# Patient Record
Sex: Female | Born: 1955 | Race: White | Hispanic: No | Marital: Married | Smoking: Former smoker
Health system: Southern US, Community
[De-identification: ages and names within clinical notes are randomized; demographics above are authoritative.]

## PROBLEM LIST (undated history)

## (undated) DIAGNOSIS — G43109 Migraine with aura, not intractable, without status migrainosus: Secondary | ICD-10-CM

## (undated) DIAGNOSIS — K219 Gastro-esophageal reflux disease without esophagitis: Secondary | ICD-10-CM

## (undated) DIAGNOSIS — G4734 Idiopathic sleep related nonobstructive alveolar hypoventilation: Secondary | ICD-10-CM

## (undated) DIAGNOSIS — I10 Essential (primary) hypertension: Secondary | ICD-10-CM

## (undated) DIAGNOSIS — F419 Anxiety disorder, unspecified: Secondary | ICD-10-CM

## (undated) DIAGNOSIS — T8859XA Other complications of anesthesia, initial encounter: Secondary | ICD-10-CM

## (undated) DIAGNOSIS — R112 Nausea with vomiting, unspecified: Secondary | ICD-10-CM

## (undated) DIAGNOSIS — N189 Chronic kidney disease, unspecified: Secondary | ICD-10-CM

## (undated) DIAGNOSIS — M419 Scoliosis, unspecified: Secondary | ICD-10-CM

## (undated) DIAGNOSIS — T7840XA Allergy, unspecified, initial encounter: Secondary | ICD-10-CM

## (undated) DIAGNOSIS — F329 Major depressive disorder, single episode, unspecified: Secondary | ICD-10-CM

## (undated) DIAGNOSIS — I701 Atherosclerosis of renal artery: Secondary | ICD-10-CM

## (undated) DIAGNOSIS — G4730 Sleep apnea, unspecified: Secondary | ICD-10-CM

## (undated) DIAGNOSIS — M4800 Spinal stenosis, site unspecified: Secondary | ICD-10-CM

## (undated) DIAGNOSIS — M1990 Unspecified osteoarthritis, unspecified site: Secondary | ICD-10-CM

## (undated) DIAGNOSIS — E785 Hyperlipidemia, unspecified: Secondary | ICD-10-CM

## (undated) DIAGNOSIS — H269 Unspecified cataract: Secondary | ICD-10-CM

## (undated) DIAGNOSIS — Z9889 Other specified postprocedural states: Secondary | ICD-10-CM

## (undated) DIAGNOSIS — G709 Myoneural disorder, unspecified: Secondary | ICD-10-CM

## (undated) HISTORY — DX: Essential (primary) hypertension: I10

## (undated) HISTORY — DX: Major depressive disorder, single episode, unspecified: F32.9

## (undated) HISTORY — DX: Gastro-esophageal reflux disease without esophagitis: K21.9

## (undated) HISTORY — DX: Sleep apnea, unspecified: G47.30

## (undated) HISTORY — DX: Unspecified cataract: H26.9

## (undated) HISTORY — DX: Idiopathic sleep related nonobstructive alveolar hypoventilation: G47.34

## (undated) HISTORY — DX: Unspecified osteoarthritis, unspecified site: M19.90

## (undated) HISTORY — DX: Atherosclerosis of renal artery: I70.1

## (undated) HISTORY — DX: Spinal stenosis, site unspecified: M48.00

## (undated) HISTORY — DX: Anxiety disorder, unspecified: F41.9

## (undated) HISTORY — DX: Allergy, unspecified, initial encounter: T78.40XA

## (undated) HISTORY — PX: CHOLECYSTECTOMY: SHX55

## (undated) HISTORY — PX: CATARACT EXTRACTION: SUR2

## (undated) HISTORY — DX: Chronic kidney disease, unspecified: N18.9

## (undated) HISTORY — DX: Hyperlipidemia, unspecified: E78.5

## (undated) HISTORY — PX: BUNIONECTOMY: SHX129

## (undated) HISTORY — DX: Migraine with aura, not intractable, without status migrainosus: G43.109

## (undated) HISTORY — PX: EYE SURGERY: SHX253

## (undated) HISTORY — PX: TUBAL LIGATION: SHX77

## (undated) HISTORY — DX: Myoneural disorder, unspecified: G70.9

---

## 2019-10-13 ENCOUNTER — Encounter: Payer: Self-pay | Admitting: Internal Medicine

## 2019-11-27 NOTE — Progress Notes (Deleted)
Referring Provider: Marylynn Pearson, FNP Primary Care Physician:  Pearson Grippe, MD Primary Gastroenterologist:  Dr. Bonnetta Barry chief complaint on file.   HPI:   Erica Gallegos is a 64 y.o. female presenting today at the request of Marylynn Pearson, FNP for esophageal stricture. No past medical history on file.  *** The histories are not reviewed yet. Please review them in the "History" navigator section and refresh this SmartLink.  No current outpatient medications on file.   No current facility-administered medications for this visit.    Allergies as of 11/29/2019  . (Not on File)    No family history on file.  Social History   Socioeconomic History  . Marital status: Not on file    Spouse name: Not on file  . Number of children: Not on file  . Years of education: Not on file  . Highest education level: Not on file  Occupational History  . Not on file  Tobacco Use  . Smoking status: Not on file  Substance and Sexual Activity  . Alcohol use: Not on file  . Drug use: Not on file  . Sexual activity: Not on file  Other Topics Concern  . Not on file  Social History Narrative  . Not on file   Social Determinants of Health   Financial Resource Strain:   . Difficulty of Paying Living Expenses: Not on file  Food Insecurity:   . Worried About Programme researcher, broadcasting/film/video in the Last Year: Not on file  . Ran Out of Food in the Last Year: Not on file  Transportation Needs:   . Lack of Transportation (Medical): Not on file  . Lack of Transportation (Non-Medical): Not on file  Physical Activity:   . Days of Exercise per Week: Not on file  . Minutes of Exercise per Session: Not on file  Stress:   . Feeling of Stress : Not on file  Social Connections:   . Frequency of Communication with Friends and Family: Not on file  . Frequency of Social Gatherings with Friends and Family: Not on file  . Attends Religious Services: Not on file  . Active Member of Clubs or Organizations: Not  on file  . Attends Banker Meetings: Not on file  . Marital Status: Not on file  Intimate Partner Violence:   . Fear of Current or Ex-Partner: Not on file  . Emotionally Abused: Not on file  . Physically Abused: Not on file  . Sexually Abused: Not on file    Review of Systems: Gen: Denies any fever, chills, fatigue, weight loss, lack of appetite.  CV: Denies chest pain, heart palpitations, peripheral edema, syncope.  Resp: Denies shortness of breath at rest or with exertion. Denies wheezing or cough.  GI: Denies dysphagia or odynophagia. Denies jaundice, hematemesis, fecal incontinence. GU : Denies urinary burning, urinary frequency, urinary hesitancy MS: Denies joint pain, muscle weakness, cramps, or limitation of movement.  Derm: Denies rash, itching, dry skin Psych: Denies depression, anxiety, memory loss, and confusion Heme: Denies bruising, bleeding, and enlarged lymph nodes.  Physical Exam: There were no vitals taken for this visit. General:   Alert and oriented. Pleasant and cooperative. Well-nourished and well-developed.  Head:  Normocephalic and atraumatic. Eyes:  Without icterus, sclera clear and conjunctiva pink.  Ears:  Normal auditory acuity. Nose:  No deformity, discharge,  or lesions. Mouth:  No deformity or lesions, oral mucosa pink.  Neck:  Supple, without mass or thyromegaly. Lungs:  Clear  to auscultation bilaterally. No wheezes, rales, or rhonchi. No distress.  Heart:  S1, S2 present without murmurs appreciated.  Abdomen:  +BS, soft, non-tender and non-distended. No HSM noted. No guarding or rebound. No masses appreciated.  Rectal:  Deferred  Msk:  Symmetrical without gross deformities. Normal posture. Pulses:  Normal pulses noted. Extremities:  Without clubbing or edema. Neurologic:  Alert and  oriented x4;  grossly normal neurologically. Skin:  Intact without significant lesions or rashes. Cervical Nodes:  No significant cervical  adenopathy. Psych:  Alert and cooperative. Normal mood and affect.

## 2019-11-29 ENCOUNTER — Encounter: Payer: Self-pay | Admitting: Gastroenterology

## 2019-11-29 ENCOUNTER — Ambulatory Visit: Payer: Self-pay | Admitting: Gastroenterology

## 2020-01-19 DIAGNOSIS — F4310 Post-traumatic stress disorder, unspecified: Secondary | ICD-10-CM | POA: Diagnosis not present

## 2020-01-19 DIAGNOSIS — Z1159 Encounter for screening for other viral diseases: Secondary | ICD-10-CM | POA: Diagnosis not present

## 2020-01-19 DIAGNOSIS — F419 Anxiety disorder, unspecified: Secondary | ICD-10-CM | POA: Diagnosis not present

## 2020-01-19 DIAGNOSIS — Z1152 Encounter for screening for COVID-19: Secondary | ICD-10-CM | POA: Diagnosis not present

## 2020-01-21 ENCOUNTER — Ambulatory Visit: Payer: Self-pay | Admitting: Gastroenterology

## 2020-02-17 DIAGNOSIS — Z79899 Other long term (current) drug therapy: Secondary | ICD-10-CM | POA: Diagnosis not present

## 2020-02-24 DIAGNOSIS — Z79899 Other long term (current) drug therapy: Secondary | ICD-10-CM | POA: Diagnosis not present

## 2020-02-24 DIAGNOSIS — I10 Essential (primary) hypertension: Secondary | ICD-10-CM | POA: Diagnosis not present

## 2020-02-24 DIAGNOSIS — E559 Vitamin D deficiency, unspecified: Secondary | ICD-10-CM | POA: Diagnosis not present

## 2020-02-24 DIAGNOSIS — E7849 Other hyperlipidemia: Secondary | ICD-10-CM | POA: Diagnosis not present

## 2020-02-29 DIAGNOSIS — Z79899 Other long term (current) drug therapy: Secondary | ICD-10-CM | POA: Diagnosis not present

## 2020-02-29 DIAGNOSIS — F331 Major depressive disorder, recurrent, moderate: Secondary | ICD-10-CM | POA: Diagnosis not present

## 2020-02-29 DIAGNOSIS — I10 Essential (primary) hypertension: Secondary | ICD-10-CM | POA: Diagnosis not present

## 2020-02-29 DIAGNOSIS — F419 Anxiety disorder, unspecified: Secondary | ICD-10-CM | POA: Diagnosis not present

## 2020-03-01 NOTE — Progress Notes (Signed)
Referring Provider: Marylynn Pearson, FNP Primary Care Physician:  Pearson Grippe, MD Primary Gastroenterologist:  Dr. Jena Gauss  Chief Complaint  Patient presents with  . Dysphagia    Last EGD approx 10 years ago. Feels like throat is closing up-happening more often    HPI:   Erica Gallegos is a 65 y.o. female presenting today at the request of Marylynn Pearson, FNP for esophageal stricture.  Today:  Feels her throat is closing up. Trouble with swallowing x1 year, but worsening. When eating or drinking, sometimes, items will get stuck at the sternal notch. With time, items will pass. Usually takes about 30 seconds.  No regurgitation. Can occur a couple times a week. No GERD symptoms on Prevacid daily.   No abdominal pain. Little nausea daily x1 month. No identified trigger. Starts around lunch time. Last few a few minutes then it resolves on its own. May come back later in the day. Sometimes improves with eating. Has coffee for breakfast. Takes tylenol for migraines, but also takes either 2 aspirin or 2 ibuprofen a few times a week. Also takes BC powders. Hasn't established with anyone to manage migraines since moving from [place] recently.  She was seeing a neurologist.    EGD about 10 years ago in [place]. Can't remember if she had a dilation. No known history of H. Pylori or PUD. Had a colonoscopy that was normal around 2019. [employer], Dr. Cleotilde Neer.   Reports history of IBS with constipation. BMs every 4-5 days. Doesn't take anything. Not interested in medications. No blood in the stools or black stools. No unintentional weight loss.   BP is elevated. Has a BO cuff at home she unpacked. Hasn't taken medications this morning. Vision is a little blurry with floaters. This happens when she is getting a migraine. No HA at this time. No CP or SOB. Wants to take her medications and see if BP improves. Doesn't want to go to the hospital. Just had an increased in hydrochlorothiazide  yesterday. BP was 170/110 yesterday at PCPs office. Repeat BP 146/90 today.   Past Medical History:  Diagnosis Date  . Anxiety   . GERD (gastroesophageal reflux disease)   . HLD (hyperlipidemia)   . HTN (hypertension)   . Major depressive disorder   . Migraine with aura   . RAS (renal artery stenosis) (HCC)   . Sleep apnea   . Sleep related hypoxia   . Spinal stenosis     Past Surgical History:  Procedure Laterality Date  . CHOLECYSTECTOMY      Current Outpatient Medications  Medication Sig Dispense Refill  . atorvastatin (LIPITOR) 40 MG tablet Take 40 mg by mouth daily.    Marland Kitchen buPROPion (WELLBUTRIN SR) 200 MG 12 hr tablet Take 200 mg by mouth 2 (two) times daily.    . carvedilol (COREG) 25 MG tablet Take 25 mg by mouth 2 (two) times daily.    . clonazePAM (KLONOPIN) 0.5 MG tablet Take 0.5 mg by mouth daily as needed.    . cyclobenzaprine (FLEXERIL) 10 MG tablet Take 10 mg by mouth at bedtime as needed.    . DULoxetine (CYMBALTA) 30 MG capsule Take 30 mg by mouth daily. Takes 30mg  + 60mg  to equal 90mg     . DULoxetine (CYMBALTA) 60 MG capsule Take 60 mg by mouth daily. Takes 30mg  + 60mg  to equal 90mg     . gabapentin (NEURONTIN) 300 MG capsule Take 1-2 capsules by mouth 3 (three) times daily. 1 capsule in morning, 1 capsule  at noon, 2 capsules at night    . hydrochlorothiazide (HYDRODIURIL) 25 MG tablet Take 50 mg by mouth daily.    Marland Kitchen lamoTRIgine (LAMICTAL) 25 MG tablet Take 50 mg by mouth every morning.    . lansoprazole (PREVACID) 30 MG capsule Take 1 capsule (30 mg total) by mouth 2 (two) times daily before a meal. 60 capsule 3  . lisinopril (ZESTRIL) 40 MG tablet Take 40 mg by mouth daily.    . melatonin 5 MG TABS Take 5 mg by mouth at bedtime as needed.    . Omega-3 Fatty Acids (FISH OIL) 1000 MG CAPS Take 1 capsule by mouth daily.    . Vitamin D, Ergocalciferol, (DRISDOL) 1.25 MG (50000 UNIT) CAPS capsule Take 50,000 Units by mouth once a week.     No current  facility-administered medications for this visit.    Allergies as of 03/02/2020  . (No Known Allergies)    Family History  Problem Relation Age of Onset  . Colon cancer Neg Hx     Social History   Socioeconomic History  . Marital status: Married    Spouse name: Not on file  . Number of children: Not on file  . Years of education: Not on file  . Highest education level: Not on file  Occupational History  . Not on file  Tobacco Use  . Smoking status: Current Some Day Smoker  . Smokeless tobacco: Never Used  . Tobacco comment: vapes occ  Substance and Sexual Activity  . Alcohol use: Yes    Comment: seldom  . Drug use: Yes    Types: Marijuana    Comment: CBD cream; Marijuana a couple times a week.   Marland Kitchen Sexual activity: Not on file  Other Topics Concern  . Not on file  Social History Narrative  . Not on file   Social Determinants of Health   Financial Resource Strain: Not on file  Food Insecurity: Not on file  Transportation Needs: Not on file  Physical Activity: Not on file  Stress: Not on file  Social Connections: Not on file  Intimate Partner Violence: Not on file    Review of Systems: Gen: Denies any fever, chills, cold or flulike symptoms, lightheadedness, dizziness, presyncope, syncope. CV: See HPI Resp: See HPI GI: See HPI GU : Denies urinary burning, urinary frequency, urinary hesitancy MS: Denies joint pain Derm: Denies rash Psych: Admits to history of anxiety and depression, currently managed with medications. Heme: See HPI  Physical Exam: BP (!) 146/90 (BP Location: Right Arm)   Pulse 67   Temp (!) 96.8 F (36 C) (Temporal)   Ht 5\' 5"  (1.651 m)   Wt 179 lb (81.2 kg)   BMI 29.79 kg/m  General:   Alert and oriented. Pleasant and cooperative. Well-nourished and well-developed.  Head:  Normocephalic and atraumatic. Eyes:  Without icterus, sclera clear and conjunctiva pink.  Ears:  Normal auditory acuity. Lungs:  Clear to auscultation  bilaterally. No wheezes, rales, or rhonchi. No distress.  Heart:  S1, S2 present without murmurs appreciated.  Abdomen:  +BS, soft, non-tender and non-distended. No HSM noted. No guarding or rebound. No masses appreciated.  Rectal:  Deferred  Msk:  Symmetrical without gross deformities. Normal posture. Extremities:  With 1+ bilateral lower extremity pitting edema. Neurologic:  Alert and  oriented x4;  grossly normal neurologically. Skin:  Intact without significant lesions or rashes. Psych: Normal mood and affect.  Assessment: 65 year old female recently moved from 70 with history of HTN,  HLD, RAS, sleep apnea, anxiety/depression, constipation, GERD, and regular marijuana use presenting today with complaint of dysphagia and nausea without vomiting.  Also with significantly elevated blood pressure at 207/102 on presentation.  Dysphagia: 1 year history of intermittent food or liquid dysphagia that is becoming more frequent.  Feels items get stuck at the sternal notch, but will pass with time.  No regurgitation.  History of GERD that is well controlled on Prevacid daily.  She does consume several NSAID products throughout the week due to history of migraines including aspirin, ibuprofen, and BC powders.  Differentials include esophagitis secondary to NSAIDs, esophageal web, ring, or stricture less likely malignancy. She is going to need EGD, but would like to see that her blood pressure is under better control prior to scheduling.   Nausea without vomiting: Mild daily nausea without vomiting x1 month without identified trigger.  GERD well controlled on Prevacid daily.  She consumes several NSAID products throughout the week due to migraines.  Differentials include gastritis, duodenitis, PUD, H. Pylori, and atypical GERD..  HTN: Blood pressure significantly elevated at 207/102 on presentation.  Patient reports she has not taken her medications this morning and just saw her PCP yesterday to  adjust blood pressure medications as her blood pressure has been persistently high.  States it was 170/110 yesterday.  Her hydrochlorothiazide was doubled.  On recheck just before patient left the office, BP was 146/90.  That she keep a close check on her blood pressure at home and contact PCP if her blood pressure continues to be elevated.  We are looking at scheduling EGD in the near future.  We will have her come back in 2 weeks for blood pressure recheck to see if medication change has helped stabilize HTN.  Plan:  1.  Increase Prevacid to 30 mg twice daily. 2.  Avoid all NSAID products. 3.  Discuss management of migraines with primary care while she is trying to find a neurologist. 4.  Take blood pressure medications as prescribed. 5.  Check blood pressure at home daily and contact PCP if persistent elevation. 6.  Advised to return to our office in 2 weeks for blood pressure recheck.   7.  If blood pressure is under better control with recent medication adjustment, we will proceed with EGD with propofol with Dr. Jena Gauss. ASA II. 8.  Advised to eat slowly, take small bites, chew thoroughly, drink plenty of liquids throughout her meals. 9.  Discussed ED precautions regarding HTN and dysphagia.

## 2020-03-02 ENCOUNTER — Other Ambulatory Visit: Payer: Self-pay

## 2020-03-02 ENCOUNTER — Ambulatory Visit (INDEPENDENT_AMBULATORY_CARE_PROVIDER_SITE_OTHER): Payer: BC Managed Care – PPO | Admitting: Gastroenterology

## 2020-03-02 ENCOUNTER — Encounter: Payer: Self-pay | Admitting: Gastroenterology

## 2020-03-02 VITALS — BP 146/90 | HR 67 | Temp 96.8°F | Ht 65.0 in | Wt 179.0 lb

## 2020-03-02 DIAGNOSIS — R110 Nausea: Secondary | ICD-10-CM | POA: Diagnosis not present

## 2020-03-02 DIAGNOSIS — R1310 Dysphagia, unspecified: Secondary | ICD-10-CM | POA: Insufficient documentation

## 2020-03-02 DIAGNOSIS — I10 Essential (primary) hypertension: Secondary | ICD-10-CM | POA: Diagnosis not present

## 2020-03-02 DIAGNOSIS — K219 Gastro-esophageal reflux disease without esophagitis: Secondary | ICD-10-CM | POA: Insufficient documentation

## 2020-03-02 MED ORDER — LANSOPRAZOLE 30 MG PO CPDR: 30.0000 mg | DELAYED_RELEASE_CAPSULE | Freq: Two times a day (BID) | ORAL | 3 refills | Status: DC

## 2020-03-02 NOTE — Patient Instructions (Addendum)
Please check your blood pressure in 1 hour at home and if your blood pressure remains elevated over 180/100, please proceed to the emergency room.   If you develop any chest pain, shortness of breath, worsening blurry vision, change in mental status, or headache, you need to proceed to the emergency room.  Continue to follow closely with your primary care for elevated blood pressure.  Increase Prevacid to 30 mg twice daily 30 minutes before breakfast and dinner.  I have sent a prescription to your pharmacy.  Please avoid all NSAID products including ibuprofen, Aleve, Advil, Goody powders, BC powders, naproxen, and anything that says "NSAID" on the package.  Discussed management of migraines with primary care for now while you are trying to find a neurologist.  For trouble swallowing, eat slowly, take small bites, chew thoroughly, and drink plenty of liquids throughout your meals.  If anything gets stuck in your esophagus and will not come up or go down after a couple of hours, you should proceed to the emergency room.  We will hope to schedule you for an EGD in the near future.  Please come back to our office in 2 weeks for a nurse visit to recheck your blood pressure.  If blood pressure is improved, we will proceed with EGD.  It was nice to meet you today!  Ermalinda Memos, PA-C [facility]

## 2020-03-08 DIAGNOSIS — I10 Essential (primary) hypertension: Secondary | ICD-10-CM | POA: Diagnosis not present

## 2020-03-08 DIAGNOSIS — Z20822 Contact with and (suspected) exposure to covid-19: Secondary | ICD-10-CM | POA: Diagnosis not present

## 2020-03-21 DIAGNOSIS — F331 Major depressive disorder, recurrent, moderate: Secondary | ICD-10-CM | POA: Diagnosis not present

## 2020-03-21 DIAGNOSIS — F4310 Post-traumatic stress disorder, unspecified: Secondary | ICD-10-CM | POA: Diagnosis not present

## 2020-03-21 DIAGNOSIS — F419 Anxiety disorder, unspecified: Secondary | ICD-10-CM | POA: Diagnosis not present

## 2020-03-21 DIAGNOSIS — I10 Essential (primary) hypertension: Secondary | ICD-10-CM | POA: Diagnosis not present

## 2020-05-02 DIAGNOSIS — R0602 Shortness of breath: Secondary | ICD-10-CM | POA: Diagnosis not present

## 2020-05-02 DIAGNOSIS — F419 Anxiety disorder, unspecified: Secondary | ICD-10-CM | POA: Diagnosis not present

## 2020-05-02 DIAGNOSIS — I10 Essential (primary) hypertension: Secondary | ICD-10-CM | POA: Diagnosis not present

## 2020-05-02 DIAGNOSIS — R600 Localized edema: Secondary | ICD-10-CM | POA: Diagnosis not present

## 2020-05-09 DIAGNOSIS — F4310 Post-traumatic stress disorder, unspecified: Secondary | ICD-10-CM | POA: Diagnosis not present

## 2020-05-09 DIAGNOSIS — F422 Mixed obsessional thoughts and acts: Secondary | ICD-10-CM | POA: Diagnosis not present

## 2020-05-09 DIAGNOSIS — F4312 Post-traumatic stress disorder, chronic: Secondary | ICD-10-CM | POA: Diagnosis not present

## 2020-05-09 DIAGNOSIS — F329 Major depressive disorder, single episode, unspecified: Secondary | ICD-10-CM | POA: Diagnosis not present

## 2020-05-09 DIAGNOSIS — F319 Bipolar disorder, unspecified: Secondary | ICD-10-CM | POA: Diagnosis not present

## 2020-05-10 DIAGNOSIS — R600 Localized edema: Secondary | ICD-10-CM | POA: Diagnosis not present

## 2020-05-10 DIAGNOSIS — I10 Essential (primary) hypertension: Secondary | ICD-10-CM | POA: Diagnosis not present

## 2020-05-10 DIAGNOSIS — R0602 Shortness of breath: Secondary | ICD-10-CM | POA: Diagnosis not present

## 2020-05-10 DIAGNOSIS — F331 Major depressive disorder, recurrent, moderate: Secondary | ICD-10-CM | POA: Diagnosis not present

## 2020-05-23 DIAGNOSIS — I10 Essential (primary) hypertension: Secondary | ICD-10-CM | POA: Diagnosis not present

## 2020-05-23 DIAGNOSIS — R5383 Other fatigue: Secondary | ICD-10-CM | POA: Diagnosis not present

## 2020-05-23 DIAGNOSIS — Z79899 Other long term (current) drug therapy: Secondary | ICD-10-CM | POA: Diagnosis not present

## 2020-06-06 ENCOUNTER — Other Ambulatory Visit: Payer: Self-pay

## 2020-06-06 ENCOUNTER — Encounter (HOSPITAL_COMMUNITY): Payer: Self-pay | Admitting: *Deleted

## 2020-06-06 ENCOUNTER — Emergency Department (HOSPITAL_COMMUNITY)
Admission: EM | Admit: 2020-06-06 | Discharge: 2020-06-07 | Disposition: A | Discharge status: Home / Self Care | Payer: BC Managed Care – PPO | Attending: Emergency Medicine | Admitting: Emergency Medicine

## 2020-06-06 DIAGNOSIS — I10 Essential (primary) hypertension: Secondary | ICD-10-CM | POA: Insufficient documentation

## 2020-06-06 DIAGNOSIS — Z79899 Other long term (current) drug therapy: Secondary | ICD-10-CM | POA: Insufficient documentation

## 2020-06-06 DIAGNOSIS — F17200 Nicotine dependence, unspecified, uncomplicated: Secondary | ICD-10-CM | POA: Diagnosis not present

## 2020-06-06 DIAGNOSIS — H538 Other visual disturbances: Secondary | ICD-10-CM | POA: Diagnosis not present

## 2020-06-06 DIAGNOSIS — H539 Unspecified visual disturbance: Secondary | ICD-10-CM

## 2020-06-06 DIAGNOSIS — F4310 Post-traumatic stress disorder, unspecified: Secondary | ICD-10-CM | POA: Diagnosis not present

## 2020-06-06 DIAGNOSIS — R519 Headache, unspecified: Secondary | ICD-10-CM | POA: Diagnosis not present

## 2020-06-06 NOTE — ED Triage Notes (Signed)
Pt c/o headache that started x one hour ago; pt states the pain is at the back of her head; pt states she has been taking her BP meds as prescribed

## 2020-06-06 NOTE — ED Notes (Signed)
Pt tearful and crying, states "I thought I was done with all this mess".

## 2020-06-06 NOTE — ED Notes (Signed)
Attempted IV x 1, unsuccessful, pt requests "a break".

## 2020-06-07 NOTE — ED Provider Notes (Signed)
[facility] EMERGENCY DEPARTMENT Provider Note   CSN: [id number] Arrival date & time: 06/06/20  2218     History Chief Complaint  Patient presents with  . Headache    Back of head    Erica Gallegos is a 65 y.o. female.   Eye Problem Location:  Right eye Quality: feeling of a string in her vision, like an aura for an optical migraine but no h/o this type of string. . Severity:  Mild Duration:  2 hours Timing:  Constant Progression:  Resolved Chronicity:  New Context: not burn   Relieved by:  None tried Worsened by:  Nothing      Past Medical History:  Diagnosis Date  . Anxiety   . GERD (gastroesophageal reflux disease)   . HLD (hyperlipidemia)   . HTN (hypertension)   . Major depressive disorder   . Migraine with aura   . RAS (renal artery stenosis) (HCC)   . Sleep apnea   . Sleep related hypoxia   . Spinal stenosis     Patient Active Problem List   Diagnosis Date Noted  . Dysphagia 03/02/2020  . Hypertension 03/02/2020  . Nausea without vomiting 03/02/2020  . Gastroesophageal reflux disease 03/02/2020    Past Surgical History:  Procedure Laterality Date  . CHOLECYSTECTOMY       OB History   No obstetric history on file.     Family History  Problem Relation Age of Onset  . Colon cancer Neg Hx     Social History   Tobacco Use  . Smoking status: Current Some Day Smoker  . Smokeless tobacco: Never Used  . Tobacco comment: vapes occ  Substance Use Topics  . Alcohol use: Yes    Comment: seldom  . Drug use: Yes    Types: Marijuana    Comment: CBD cream; Marijuana a couple times a week.     Home Medications Prior to Admission medications   Medication Sig Start Date End Date Taking? Authorizing Provider  atorvastatin (LIPITOR) 40 MG tablet Take 40 mg by mouth daily.    [provider]  buPROPion (WELLBUTRIN SR) 200 MG 12 hr tablet Take 200 mg by mouth 2 (two) times daily. 12/24/19   [provider]  carvedilol  (COREG) 25 MG tablet Take 25 mg by mouth 2 (two) times daily. 01/31/20   [provider]  clonazePAM (KLONOPIN) 0.5 MG tablet Take 0.5 mg by mouth daily as needed. 02/29/20   [provider]  cyclobenzaprine (FLEXERIL) 10 MG tablet Take 10 mg by mouth at bedtime as needed. 02/08/20   [provider]  DULoxetine (CYMBALTA) 30 MG capsule Take 30 mg by mouth daily. Takes 30mg  + 60mg  to equal 90mg  02/14/20   [provider]  DULoxetine (CYMBALTA) 60 MG capsule Take 60 mg by mouth daily. Takes 30mg  + 60mg  to equal 90mg  02/14/20   [provider]  gabapentin (NEURONTIN) 300 MG capsule Take 1-2 capsules by mouth 3 (three) times daily. 1 capsule in morning, 1 capsule at noon, 2 capsules at night 02/23/20   [provider]  hydrochlorothiazide (HYDRODIURIL) 25 MG tablet Take 50 mg by mouth daily. 12/22/19   [provider]  lamoTRIgine (LAMICTAL) 25 MG tablet Take 50 mg by mouth every morning. 02/08/20   [provider]  lansoprazole (PREVACID) 30 MG capsule Take 1 capsule (30 mg total) by mouth 2 (two) times daily before a meal. 03/02/20   , PA-C  lisinopril (ZESTRIL) 40 MG  tablet Take 40 mg by mouth daily. 02/23/20   [provider]  melatonin 5 MG TABS Take 5 mg by mouth at bedtime as needed.    [provider]  Omega-3 Fatty Acids (FISH OIL) 1000 MG CAPS Take 1 capsule by mouth daily. 11/24/19   [provider]  Vitamin D, Ergocalciferol, (DRISDOL) 1.25 MG (50000 UNIT) CAPS capsule Take 50,000 Units by mouth once a week. 02/10/20   [provider]    Allergies    Patient has no known allergies.  Review of Systems   Review of Systems  All other systems reviewed and are negative.   Physical Exam Updated Vital Signs BP (!) 163/101   Pulse (!) 55   Temp 98 F (36.7 C) (Oral)   Resp 19   Ht 5\' 6"  (1.676 m)   Wt 83.9 kg   SpO2 97%   BMI 29.86 kg/m   Physical Exam Vitals and nursing  note reviewed.  Constitutional:      Appearance: She is well-developed.  HENT:     Head: Normocephalic and atraumatic.  Eyes:     General: No visual field deficit. Cardiovascular:     Rate and Rhythm: Normal rate and regular rhythm.  Pulmonary:     Effort: No respiratory distress.     Breath sounds: No stridor.  Abdominal:     General: There is no distension.  Musculoskeletal:     Cervical back: Normal range of motion.  Neurological:     Mental Status: She is alert.     GCS: GCS eye subscore is 4. GCS verbal subscore is 5. GCS motor subscore is 6.     Cranial Nerves: No cranial nerve deficit, dysarthria or facial asymmetry.     Sensory: No sensory deficit.     Motor: No weakness.     Coordination: Romberg sign negative. Coordination normal.     Gait: Gait normal.     ED Results / Procedures / Treatments   Labs (all labs ordered are listed, but only abnormal results are displayed) Labs Reviewed - No data to display  EKG None  Radiology No results found.  Procedures Procedures   Medications Ordered in ED Medications - No data to display  ED Course  I have reviewed the triage vital signs and the nursing notes.  Pertinent labs & imaging results that were available during my care of the patient were reviewed by me and considered in my medical decision making (see chart for details).    MDM Rules/Calculators/A&P                          History of optic migraines however this is little bit different.  This was also associated with high blood pressure is not used to.  She was worried for stroke scheduled for evaluation.  No numbness tingling or weakness anywhere.  She states that her symptoms have since pretty much resolved.  I discussed the option of doing a CT however I do think was high-yield with her short duration of symptoms and the fact that they have already resolved.  Also offered headache cocktail however she stated that this point that her symptoms are so  improved and in fact she did not want to wait for that. Overall symptoms seem c/w migraine type and not retinal detachment, glaucoma, emporal arteritis, stroke, intracranial lesion or other emergent causes so will dc to follow up with outpatient providers.   Final Clinical Impression(s) /  ED Diagnoses Final diagnoses:  Vision changes  Hypertension, unspecified type    Rx / DC Orders ED Discharge Orders    None       Aemon Koeller, Barbara Cower, MD 06/07/20 [phone]

## 2020-06-13 DIAGNOSIS — H35363 Drusen (degenerative) of macula, bilateral: Secondary | ICD-10-CM | POA: Diagnosis not present

## 2020-06-13 DIAGNOSIS — I10 Essential (primary) hypertension: Secondary | ICD-10-CM | POA: Diagnosis not present

## 2020-06-27 DIAGNOSIS — H26492 Other secondary cataract, left eye: Secondary | ICD-10-CM | POA: Diagnosis not present

## 2020-06-27 DIAGNOSIS — H538 Other visual disturbances: Secondary | ICD-10-CM | POA: Diagnosis not present

## 2020-07-04 DIAGNOSIS — F4310 Post-traumatic stress disorder, unspecified: Secondary | ICD-10-CM | POA: Diagnosis not present

## 2020-07-13 DIAGNOSIS — I10 Essential (primary) hypertension: Secondary | ICD-10-CM | POA: Diagnosis not present

## 2020-07-13 DIAGNOSIS — R5383 Other fatigue: Secondary | ICD-10-CM | POA: Diagnosis not present

## 2020-07-18 DIAGNOSIS — Z79899 Other long term (current) drug therapy: Secondary | ICD-10-CM | POA: Diagnosis not present

## 2020-07-18 DIAGNOSIS — Z1231 Encounter for screening mammogram for malignant neoplasm of breast: Secondary | ICD-10-CM | POA: Diagnosis not present

## 2020-07-18 DIAGNOSIS — I10 Essential (primary) hypertension: Secondary | ICD-10-CM | POA: Diagnosis not present

## 2020-07-18 DIAGNOSIS — R600 Localized edema: Secondary | ICD-10-CM | POA: Diagnosis not present

## 2020-07-26 DIAGNOSIS — H538 Other visual disturbances: Secondary | ICD-10-CM | POA: Diagnosis not present

## 2020-08-07 ENCOUNTER — Other Ambulatory Visit (HOSPITAL_COMMUNITY): Payer: Self-pay | Admitting: Family Medicine

## 2020-08-07 DIAGNOSIS — Z1231 Encounter for screening mammogram for malignant neoplasm of breast: Secondary | ICD-10-CM

## 2020-08-07 DIAGNOSIS — F4310 Post-traumatic stress disorder, unspecified: Secondary | ICD-10-CM | POA: Diagnosis not present

## 2020-08-14 DIAGNOSIS — F319 Bipolar disorder, unspecified: Secondary | ICD-10-CM | POA: Diagnosis not present

## 2020-08-15 DIAGNOSIS — D519 Vitamin B12 deficiency anemia, unspecified: Secondary | ICD-10-CM | POA: Diagnosis not present

## 2020-08-18 ENCOUNTER — Encounter: Payer: Self-pay | Admitting: Cardiology

## 2020-08-18 ENCOUNTER — Ambulatory Visit (INDEPENDENT_AMBULATORY_CARE_PROVIDER_SITE_OTHER): Payer: BC Managed Care – PPO | Admitting: Cardiology

## 2020-08-18 ENCOUNTER — Other Ambulatory Visit: Payer: Self-pay

## 2020-08-18 VITALS — BP 150/68 | HR 52 | Ht 66.0 in | Wt 178.0 lb

## 2020-08-18 DIAGNOSIS — I10 Essential (primary) hypertension: Secondary | ICD-10-CM

## 2020-08-18 DIAGNOSIS — G4730 Sleep apnea, unspecified: Secondary | ICD-10-CM | POA: Diagnosis not present

## 2020-08-18 MED ORDER — CHLORTHALIDONE 25 MG PO TABS: 25.0000 mg | ORAL_TABLET | Freq: Every day | ORAL | 6 refills | Status: DC

## 2020-08-18 NOTE — Progress Notes (Signed)
Clinical Summary Erica Gallegos is a 65 y.o.female seen as new consult, referred by Dr Selena Batten for the following medical problems   Resistant HTN - recent issues with high bp's, SBPs up to 200s at times - compliant with meds - avoiding NSAIDs - some fatigue with clonidine since starting.     -pcp notes list renal artery stenosis in PMH, detaisl are unclear - she recalls was told mild some years ago.     2. OSA - does wear cpap, has not been evaluated in quite some time.  Past Medical History:  Diagnosis Date   Anxiety    GERD (gastroesophageal reflux disease)    HLD (hyperlipidemia)    HTN (hypertension)    Major depressive disorder    Migraine with aura    RAS (renal artery stenosis) (HCC)    Sleep apnea    Sleep related hypoxia    Spinal stenosis      No Known Allergies   Current Outpatient Medications  Medication Sig Dispense Refill   atorvastatin (LIPITOR) 40 MG tablet Take 40 mg by mouth daily.     buPROPion (WELLBUTRIN SR) 200 MG 12 hr tablet Take 200 mg by mouth 2 (two) times daily.     carvedilol (COREG) 25 MG tablet Take 25 mg by mouth 2 (two) times daily.     clonazePAM (KLONOPIN) 0.5 MG tablet Take 0.5 mg by mouth daily as needed.     cyclobenzaprine (FLEXERIL) 10 MG tablet Take 10 mg by mouth at bedtime as needed.     DULoxetine (CYMBALTA) 30 MG capsule Take 30 mg by mouth daily. Takes 30mg  + 60mg  to equal 90mg      DULoxetine (CYMBALTA) 60 MG capsule Take 60 mg by mouth daily. Takes 30mg  + 60mg  to equal 90mg      gabapentin (NEURONTIN) 300 MG capsule Take 1-2 capsules by mouth 3 (three) times daily. 1 capsule in morning, 1 capsule at noon, 2 capsules at night     hydrochlorothiazide (HYDRODIURIL) 25 MG tablet Take 50 mg by mouth daily.     lamoTRIgine (LAMICTAL) 25 MG tablet Take 50 mg by mouth every morning.     lansoprazole (PREVACID) 30 MG capsule Take 1 capsule (30 mg total) by mouth 2 (two) times daily before a meal. 60 capsule 3   lisinopril  (ZESTRIL) 40 MG tablet Take 40 mg by mouth daily.     melatonin 5 MG TABS Take 5 mg by mouth at bedtime as needed.     Omega-3 Fatty Acids (FISH OIL) 1000 MG CAPS Take 1 capsule by mouth daily.     Vitamin D, Ergocalciferol, (DRISDOL) 1.25 MG (50000 UNIT) CAPS capsule Take 50,000 Units by mouth once a week.     No current facility-administered medications for this visit.     Past Surgical History:  Procedure Laterality Date   CHOLECYSTECTOMY       No Known Allergies    Family History  Problem Relation Age of Onset   Colon cancer Neg Hx      Social History Ms. Pfister reports that she has been smoking. She has never used smokeless tobacco. Ms. Esteve reports current alcohol use.   Review of Systems CONSTITUTIONAL: No weight loss, fever, chills, weakness or fatigue.  HEENT: Eyes: No visual loss, blurred vision, double vision or yellow sclerae.No hearing loss, sneezing, congestion, runny nose or sore throat.  SKIN: No rash or itching.  CARDIOVASCULAR: per hpi RESPIRATORY: No shortness of breath, cough or sputum.  GASTROINTESTINAL: No anorexia, nausea, vomiting or diarrhea. No abdominal pain or blood.  GENITOURINARY: No burning on urination, no polyuria NEUROLOGICAL: No headache, dizziness, syncope, paralysis, ataxia, numbness or tingling in the extremities. No change in bowel or bladder control.  MUSCULOSKELETAL: No muscle, back pain, joint pain or stiffness.  LYMPHATICS: No enlarged nodes. No history of splenectomy.  PSYCHIATRIC: No history of depression or anxiety.  ENDOCRINOLOGIC: No reports of sweating, cold or heat intolerance. No polyuria or polydipsia.  Marland Kitchen   Physical Examination Today's Vitals   08/18/20 1038  BP: (!) 150/68  Pulse: (!) 52  SpO2: 98%  Weight: 178 lb (80.7 kg)  Height: 5\' 6"  (1.676 m)   Body mass index is 28.73 kg/m.  Gen: resting comfortably, no acute distress HEENT: no scleral icterus, pupils equal round and reactive, no palptable  cervical adenopathy,  CV: RRR, no m/r/g, no jvd Resp: Clear to auscultation bilaterally GI: abdomen is soft, non-tender, non-distended, normal bowel sounds, no hepatosplenomegaly MSK: extremities are warm, no edema.  Skin: warm, no rash Neuro:  no focal deficits Psych: appropriate affect     Assessment and Plan  Resistant HTN - ongoing issues with HTN,on multiple meds - fatigue with clonidine, also HRs to day in the 50s. Will wean clonidine 0.1mg  bid x 3 days, then 0.1mg  daily x 3 days then stop - change HCTZ to chlorthalidone 25mg  daily for more potent bp effect - in setting of resistant HTN check renin/aldo levels. Some reported history of renal artery stenosis, unclear details, will repeat renal artery . Have her establish with pulmonary for reevaluaton of her sleep apnea - check bmet/mg 2 weeks with diuretic change  - as next med would start aldactone 25mg  daily pending labs - other options over time would be to change coreg to labetalol. Possibly higher dose of chlorthalidone.    F/u 6 weeks  , M.D.

## 2020-08-18 NOTE — Patient Instructions (Addendum)
Medication Instructions:  Decrease Clonidine to 0.1mg  twice a day x 3 days, then 0.1mg  daily x 3 days, then stop.   Stop Hydrochlorothiazide (HCTZ).  Begin Chlorthalidone 25mg  daily.  Continue all other medications.    Labwork: Renin/Aldosterone ratio, TSH, BMET, Mg - orders given today.  Please do in 2 weeks (around 09/01/2020).    Testing/Procedures: Your physician has requested that you have a renal artery duplex. During this test, an ultrasound is used to evaluate blood flow to the kidneys. Allow one hour for this exam. Do not eat after midnight the day before and avoid carbonated beverages. Take your medications as you usually do.   Follow-Up: 6 weeks   Any Other Special Instructions Will Be Listed Below (If Applicable). You have been referred to:  Pulmonology  Call office in 2 weeks with BP readings.   If you need a refill on your cardiac medications before your next appointment, please call your pharmacy.

## 2020-08-22 ENCOUNTER — Other Ambulatory Visit: Payer: Self-pay | Admitting: *Deleted

## 2020-08-22 ENCOUNTER — Telehealth: Payer: Self-pay | Admitting: Cardiology

## 2020-08-22 DIAGNOSIS — I10 Essential (primary) hypertension: Secondary | ICD-10-CM

## 2020-08-22 NOTE — Telephone Encounter (Signed)
Pre-cert Verification for the following procedure     DATE:   08/29/2020  LOCATION: [place]  US RENAL

## 2020-08-25 DIAGNOSIS — F4310 Post-traumatic stress disorder, unspecified: Secondary | ICD-10-CM | POA: Diagnosis not present

## 2020-08-28 ENCOUNTER — Ambulatory Visit: Payer: BC Managed Care – PPO | Admitting: Adult Health

## 2020-08-28 ENCOUNTER — Encounter: Payer: Self-pay | Admitting: Adult Health

## 2020-08-28 ENCOUNTER — Telehealth: Payer: Self-pay

## 2020-08-28 ENCOUNTER — Other Ambulatory Visit: Payer: Self-pay

## 2020-08-28 ENCOUNTER — Other Ambulatory Visit (HOSPITAL_COMMUNITY)
Admission: RE | Admit: 2020-08-28 | Discharge: 2020-08-28 | Disposition: A | Discharge status: Home / Self Care | Payer: BC Managed Care – PPO | Source: Ambulatory Visit | Attending: Adult Health | Admitting: Adult Health

## 2020-08-28 VITALS — BP 183/95 | HR 55 | Ht 66.0 in | Wt 173.6 lb

## 2020-08-28 DIAGNOSIS — Z01419 Encounter for gynecological examination (general) (routine) without abnormal findings: Secondary | ICD-10-CM

## 2020-08-28 DIAGNOSIS — Z1211 Encounter for screening for malignant neoplasm of colon: Secondary | ICD-10-CM | POA: Diagnosis not present

## 2020-08-28 DIAGNOSIS — I10 Essential (primary) hypertension: Secondary | ICD-10-CM | POA: Diagnosis not present

## 2020-08-28 DIAGNOSIS — E785 Hyperlipidemia, unspecified: Secondary | ICD-10-CM | POA: Diagnosis not present

## 2020-08-28 LAB — BASIC METABOLIC PANEL
BUN/Creatinine Ratio: 16 (calc) (ref 6–22)
BUN: 18 mg/dL (ref 7–25)
CO2: 31 mmol/L (ref 20–32)
Calcium: 10.3 mg/dL (ref 8.6–10.4)
Chloride: 103 mmol/L (ref 98–110)
Creat: 1.12 mg/dL — ABNORMAL HIGH (ref 0.50–1.05)
Glucose, Bld: 90 mg/dL (ref 65–139)
Potassium: 4.2 mmol/L (ref 3.5–5.3)
Sodium: 141 mmol/L (ref 135–146)

## 2020-08-28 LAB — HEMOCCULT GUIAC POC 1CARD (OFFICE): Fecal Occult Blood, POC: NEGATIVE

## 2020-08-28 LAB — MAGNESIUM: Magnesium: 2 mg/dL (ref 1.5–2.5)

## 2020-08-28 LAB — TSH: TSH: 1.93 mIU/L (ref 0.40–4.50)

## 2020-08-28 NOTE — Addendum Note (Signed)
Addended by: Kerney Elbe on: 08/28/2020 12:08 PM   Modules accepted: Orders

## 2020-08-28 NOTE — Telephone Encounter (Signed)
-  ABI order

## 2020-08-28 NOTE — Progress Notes (Signed)
Patient ID: Erica Gallegos, female   DOB: [birth year], 65 y.o.   MRN: [id number] History of Present Illness: Erica Gallegos is a 65 year old white female, married, PM in for pelvic and pap. PCP is [facility]   Current Medications, Allergies, Past Medical History, Past Surgical History, Family History and Social History were reviewed in Erica Gallegos record.     Review of Systems:  Patient denies any daily headaches, hearing loss, fatigue, blurred vision, shortness of breath, chest pain, abdominal pain, problems with urination, or intercourse. No mood swings. +joint pain at times, and constipation at times.   Physical Exam:BP (!) 183/95 (BP Location: Left Arm, Patient Position: Sitting, Cuff Size: Normal)   Pulse (!) 55   Ht 5\' 6"  (1.676 m)   Wt 173 lb 9.6 oz (78.7 kg)   BMI 28.02 kg/m   General:  Well developed, well nourished, no acute distress Skin:  Warm and dry Lungs; Clear to auscultation bilaterally Cardiovascular: Regular rate and rhythm Pelvic:  External genitalia is normal in appearance, no lesions.  The vagina is pale with loss of moisture and rugae. Urethra has no lesions or masses. The cervix is smooth, pap with HR HPV genotyping performed.Uterus is felt to be normal size, shape, and contour.  No adnexal masses or tenderness noted.Bladder is non tender, no masses felt. Rectal: Good sphincter tone, no polyps, or hemorrhoids felt.  Hemoccult negative. Extremities/musculoskeletal:  No swelling or varicosities noted, no clubbing or cyanosis Psych:  No mood changes, alert and cooperative,seems happy She refused AA,PHQ 9 and GAD 7 Fall risk is low  Upstream - 08/28/20 1116       Pregnancy Intention Screening   Does the patient want to become pregnant in the next year? No    Does the patient's partner want to become pregnant in the next year? No    Would the patient like to discuss contraceptive options today? No      Contraception Wrap Up   Current Method  --   PM   End Method --   PM   Contraception Counseling Provided No            Examination chaperoned by 08/30/20 LPN  Impression and Plan: 1. Encounter for gynecological examination with Papanicolaou smear of cervix Pap sent Pap in 3 years if normal Physical with PCP Labs with PCP  Colonoscopy per GI Get mammogram, order in from PCP  2. Encounter for screening fecal occult blood testing

## 2020-08-29 ENCOUNTER — Ambulatory Visit (HOSPITAL_COMMUNITY): Payer: BC Managed Care – PPO

## 2020-08-29 ENCOUNTER — Ambulatory Visit (HOSPITAL_COMMUNITY): Admission: RE | Admit: 2020-08-29 | Payer: BC Managed Care – PPO | Source: Ambulatory Visit

## 2020-08-31 ENCOUNTER — Ambulatory Visit (HOSPITAL_BASED_OUTPATIENT_CLINIC_OR_DEPARTMENT_OTHER)
Admission: RE | Admit: 2020-08-31 | Discharge: 2020-08-31 | Disposition: A | Discharge status: Home / Self Care | Payer: BC Managed Care – PPO | Source: Ambulatory Visit | Attending: Cardiology | Admitting: Cardiology

## 2020-08-31 ENCOUNTER — Other Ambulatory Visit: Payer: Self-pay

## 2020-08-31 ENCOUNTER — Encounter (HOSPITAL_COMMUNITY): Payer: Self-pay

## 2020-08-31 ENCOUNTER — Ambulatory Visit (HOSPITAL_COMMUNITY)
Admission: RE | Admit: 2020-08-31 | Discharge: 2020-08-31 | Disposition: A | Discharge status: Home / Self Care | Payer: BC Managed Care – PPO | Source: Ambulatory Visit | Attending: Cardiology | Admitting: Cardiology

## 2020-08-31 DIAGNOSIS — I10 Essential (primary) hypertension: Secondary | ICD-10-CM

## 2020-08-31 LAB — CYTOLOGY - PAP
Comment: NEGATIVE
Diagnosis: NEGATIVE
High risk HPV: NEGATIVE

## 2020-09-01 DIAGNOSIS — F4310 Post-traumatic stress disorder, unspecified: Secondary | ICD-10-CM | POA: Diagnosis not present

## 2020-09-07 ENCOUNTER — Ambulatory Visit (HOSPITAL_COMMUNITY)
Admission: RE | Admit: 2020-09-07 | Discharge: 2020-09-07 | Disposition: A | Discharge status: Home / Self Care | Payer: BC Managed Care – PPO | Source: Ambulatory Visit | Attending: Family Medicine | Admitting: Family Medicine

## 2020-09-07 ENCOUNTER — Other Ambulatory Visit: Payer: Self-pay

## 2020-09-07 DIAGNOSIS — Z1231 Encounter for screening mammogram for malignant neoplasm of breast: Secondary | ICD-10-CM | POA: Insufficient documentation

## 2020-09-08 ENCOUNTER — Other Ambulatory Visit: Payer: Self-pay | Admitting: Gastroenterology

## 2020-09-08 DIAGNOSIS — K219 Gastro-esophageal reflux disease without esophagitis: Secondary | ICD-10-CM

## 2020-09-08 DIAGNOSIS — R1310 Dysphagia, unspecified: Secondary | ICD-10-CM

## 2020-09-08 DIAGNOSIS — R110 Nausea: Secondary | ICD-10-CM

## 2020-09-12 DIAGNOSIS — D519 Vitamin B12 deficiency anemia, unspecified: Secondary | ICD-10-CM | POA: Diagnosis not present

## 2020-09-13 ENCOUNTER — Telehealth: Payer: Self-pay | Admitting: Cardiology

## 2020-09-13 ENCOUNTER — Other Ambulatory Visit (HOSPITAL_COMMUNITY): Payer: Self-pay | Admitting: Family Medicine

## 2020-09-13 ENCOUNTER — Inpatient Hospital Stay
Admission: RE | Admit: 2020-09-13 | Discharge: 2020-09-13 | Disposition: A | Discharge status: Home / Self Care | Payer: Self-pay | Source: Ambulatory Visit | Attending: Family Medicine | Admitting: Family Medicine

## 2020-09-13 DIAGNOSIS — Z1231 Encounter for screening mammogram for malignant neoplasm of breast: Secondary | ICD-10-CM

## 2020-09-13 NOTE — Telephone Encounter (Signed)
BP Reading  8/15 5p 183/106  hr 51 8/16 174/99 hr 62 8/17 183/103 hr 53 8/18/ 181/99  8/19 172/97 hr 59 8/21 167/95 hr 63 8/23 185/104 hr62 8/25 169/94 hr 8/27 157/91 hr 63  8/28 167/96 hr 63  8/29 153/86 hr 67 8/29 169/92 hr 65 8/30 170/95 hr 57 9/1 175/94 hr 71  9/2 175/99 hr 64 9/3 178/100 hr 72 9/4 168/100 hr 83 9/5 175 /99 hr 72 9/6 173/99 hr 63

## 2020-09-14 NOTE — Telephone Encounter (Signed)
BP's too high. Can we find out what happened to the renin/aldo labs she was to have. I need those back before starting an additional medication for her blood pressure called spironolactone   Dominga Ferry MD

## 2020-09-15 ENCOUNTER — Telehealth: Payer: Self-pay | Admitting: Cardiology

## 2020-09-15 NOTE — Telephone Encounter (Signed)
Pt verbalized that she will get lab work completed asap.

## 2020-09-15 NOTE — Telephone Encounter (Signed)
New message    Patient returning call to [facility] from yesterday

## 2020-09-15 NOTE — Telephone Encounter (Signed)
Pt states that she spoke with Morrie Sheldon and agrees with plan of care to have test done.

## 2020-09-20 ENCOUNTER — Encounter: Payer: Self-pay | Admitting: *Deleted

## 2020-09-21 ENCOUNTER — Telehealth: Payer: Self-pay

## 2020-09-21 DIAGNOSIS — E785 Hyperlipidemia, unspecified: Secondary | ICD-10-CM | POA: Diagnosis not present

## 2020-09-21 DIAGNOSIS — I10 Essential (primary) hypertension: Secondary | ICD-10-CM | POA: Diagnosis not present

## 2020-09-21 DIAGNOSIS — G4730 Sleep apnea, unspecified: Secondary | ICD-10-CM | POA: Diagnosis not present

## 2020-09-21 DIAGNOSIS — I701 Atherosclerosis of renal artery: Secondary | ICD-10-CM | POA: Diagnosis not present

## 2020-09-21 LAB — TSH: TSH: 1.45 mIU/L (ref 0.40–4.50)

## 2020-09-21 NOTE — Telephone Encounter (Signed)
Please call [facility] on Monday and give them an ICD code for TSH orders.

## 2020-09-28 NOTE — Telephone Encounter (Signed)
Call placed to [facility] - code give for other fatigue - R53.83.

## 2020-09-29 DIAGNOSIS — F4310 Post-traumatic stress disorder, unspecified: Secondary | ICD-10-CM | POA: Diagnosis not present

## 2020-09-30 LAB — BASIC METABOLIC PANEL
BUN: 13 mg/dL (ref 7–25)
CO2: 31 mmol/L (ref 20–32)
Calcium: 10.6 mg/dL — ABNORMAL HIGH (ref 8.6–10.4)
Chloride: 102 mmol/L (ref 98–110)
Creat: 0.98 mg/dL (ref 0.50–1.05)
Glucose, Bld: 94 mg/dL (ref 65–99)
Potassium: 4.6 mmol/L (ref 3.5–5.3)
Sodium: 140 mmol/L (ref 135–146)

## 2020-09-30 LAB — ALDOSTERONE + RENIN ACTIVITY W/ RATIO
ALDO / PRA Ratio: 3 Ratio (ref 0.9–28.9)
Aldosterone: 2 ng/dL
Renin Activity: 0.67 ng/mL/h (ref 0.25–5.82)

## 2020-09-30 LAB — MAGNESIUM: Magnesium: 1.9 mg/dL (ref 1.5–2.5)

## 2020-10-02 DIAGNOSIS — F4310 Post-traumatic stress disorder, unspecified: Secondary | ICD-10-CM | POA: Diagnosis not present

## 2020-10-11 ENCOUNTER — Encounter: Payer: Self-pay | Admitting: Cardiology

## 2020-10-11 ENCOUNTER — Ambulatory Visit: Payer: BC Managed Care – PPO | Admitting: Cardiology

## 2020-10-11 VITALS — BP 180/84 | HR 44 | Ht 66.0 in | Wt 176.8 lb

## 2020-10-11 DIAGNOSIS — I10 Essential (primary) hypertension: Secondary | ICD-10-CM

## 2020-10-11 DIAGNOSIS — Z79899 Other long term (current) drug therapy: Secondary | ICD-10-CM | POA: Diagnosis not present

## 2020-10-11 MED ORDER — CARVEDILOL 12.5 MG PO TABS: 12.5000 mg | ORAL_TABLET | Freq: Two times a day (BID) | ORAL | 2 refills | Status: DC

## 2020-10-11 MED ORDER — SPIRONOLACTONE 25 MG PO TABS: 25.0000 mg | ORAL_TABLET | Freq: Every day | ORAL | 1 refills | Status: DC

## 2020-10-11 NOTE — Progress Notes (Signed)
Clinical Summary Ms. Kuwahara is a 65 y.o.female seen today for follow up of the following medical problems.   Resistant HTN - recent issues with high bp's, SBPs up to 200s at times - 09/2020 normal renin/aldo levels - 08/2020 renal artery [place] no significant stenosis, only mild left sided disease - has known sleep apnea, has appt with pulm later this week      - last visit we weaned clonidine and then stopped, was having fatigue and bradycardia - we changed HCTZ to chlorthalidone  - no NSAIDs   2. OSA - does wear cpap, has not been evaluated in quite some time.     SH: grandaughter 68 months old lives in [place].     Past Medical History:  Diagnosis Date   Anxiety    GERD (gastroesophageal reflux disease)    HLD (hyperlipidemia)    HTN (hypertension)    Major depressive disorder    Migraine with aura    RAS (renal artery stenosis) (HCC)    Sleep apnea    Sleep related hypoxia    Spinal stenosis      Allergies  Allergen Reactions   Amlodipine Swelling     Current Outpatient Medications  Medication Sig Dispense Refill   buPROPion (WELLBUTRIN SR) 200 MG 12 hr tablet Take 200 mg by mouth 2 (two) times daily.     carvedilol (COREG) 25 MG tablet Take 25 mg by mouth 2 (two) times daily.     chlorthalidone (HYGROTON) 25 MG tablet Take 1 tablet (25 mg total) by mouth daily. 30 tablet 6   clonazePAM (KLONOPIN) 0.5 MG tablet Take 0.5 mg by mouth daily as needed.     Cyanocobalamin (VITAMIN B-12 IJ) Inject 50 Units as directed once a week.     cyclobenzaprine (FLEXERIL) 10 MG tablet Take 10 mg by mouth at bedtime as needed.     DULoxetine (CYMBALTA) 60 MG capsule Take 60 mg by mouth 2 (two) times daily.     gabapentin (NEURONTIN) 300 MG capsule Take 1-2 capsules by mouth 3 (three) times daily. 1 capsule in morning, 1 capsule at noon, 2 capsules at night     hydrALAZINE (APRESOLINE) 100 MG tablet Take 100 mg by mouth 2 (two) times daily.     lamoTRIgine (LAMICTAL) 25  MG tablet Take 50 mg by mouth every morning.     lansoprazole (PREVACID) 30 MG capsule TAKE 1 CAPSULE(30 MG) BY MOUTH TWICE DAILY BEFORE A MEAL 60 capsule 3   lisinopril (ZESTRIL) 40 MG tablet Take 40 mg by mouth daily.     melatonin 5 MG TABS Take 5 mg by mouth at bedtime as needed.     Omega-3 Fatty Acids (FISH OIL) 1000 MG CAPS Take 1 capsule by mouth daily.     No current facility-administered medications for this visit.     Past Surgical History:  Procedure Laterality Date   BUNIONECTOMY     CATARACT EXTRACTION     CHOLECYSTECTOMY       Allergies  Allergen Reactions   Amlodipine Swelling      Family History  Problem Relation Age of Onset   Stroke Father    Hypertension Father    Heart disease Father    Hypertension Mother    Diabetes Mother    Heart disease Mother    Cancer Brother    Non-Hodgkin's lymphoma Brother    Hypertension Sister    Hypertension Sister    Colon cancer Neg Hx  Social History Ms. Brinley reports that she has been smoking. She has never used smokeless tobacco. Ms. Leedy reports current alcohol use.   Review of Systems CONSTITUTIONAL: No weight loss, fever, chills, weakness or fatigue.  HEENT: Eyes: No visual loss, blurred vision, double vision or yellow sclerae.No hearing loss, sneezing, congestion, runny nose or sore throat.  SKIN: No rash or itching.  CARDIOVASCULAR: per hpi RESPIRATORY: No shortness of breath, cough or sputum.  GASTROINTESTINAL: No anorexia, nausea, vomiting or diarrhea. No abdominal pain or blood.  GENITOURINARY: No burning on urination, no polyuria NEUROLOGICAL: No headache, dizziness, syncope, paralysis, ataxia, numbness or tingling in the extremities. No change in bowel or bladder control.  MUSCULOSKELETAL: No muscle, back pain, joint pain or stiffness.  LYMPHATICS: No enlarged nodes. No history of splenectomy.  PSYCHIATRIC: No history of depression or anxiety.  ENDOCRINOLOGIC: No reports of sweating,  cold or heat intolerance. No polyuria or polydipsia.  Marland Kitchen   Physical Examination Today's Vitals   10/11/20 1020  BP: (!) 180/84  Pulse: (!) 44  SpO2: 98%  Weight: 176 lb 12.8 oz (80.2 kg)  Height: 5\' 6"  (1.676 m)   Body mass index is 28.54 kg/m.  Gen: resting comfortably, no acute distress HEENT: no scleral icterus, pupils equal round and reactive, no palptable cervical adenopathy,  CV: RRR, no m/r/g, no jvd Resp: Clear to auscultation bilaterally GI: abdomen is soft, non-tender, non-distended, normal bowel sounds, no hepatosplenomegaly MSK: extremities are warm, no edema.  Skin: warm, no rash Neuro:  no focal deficits Psych: appropriate affect   Diagnostic Studies     Assessment and Plan   Resistant HTN - ongoing issues with HTN,on multiple meds - weaned off clonidine due to fatigue and bradycardia  Ongoing bradycardia, lower coreg to 12.5mg  bid - start aldactone 25mg  daily, check bmet 2 weeks.  - negative workup for hyperaldo, renal artery stenosis. Known OSA with repeat eval later this week.  - she will update on home bp's in 2 weeks.  - could try higher doses of chlorthalidone pending labs. She has norvasc allergy.    2. Bradycardia - EKG today sinus brady 46. Already weaned and stopped clonidine, will lower coreg to 12.5mg  bid     , M.D.

## 2020-10-11 NOTE — Patient Instructions (Addendum)
Medication Instructions:  Your physician has recommended you make the following change in your medication:  Decrease carvedilol to 12.5 mg twice daily Start spironolactone 25 mg daily Continue other medications the same  Labwork: BMET in 2 weeks ([facility])  Testing/Procedures: none  Follow-Up: Your physician recommends that you schedule a follow-up appointment in: 3 months  Any Other Special Instructions Will Be Listed Below (If Applicable). Call in 2 weeks with your home blood pressure readings  If you need a refill on your cardiac medications before your next appointment, please call your pharmacy.

## 2020-10-13 ENCOUNTER — Encounter: Payer: Self-pay | Admitting: Pulmonary Disease

## 2020-10-13 ENCOUNTER — Ambulatory Visit: Payer: BC Managed Care – PPO | Admitting: Pulmonary Disease

## 2020-10-13 ENCOUNTER — Other Ambulatory Visit: Payer: Self-pay

## 2020-10-13 VITALS — BP 134/78 | HR 74 | Temp 98.2°F | Ht 66.0 in | Wt 176.0 lb

## 2020-10-13 DIAGNOSIS — I10 Essential (primary) hypertension: Secondary | ICD-10-CM | POA: Diagnosis not present

## 2020-10-13 DIAGNOSIS — G4700 Insomnia, unspecified: Secondary | ICD-10-CM

## 2020-10-13 DIAGNOSIS — G4733 Obstructive sleep apnea (adult) (pediatric): Secondary | ICD-10-CM

## 2020-10-13 DIAGNOSIS — Z23 Encounter for immunization: Secondary | ICD-10-CM | POA: Diagnosis not present

## 2020-10-13 NOTE — Patient Instructions (Signed)
Will have you sign a release form to get copy of your sleep studies done in [place] and doctor visit notes before each sleep study was done  Once we receive your medical records we can then get you set up with a medical supply company  Follow up in 4 months

## 2020-10-13 NOTE — Progress Notes (Signed)
[facility]  Chief Complaint  Patient presents with   Consult    Sleep consult. Sleep apnea has cpap machine. Machine has not been updated/serviced in a few years. Sleep study done in [place] (pt thinks it was 2015).     Past Surgical History:  She  has a past surgical history that includes Cholecystectomy; Bunionectomy; and Cataract extraction.  Past Medical History:  HTN, GERD, HLD, Anxiety, Depression, Migraine headaches, Renal artery stenosis, Spinal stenosis  Constitutional:  BP 134/78 (BP Location: Left Arm, Patient Position: Sitting)   Pulse 74   Temp 98.2 F (36.8 C) (Temporal)   Ht 5\' 6"  (1.676 m)   Wt 176 lb 0.6 oz (79.9 kg)   SpO2 98%   BMI 28.41 kg/m   Brief Summary:  Erica Gallegos is a 65 y.o. female smoker with sleep apnea.      Subjective:   She moved from [place] about a year ago.  She had a sleep study around 2015.  She was told she could choose to use CPAP, but this wasn't emphasized so she opted against this.  She had another sleep study in 2018, and was then started on CPAP.  She would breath through her mouth, so ended up getting a full face mask.  Never offered a chin strap.    She goes to sleep between 930 and 1030 pm.  She falls asleep after about 10 minutes.  She wakes up soem times to use the bathroom.she is a restless sleeper.  She dreams more when her sleep is good.  She gets out of bed at 8 am.  She feels tired in the morning, and this persists throughout the day.  She has trouble staying awake while watching TV.  She still feels tired if she naps..  She denies morning headache.  She 10 mg melatonin at night, and sometimes uses an OTC sleep aide.  She drinks coffee in the morning. She denies sleep walking, sleep talking, bruxism, or nightmares.  There is no history of restless legs.  She denies sleep hallucinations, sleep paralysis, or cataplexy.  The Epworth score is 18 out of 24.   Physical  Exam:   Appearance - well kempt   ENMT - no sinus tenderness, no oral exudate, no LAN, Mallampati 3 airway, no stridor, wears dentures  Respiratory - equal breath sounds bilaterally, no wheezing or rales  CV - s1s2 regular rate and rhythm, no murmurs  Ext - no clubbing, no edema  Skin - no rashes  Psych - normal mood and affect   Sleep Tests:    Social History:  She  reports that she has been smoking. She has never used smokeless tobacco. She reports current alcohol use. She reports current drug use. Drug: Marijuana.  Family History:  Her family history includes Cancer in her brother; Diabetes in her mother; Heart disease in her father and mother; Hypertension in her father, mother, sister, and sister; Non-Hodgkin's lymphoma in her brother; Stroke in her father.    Discussion:  She has persistent sleep disruption and daytime sleepiness.  She has history of obstructive sleep apnea and reports compliance with CPAP therapy.  She is followed by cardiology for hypertension.  Assessment/Plan:   Snoring with excessive daytime sleepiness with history of obstructive sleep apnea. - will get copy of her doctor visits and sleep studies from 2019 - will then arrange for her to get set up with a DME in [place], get a  copy of her CPAP download, and arrange for CPAP mask refitting - if we can't get records from [place], then she will need to repeat home sleep study here - if she still has trouble with CPAP set up and daytime sleepiness, then she will need a CPAP titration study  Insomnia. - she has been using melatonin at night  Resistant hypertension. - followed by Dr. Dina Rich with [facility]  Obesity. - discussed how weight can impact sleep and risk for sleep disordered breathing - discussed options to assist with weight loss: combination of diet modification, cardiovascular and strength training exercises  Cardiovascular risk. - had an extensive  discussion regarding the adverse health consequences related to untreated sleep disordered breathing - specifically discussed the risks for hypertension, coronary artery disease, cardiac dysrhythmias, cerebrovascular disease, and diabetes - lifestyle modification discussed  Safe driving practices. - discussed how sleep disruption can increase risk of accidents, particularly when driving - safe driving practices were discussed  Therapies for obstructive sleep apnea. - if the sleep study shows significant sleep apnea, then various therapies for treatment were reviewed: CPAP, oral appliance, and surgical interventions   Time Spent Involved in Patient Care on Day of Examination:  46 minutes  Follow up:   Patient Instructions  Will have you sign a release form to get copy of your sleep studies done in [place] and doctor visit notes before each sleep study was done  Once we receive your medical records we can then get you set up with a medical supply company  Follow up in 4 months  Medication List:   Allergies as of 10/13/2020       Reactions   Amlodipine Swelling        Medication List        Accurate as of October 13, 2020 10:00 AM. If you have any questions, ask your nurse or doctor.          STOP taking these medications    VITAMIN B-12 IJ Stopped by: Coralyn Helling, MD       TAKE these medications    buPROPion 200 MG 12 hr tablet Commonly known as: WELLBUTRIN SR Take 200 mg by mouth 2 (two) times daily.   carvedilol 12.5 MG tablet Commonly known as: COREG Take 1 tablet (12.5 mg total) by mouth 2 (two) times daily.   chlorthalidone 25 MG tablet Commonly known as: HYGROTON Take 1 tablet (25 mg total) by mouth daily.   clonazePAM 0.5 MG tablet Commonly known as: KLONOPIN Take 0.5 mg by mouth daily as needed.   cyclobenzaprine 10 MG tablet Commonly known as: FLEXERIL Take 10 mg by mouth at bedtime as needed.   DULoxetine 60 MG capsule Commonly  known as: CYMBALTA Take 60 mg by mouth 2 (two) times daily.   Fish Oil 1000 MG Caps Take 1 capsule by mouth daily.   gabapentin 300 MG capsule Commonly known as: NEURONTIN Take 1-2 capsules by mouth 3 (three) times daily. 1 capsule in morning, 1 capsule at noon, 2 capsules at night   hydrALAZINE 100 MG tablet Commonly known as: APRESOLINE Take 100 mg by mouth 2 (two) times daily.   lamoTRIgine 25 MG tablet Commonly known as: LAMICTAL Take 50 mg by mouth every morning.   lansoprazole 30 MG capsule Commonly known as: PREVACID TAKE 1 CAPSULE(30 MG) BY MOUTH TWICE DAILY BEFORE A MEAL   lisinopril 40 MG tablet Commonly known as: ZESTRIL Take 40 mg by mouth daily.   melatonin 5  MG Tabs Take 5 mg by mouth at bedtime as needed.   spironolactone 25 MG tablet Commonly known as: ALDACTONE Take 1 tablet (25 mg total) by mouth daily.        Signature:  Coralyn Helling, MD [facility] Pager - [phone] 10/13/2020, 10:00 AM

## 2020-10-18 ENCOUNTER — Encounter: Payer: Self-pay | Admitting: Pulmonary Disease

## 2020-10-18 DIAGNOSIS — Z139 Encounter for screening, unspecified: Secondary | ICD-10-CM | POA: Diagnosis not present

## 2020-10-18 DIAGNOSIS — Z131 Encounter for screening for diabetes mellitus: Secondary | ICD-10-CM | POA: Diagnosis not present

## 2020-10-18 DIAGNOSIS — E559 Vitamin D deficiency, unspecified: Secondary | ICD-10-CM | POA: Diagnosis not present

## 2020-10-18 DIAGNOSIS — Z0001 Encounter for general adult medical examination with abnormal findings: Secondary | ICD-10-CM | POA: Diagnosis not present

## 2020-10-27 DIAGNOSIS — I10 Essential (primary) hypertension: Secondary | ICD-10-CM | POA: Diagnosis not present

## 2020-10-27 DIAGNOSIS — Z79899 Other long term (current) drug therapy: Secondary | ICD-10-CM | POA: Diagnosis not present

## 2020-10-28 LAB — BASIC METABOLIC PANEL
BUN: 16 mg/dL (ref 7–25)
CO2: 28 mmol/L (ref 20–32)
Calcium: 9.8 mg/dL (ref 8.6–10.4)
Chloride: 106 mmol/L (ref 98–110)
Creat: 0.96 mg/dL (ref 0.50–1.05)
Glucose, Bld: 94 mg/dL (ref 65–99)
Potassium: 4.8 mmol/L (ref 3.5–5.3)
Sodium: 140 mmol/L (ref 135–146)

## 2020-10-30 DIAGNOSIS — F331 Major depressive disorder, recurrent, moderate: Secondary | ICD-10-CM | POA: Diagnosis not present

## 2020-11-06 ENCOUNTER — Telehealth: Payer: Self-pay | Admitting: Cardiology

## 2020-11-06 DIAGNOSIS — Z79899 Other long term (current) drug therapy: Secondary | ICD-10-CM

## 2020-11-06 DIAGNOSIS — I10 Essential (primary) hypertension: Secondary | ICD-10-CM

## 2020-11-06 NOTE — Telephone Encounter (Signed)
Pt c/o BP issue: STAT if pt c/o blurred vision, one-sided weakness or slurred speech  1. What are your last 5 BP readings?   10/17/20: 184/102 HR 60 10/18/20: 164/96   HR 71 10/21/20: 180/103 HR 67 10/22/20: 156/94   HR 69 10/23/20: 171/105 HR 67 10/24/20: 150/89   HR 62 10/25/20: 166/100 HR 63 10/26/20: 161/99   HR 63  10/27/20: 160/89   HR 63  10/28/20: 193/94   HR 63 11/02/20: 173/96   HR 65   2. Are you having any other symptoms (ex. Dizziness, headache, blurred vision, passed out)? Occasional dizziness. Not predictable.   3. What is your BP issue? Patient was told to take her BP and HR and send it back to Dr. Wyline Mood

## 2020-11-07 ENCOUNTER — Other Ambulatory Visit: Payer: Self-pay | Admitting: *Deleted

## 2020-11-07 DIAGNOSIS — I10 Essential (primary) hypertension: Secondary | ICD-10-CM

## 2020-11-07 DIAGNOSIS — Z79899 Other long term (current) drug therapy: Secondary | ICD-10-CM

## 2020-11-07 MED ORDER — CHLORTHALIDONE 25 MG PO TABS: 37.5000 mg | ORAL_TABLET | Freq: Every day | ORAL | 6 refills | Status: DC

## 2020-11-07 NOTE — Telephone Encounter (Signed)
Patient informed and verbalized understanding of plan. Lab order faxed to [facility]. 

## 2020-11-07 NOTE — Telephone Encounter (Signed)
BP's running too high, can she increase chlorthalidone ot 37.5mg  daily. Needs a bmet 2 weeks   Dominga Ferry MD

## 2020-11-13 ENCOUNTER — Telehealth: Payer: Self-pay | Admitting: Pulmonary Disease

## 2020-11-13 NOTE — Telephone Encounter (Signed)
Called and spoke to patient. HST results were received by [place]. Let her know I will fax over the HST results to the [place] office for Dr. Craige Cotta to review since patient is waiting for him to review so we can place an order for new cpap equipment.

## 2020-11-13 NOTE — Telephone Encounter (Signed)
HST faxed over to [facility] office for Dr. Craige Cotta to look through and notified Tenna Child of forms being faxed over.

## 2020-11-14 ENCOUNTER — Other Ambulatory Visit: Payer: Self-pay

## 2020-11-14 ENCOUNTER — Telehealth: Payer: Self-pay | Admitting: Pulmonary Disease

## 2020-11-14 DIAGNOSIS — F4310 Post-traumatic stress disorder, unspecified: Secondary | ICD-10-CM | POA: Diagnosis not present

## 2020-11-14 DIAGNOSIS — H524 Presbyopia: Secondary | ICD-10-CM | POA: Diagnosis not present

## 2020-11-14 DIAGNOSIS — G4733 Obstructive sleep apnea (adult) (pediatric): Secondary | ICD-10-CM

## 2020-11-14 NOTE — Telephone Encounter (Signed)
HST 05/29/17 >> AHI 22, SpO2 low 78%   Please let her know that her sleep study was reviewed and shows moderate obstructive sleep apnea.  Please send order to arrange for DME in [place] for new CPAP supplies and refitting of her CPAP mask.

## 2020-11-14 NOTE — Telephone Encounter (Signed)
Spoke to patient. She voiced understanding. Order has been place.

## 2020-11-21 DIAGNOSIS — F4310 Post-traumatic stress disorder, unspecified: Secondary | ICD-10-CM | POA: Diagnosis not present

## 2020-11-23 DIAGNOSIS — G4733 Obstructive sleep apnea (adult) (pediatric): Secondary | ICD-10-CM | POA: Diagnosis not present

## 2020-12-14 ENCOUNTER — Other Ambulatory Visit: Payer: Self-pay

## 2020-12-15 DIAGNOSIS — I10 Essential (primary) hypertension: Secondary | ICD-10-CM | POA: Diagnosis not present

## 2020-12-15 DIAGNOSIS — Z79899 Other long term (current) drug therapy: Secondary | ICD-10-CM | POA: Diagnosis not present

## 2020-12-16 LAB — BASIC METABOLIC PANEL
BUN/Creatinine Ratio: 22 (calc) (ref 6–22)
BUN: 25 mg/dL (ref 7–25)
CO2: 28 mmol/L (ref 20–32)
Calcium: 10.3 mg/dL (ref 8.6–10.4)
Chloride: 105 mmol/L (ref 98–110)
Creat: 1.12 mg/dL — ABNORMAL HIGH (ref 0.50–1.05)
Glucose, Bld: 88 mg/dL (ref 65–99)
Potassium: 4.9 mmol/L (ref 3.5–5.3)
Sodium: 138 mmol/L (ref 135–146)

## 2020-12-19 DIAGNOSIS — F4310 Post-traumatic stress disorder, unspecified: Secondary | ICD-10-CM | POA: Diagnosis not present

## 2020-12-20 ENCOUNTER — Encounter: Payer: Self-pay | Admitting: *Deleted

## 2020-12-20 ENCOUNTER — Telehealth: Payer: Self-pay | Admitting: *Deleted

## 2020-12-20 NOTE — Telephone Encounter (Signed)
-----   Message from Antoine Poche, MD sent at 12/19/2020  3:24 PM EST ----- Labs look fine  Dominga Ferry MD

## 2020-12-20 NOTE — Telephone Encounter (Signed)
Lesle Chris, LPN  20/80/2233  5:27 PM EST Back to Top    Notified via my chart.  Copy to pcp.

## 2021-01-03 DIAGNOSIS — F4310 Post-traumatic stress disorder, unspecified: Secondary | ICD-10-CM | POA: Diagnosis not present

## 2021-01-11 ENCOUNTER — Encounter: Payer: Self-pay | Admitting: Cardiology

## 2021-01-11 DIAGNOSIS — I10 Essential (primary) hypertension: Secondary | ICD-10-CM | POA: Diagnosis not present

## 2021-01-11 DIAGNOSIS — Z131 Encounter for screening for diabetes mellitus: Secondary | ICD-10-CM | POA: Diagnosis not present

## 2021-01-11 DIAGNOSIS — E559 Vitamin D deficiency, unspecified: Secondary | ICD-10-CM | POA: Diagnosis not present

## 2021-01-11 DIAGNOSIS — E7849 Other hyperlipidemia: Secondary | ICD-10-CM | POA: Diagnosis not present

## 2021-01-15 ENCOUNTER — Encounter: Payer: Self-pay | Admitting: *Deleted

## 2021-01-15 ENCOUNTER — Ambulatory Visit: Payer: BC Managed Care – PPO | Admitting: Cardiology

## 2021-01-15 ENCOUNTER — Encounter: Payer: Self-pay | Admitting: Cardiology

## 2021-01-15 VITALS — BP 130/70 | HR 58 | Ht 66.0 in | Wt 176.8 lb

## 2021-01-15 DIAGNOSIS — I10 Essential (primary) hypertension: Secondary | ICD-10-CM

## 2021-01-15 NOTE — Patient Instructions (Addendum)
Medication Instructions:  Continue all current medications.   Labwork: none  Testing/Procedures: none  Follow-Up: 6 months   Any Other Special Instructions Will Be Listed Below (If Applicable).   If you need a refill on your cardiac medications before your next appointment, please call your pharmacy.  

## 2021-01-15 NOTE — Progress Notes (Signed)
Clinical Summary Erica Gallegos is a 66 y.o.female seen today for follow up of the following medical problems.    Resistant HTN - recent issues with high bp's, SBPs up to 200s at times - 09/2020 normal renin/aldo levels - 08/2020 renal artery [place] no significant stenosis, only mild left sided disease - has known sleep apnea, has appt with pulm later this week       - we weaned clonidine and then stopped, was having fatigue and bradycardia - we changed HCTZ to chlorthalidone  - home bp's 140s-160/80s     2. OSA - does wear cpap, has not been evaluated in quite some time.   - followed by Erica Gallegos     SH: grandaughter 61 months old lives in [place].      Past Medical History:  Diagnosis Date   Anxiety    GERD (gastroesophageal reflux disease)    HLD (hyperlipidemia)    HTN (hypertension)    Major depressive disorder    Migraine with aura    RAS (renal artery stenosis) (HCC)    Sleep apnea    Sleep related hypoxia    Spinal stenosis      Allergies  Allergen Reactions   Amlodipine Swelling     Current Outpatient Medications  Medication Sig Dispense Refill   buPROPion (WELLBUTRIN SR) 200 MG 12 hr tablet Take 200 mg by mouth 2 (two) times daily.     carvedilol (COREG) 12.5 MG tablet Take 1 tablet (12.5 mg total) by mouth 2 (two) times daily. 180 tablet 2   chlorthalidone (HYGROTON) 25 MG tablet Take 1.5 tablets (37.5 mg total) by mouth daily. 45 tablet 6   clonazePAM (KLONOPIN) 0.5 MG tablet Take 0.5 mg by mouth daily as needed.     cyclobenzaprine (FLEXERIL) 10 MG tablet Take 10 mg by mouth at bedtime as needed.     DULoxetine (CYMBALTA) 60 MG capsule Take 60 mg by mouth 2 (two) times daily.     gabapentin (NEURONTIN) 300 MG capsule Take 1-2 capsules by mouth 3 (three) times daily. 1 capsule in morning, 1 capsule at noon, 2 capsules at night     hydrALAZINE (APRESOLINE) 100 MG tablet Take 100 mg by mouth 2 (two) times daily.     lamoTRIgine (LAMICTAL) 25 MG  tablet Take 50 mg by mouth every morning.     lansoprazole (PREVACID) 30 MG capsule TAKE 1 CAPSULE(30 MG) BY MOUTH TWICE DAILY BEFORE A MEAL 60 capsule 3   lisinopril (ZESTRIL) 40 MG tablet Take 40 mg by mouth daily.     melatonin 5 MG TABS Take 5 mg by mouth at bedtime as needed.     Omega-3 Fatty Acids (FISH OIL) 1000 MG CAPS Take 1 capsule by mouth daily.     spironolactone (ALDACTONE) 25 MG tablet Take 1 tablet (25 mg total) by mouth daily. 90 tablet 1   No current facility-administered medications for this visit.     Past Surgical History:  Procedure Laterality Date   BUNIONECTOMY     CATARACT EXTRACTION     CHOLECYSTECTOMY       Allergies  Allergen Reactions   Amlodipine Swelling      Family History  Problem Relation Age of Onset   Stroke Father    Hypertension Father    Heart disease Father    Hypertension Mother    Diabetes Mother    Heart disease Mother    Cancer Brother    Non-Hodgkin's lymphoma Brother  Hypertension Sister    Hypertension Sister    Colon cancer Neg Hx      Social History Erica Gallegos reports that she has been smoking. She has never used smokeless tobacco. Erica Gallegos reports current alcohol use.   Review of Systems CONSTITUTIONAL: No weight loss, fever, chills, weakness or fatigue.  HEENT: Eyes: No visual loss, blurred vision, double vision or yellow sclerae.No hearing loss, sneezing, congestion, runny nose or sore throat.  SKIN: No rash or itching.  CARDIOVASCULAR: per hpi RESPIRATORY: No shortness of breath, cough or sputum.  GASTROINTESTINAL: No anorexia, nausea, vomiting or diarrhea. No abdominal pain or blood.  GENITOURINARY: No burning on urination, no polyuria NEUROLOGICAL: No headache, dizziness, syncope, paralysis, ataxia, numbness or tingling in the extremities. No change in bowel or bladder control.  MUSCULOSKELETAL: No muscle, back pain, joint pain or stiffness.  LYMPHATICS: No enlarged nodes. No history of splenectomy.   PSYCHIATRIC: No history of depression or anxiety.  ENDOCRINOLOGIC: No reports of sweating, cold or heat intolerance. No polyuria or polydipsia.  Marland Kitchen   Physical Examination Today's Vitals   01/15/21 1112  BP: 140/78  Pulse: (!) 58  SpO2: 97%  Weight: 176 lb 12.8 oz (80.2 kg)  Height: 5\' 6"  (1.676 m)   Body mass index is 28.54 kg/m.  Body mass index is 28.54 kg/m.  Gen: resting comfortably, no acute distress HEENT: no scleral icterus, pupils equal round and reactive, no palptable cervical adenopathy,  CV: RRR, no m/r/g no jvd Resp: Clear to auscultation bilaterally GI: abdomen is soft, non-tender, non-distended, normal bowel sounds, no hepatosplenomegaly MSK: extremities are warm, no edema.  Skin: warm, no rash Neuro:  no focal deficits Psych: appropriate affect    Assessment and Plan   Resistant HTN - ongoing issues with HTN,on multiple meds - weaned off clonidine due to fatigue and bradycardia - negative workup for hyperaldo, renal artery stenosis. Known OSA -  She has norvasc allergy.   - continue current meds        , M.D.

## 2021-01-19 DIAGNOSIS — F4310 Post-traumatic stress disorder, unspecified: Secondary | ICD-10-CM | POA: Diagnosis not present

## 2021-01-29 DIAGNOSIS — D229 Melanocytic nevi, unspecified: Secondary | ICD-10-CM | POA: Diagnosis not present

## 2021-01-29 DIAGNOSIS — E785 Hyperlipidemia, unspecified: Secondary | ICD-10-CM | POA: Diagnosis not present

## 2021-01-29 DIAGNOSIS — E559 Vitamin D deficiency, unspecified: Secondary | ICD-10-CM | POA: Diagnosis not present

## 2021-01-29 DIAGNOSIS — R945 Abnormal results of liver function studies: Secondary | ICD-10-CM | POA: Diagnosis not present

## 2021-01-31 DIAGNOSIS — F4310 Post-traumatic stress disorder, unspecified: Secondary | ICD-10-CM | POA: Diagnosis not present

## 2021-02-05 ENCOUNTER — Other Ambulatory Visit (HOSPITAL_COMMUNITY): Payer: Self-pay | Admitting: Nurse Practitioner

## 2021-02-05 ENCOUNTER — Other Ambulatory Visit: Payer: Self-pay | Admitting: Nurse Practitioner

## 2021-02-05 DIAGNOSIS — R945 Abnormal results of liver function studies: Secondary | ICD-10-CM

## 2021-02-09 ENCOUNTER — Telehealth: Payer: Self-pay | Admitting: Cardiology

## 2021-02-09 ENCOUNTER — Other Ambulatory Visit: Payer: Self-pay | Admitting: Cardiology

## 2021-02-09 NOTE — Telephone Encounter (Signed)
Pt notified that medication refill was sent in this afternoon to pharmacy. Pt will contact pharmacy.

## 2021-02-09 NOTE — Telephone Encounter (Signed)
° °  Pt c/o medication issue:  1. Name of Medication: chlorthalidone (HYGROTON) 25 MG tablet  2. How are you currently taking this medication (dosage and times per day)? TAKE 1 AND 1/2 TABLETS(37.5 MG) BY MOUTH DAILY  3. Are you having a reaction (difficulty breathing--STAT)?   4. What is your medication issue? Pt called, she said, her pharmacy told her this meds was discontinued and they think it was her pcp discontinued this meds. She is requesting if Dr. Wyline Mood send a new prescription do pharmacy can fill it

## 2021-02-14 ENCOUNTER — Ambulatory Visit (HOSPITAL_COMMUNITY)
Admission: RE | Admit: 2021-02-14 | Discharge: 2021-02-14 | Disposition: A | Discharge status: Home / Self Care | Payer: BC Managed Care – PPO | Source: Ambulatory Visit | Attending: Nurse Practitioner | Admitting: Nurse Practitioner

## 2021-02-14 ENCOUNTER — Other Ambulatory Visit: Payer: Self-pay

## 2021-02-14 DIAGNOSIS — M7130 Other bursal cyst, unspecified site: Secondary | ICD-10-CM | POA: Diagnosis not present

## 2021-02-14 DIAGNOSIS — R945 Abnormal results of liver function studies: Secondary | ICD-10-CM | POA: Diagnosis not present

## 2021-02-14 DIAGNOSIS — L820 Inflamed seborrheic keratosis: Secondary | ICD-10-CM | POA: Diagnosis not present

## 2021-02-19 DIAGNOSIS — F4310 Post-traumatic stress disorder, unspecified: Secondary | ICD-10-CM | POA: Diagnosis not present

## 2021-03-06 DIAGNOSIS — R748 Abnormal levels of other serum enzymes: Secondary | ICD-10-CM | POA: Diagnosis not present

## 2021-03-12 DIAGNOSIS — I10 Essential (primary) hypertension: Secondary | ICD-10-CM | POA: Diagnosis not present

## 2021-03-12 DIAGNOSIS — K760 Fatty (change of) liver, not elsewhere classified: Secondary | ICD-10-CM | POA: Diagnosis not present

## 2021-03-12 DIAGNOSIS — E785 Hyperlipidemia, unspecified: Secondary | ICD-10-CM | POA: Diagnosis not present

## 2021-03-12 DIAGNOSIS — R945 Abnormal results of liver function studies: Secondary | ICD-10-CM | POA: Diagnosis not present

## 2021-03-16 ENCOUNTER — Encounter: Payer: Self-pay | Admitting: *Deleted

## 2021-03-16 ENCOUNTER — Other Ambulatory Visit: Payer: Self-pay

## 2021-03-16 ENCOUNTER — Ambulatory Visit: Payer: BC Managed Care – PPO | Admitting: Internal Medicine

## 2021-03-16 ENCOUNTER — Encounter: Payer: Self-pay | Admitting: Internal Medicine

## 2021-03-16 VITALS — BP 126/72 | HR 72 | Temp 98.4°F | Ht 66.0 in | Wt 175.4 lb

## 2021-03-16 DIAGNOSIS — Z1159 Encounter for screening for other viral diseases: Secondary | ICD-10-CM | POA: Diagnosis not present

## 2021-03-16 NOTE — Patient Instructions (Signed)
It was nice to meet you today! ? ?As discussed, we will schedule an EGD (upper endoscopy) with possible esophageal dilation in the near future.  ASA 3/propofol ? ?No change in medication regimen at this time. ? ?Strive for regular aerobic exercise 3 times weekly. ? ?Weight loss goal of 5% over the next 12 months ? ?Regular coffee use-1 to 2 cups daily is helpful to your liver ? ?Hepatic function profile in 6 months ? ?Hepatitis C antibody screen now ? ?Repeat colonoscopy in 5 years for screening purposes ? ?Further recommendations to follow. ?

## 2021-03-16 NOTE — H&P (View-Only) (Signed)
? ? ?Primary Care Physician:  Ponciano Ort, [facility] ?Primary Gastroenterologist:  Dr.  Marland Kitchen ?Pre-Procedure History & Physical: ?HPI:  Erica Gallegos is a 66 y.o. female here for further evaluation of esophageal dysphagia.  Seen previously in our office for dysphagia.  Plans for EGD interrupted by significantly elevated blood pressure.  Blood pressure has since come under good control.  Several year history of intermittent esophageal dysphagia per patient report.  She describes transient food impactions.  Denies any GERD symptoms ever.  Currently on lansoprazole 30 mg twice daily empirically. ?Chronically constipated.  No melena or blood per rectum.  Negative colonoscopy in [place] 5 years ago.  No family history of GI malignancy. ?Fatty liver on recent ultrasound.  Gallbladder out.  No focal lesions.  No biliary dilation.  No sonographic evidence of chronic liver disease.  Had a mild bump in transaminases recently reported (I do not have abnormal labs review);  ?However, LFTs well within normal limits on 03/06/2021 (ALT 11, AST 12, ALP 87, total bilirubin 0.5). ? ?Patient has never been told she had hepatitis previously.  Denies yellow jaundice. ? ?For dysphagia in the past, she tells me she has undergone EGD in [place] previously.  Denies ever having an esophageal dilation.  Was told that her "esophageal flap" would not open". ? ? ? ?Past Medical History:  ?Diagnosis Date  ? Anxiety   ? GERD (gastroesophageal reflux disease)   ? HLD (hyperlipidemia)   ? HTN (hypertension)   ? Major depressive disorder   ? Migraine with aura   ? RAS (renal artery stenosis) (HCC)   ? Sleep apnea   ? Sleep related hypoxia   ? Spinal stenosis   ? ? ?Past Surgical History:  ?Procedure Laterality Date  ? BUNIONECTOMY    ? CATARACT EXTRACTION    ? CHOLECYSTECTOMY    ? ? ?Prior to Admission medications   ?Medication Sig Start Date End Date Taking? Authorizing Provider  ?buPROPion (WELLBUTRIN SR) 200 MG 12 hr tablet Take 200  mg by mouth 2 (two) times daily. 12/24/19  Yes [provider]  ?carvedilol (COREG) 12.5 MG tablet Take 1 tablet (12.5 mg total) by mouth 2 (two) times daily. 10/11/20  Yes BranchDorothe Pea, MD  ?chlorthalidone (HYGROTON) 25 MG tablet TAKE 1 AND 1/2 TABLETS(37.5 MG) BY MOUTH DAILY 02/09/21  Yes Branch, Dorothe Pea, MD  ?clonazePAM (KLONOPIN) 0.5 MG tablet Take 0.5 mg by mouth daily as needed. 02/29/20  Yes [provider]  ?cyclobenzaprine (FLEXERIL) 10 MG tablet Take 10 mg by mouth at bedtime as needed. 02/08/20  Yes [provider]  ?DULoxetine (CYMBALTA) 60 MG capsule Take 60 mg by mouth 2 (two) times daily.   Yes [provider]  ?gabapentin (NEURONTIN) 300 MG capsule Take 1-2 capsules by mouth 3 (three) times daily. 1 capsule in morning, 1 capsule at noon, 2 capsules at night 02/23/20  Yes [provider]  ?hydrALAZINE (APRESOLINE) 100 MG tablet Take 100 mg by mouth 2 (two) times daily. 05/23/20  Yes [provider]  ?lamoTRIgine (LAMICTAL) 25 MG tablet Take 50 mg by mouth every morning. 02/08/20  Yes [provider]  ?lansoprazole (PREVACID) 30 MG capsule TAKE 1 CAPSULE(30 MG) BY MOUTH TWICE DAILY BEFORE A MEAL 09/12/20  Yes Gelene Mink, NP  ?lisinopril (ZESTRIL) 40 MG tablet Take 40 mg by mouth daily. 02/23/20  Yes [provider]  ?melatonin 5 MG TABS Take 5 mg by mouth at bedtime as needed.  Yes [provider]  ?Omega-3 Fatty Acids (FISH OIL) 1000 MG CAPS Take 1 capsule by mouth daily. 11/24/19  Yes [provider]  ?spironolactone (ALDACTONE) 25 MG tablet Take 1 tablet (25 mg total) by mouth daily. 10/11/20  Yes BranchDorothe Pea, MD  ? ? ?Allergies as of 03/16/2021 - Review Complete 03/16/2021  ?Allergen Reaction Noted  ? Amlodipine Swelling 08/28/2020  ? ? ?Family History  ?Problem Relation Age of Onset  ? Stroke Father   ? Hypertension Father   ? Heart disease Father   ? Hypertension Mother   ? Diabetes Mother   ? Heart  disease Mother   ? Cancer Brother   ? Non-Hodgkin's lymphoma Brother   ? Hypertension Sister   ? Hypertension Sister   ? Colon cancer Neg Hx   ? ? ?Social History  ? ?Socioeconomic History  ? Marital status: Married  ?  Spouse name: Not on file  ? Number of children: Not on file  ? Years of education: Not on file  ? Highest education level: Not on file  ?Occupational History  ? Not on file  ?Tobacco Use  ? Smoking status: Former  ?  Types: Cigarettes  ? Smokeless tobacco: Never  ? Tobacco comments:  ?  vapes occ  ?Vaping Use  ? Vaping Use: Some days  ?Substance and Sexual Activity  ? Alcohol use: Yes  ?  Comment: seldom  ? Drug use: Yes  ?  Types: Marijuana  ?  Comment: CBD cream; Marijuana a couple times a week.   ? Sexual activity: Yes  ?  Birth control/protection: None, Post-menopausal  ?Other Topics Concern  ? Not on file  ?Social History Narrative  ? Not on file  ? ?Social Determinants of Health  ? ?Financial Resource Strain: Unknown  ? Difficulty of Paying Living Expenses: Patient refused  ?Food Insecurity: Unknown  ? Worried About Programme researcher, broadcasting/film/video in the Last Year: Patient refused  ? Ran Out of Food in the Last Year: Patient refused  ?Transportation Needs: Unknown  ? Lack of Transportation (Medical): Patient refused  ? Lack of Transportation (Non-Medical): Patient refused  ?Physical Activity: Unknown  ? Days of Exercise per Week: Patient refused  ? Minutes of Exercise per Session: Patient refused  ?Stress: Unknown  ? Feeling of Stress : Patient refused  ?Social Connections: Unknown  ? Frequency of Communication with Friends and Family: Patient refused  ? Frequency of Social Gatherings with Friends and Family: Patient refused  ? Attends Religious Services: Patient refused  ? Active Member of Clubs or Organizations: Patient refused  ? Attends Banker Meetings: Patient refused  ? Marital Status: Patient refused  ?Intimate Partner Violence: Unknown  ? Fear of Current or Ex-Partner: Patient refused   ? Emotionally Abused: Patient refused  ? Physically Abused: Patient refused  ? Sexually Abused: Patient refused  ? ? ?Review of Systems: ?See HPI, otherwise negative ROS ? ?Physical Exam: ?BP 126/72   Pulse 72   Temp 98.4 ?F (36.9 ?C) (Temporal)   Ht 5\' 6"  (1.676 m)   Wt 175 lb 6.4 oz (79.6 kg)   BMI 28.31 kg/m?  ?General:   Alert,  Well-developed, well-nourished, pleasant and cooperative in NAD ?Eyes:  Sclera clear, no icterus.   Conjunctiva pink. ?Neck:  Supple; no masses or thyromegaly. No significant cervical adenopathy. ?Lungs:  Clear throughout to auscultation.   No wheezes, crackles, or rhonchi. No acute distress. ?Heart:  Regular rate and rhythm; no murmurs,  clicks, rubs,  or gallops. ?Abdomen: Non-distended, normal bowel sounds.  Soft and nontender without appreciable mass or hepatosplenomegaly.  ?Pulses:  Normal pulses noted. ?Extremities:  Without clubbing or edema. ? ?Impression/Plan: Pleasant 66 year old lady with longstanding esophageal dysphagia.  Denies typical GERD.  Has been on high-dose acid suppression therapy empirically. ? ?Reports prior GI evaluation with findings as outlined above.  Denies esophageal dilation.  Ongoing symptoms are bothersome.  Previously recommended EGD for further evaluation.  Blood pressure is now well controlled.  Recommendations continue to be for an EGD with potential intervention as feasible/appropriate and lieu of barium study (patient not interested in). ? ?Also, mild bump in LFTs reported recently with normal labs last month.  Fatty liver on ultrasound without other concerning stigmata. ?Discussed fatty liver with patient at length.  Good to see her LFTs well within normal limits on last assay. ?Really no risk factors for viral hepatitis.  May well just have benign fatty liver disease (steatosis).  She needs a one-time screen for hepatitis C. ? ? ?Recommendations: ? ?As discussed, we will schedule an EGD (upper endoscopy) with possible esophageal dilation in  the near future.  ASA 3/propofol.  The risks, benefits, limitations, alternatives and imponderables have been reviewed with the patient. Potential for esophageal dilation, biopsy, etc. have also been review

## 2021-03-16 NOTE — Progress Notes (Signed)
Primary Care Physician:  Alanson Puls, [facility] Primary Gastroenterologist:  Dr.   Pre-Procedure History & Physical: HPI:  Erica Gallegos is a 66 y.o. female here for further evaluation of esophageal dysphagia.  Seen previously in our office for dysphagia.  Plans for EGD interrupted by significantly elevated blood pressure.  Blood pressure has since come under good control.  Several year history of intermittent esophageal dysphagia per patient report.  She describes transient food impactions.  Denies any GERD symptoms ever.  Currently on lansoprazole 30 mg twice daily empirically. Chronically constipated.  No melena or blood per rectum.  Negative colonoscopy in [place] 5 years ago.  No family history of GI malignancy. Fatty liver on recent ultrasound.  Gallbladder out.  No focal lesions.  No biliary dilation.  No sonographic evidence of chronic liver disease.  Had a mild bump in transaminases recently reported (I do not have abnormal labs review);  However, LFTs well within normal limits on 03/06/2021 (ALT 11, AST 12, ALP 87, total bilirubin 0.5).  Patient has never been told she had hepatitis previously.  Denies yellow jaundice.  For dysphagia in the past, she tells me she has undergone EGD in [place] previously.  Denies ever having an esophageal dilation.  Was told that her "esophageal flap" would not open".    Past Medical History:  Diagnosis Date   Anxiety    GERD (gastroesophageal reflux disease)    HLD (hyperlipidemia)    HTN (hypertension)    Major depressive disorder    Migraine with aura    RAS (renal artery stenosis) (HCC)    Sleep apnea    Sleep related hypoxia    Spinal stenosis     Past Surgical History:  Procedure Laterality Date   BUNIONECTOMY     CATARACT EXTRACTION     CHOLECYSTECTOMY      Prior to Admission medications   Medication Sig Start Date End Date Taking? Authorizing Provider  buPROPion (WELLBUTRIN SR) 200 MG 12 hr tablet Take 200  mg by mouth 2 (two) times daily. 12/24/19  Yes [provider]  carvedilol (COREG) 12.5 MG tablet Take 1 tablet (12.5 mg total) by mouth 2 (two) times daily. 10/11/20  Yes BranchAlphonse Guild, MD  chlorthalidone (HYGROTON) 25 MG tablet TAKE 1 AND 1/2 TABLETS(37.5 MG) BY MOUTH DAILY 02/09/21  Yes Branch, Alphonse Guild, MD  clonazePAM (KLONOPIN) 0.5 MG tablet Take 0.5 mg by mouth daily as needed. 02/29/20  Yes [provider]  cyclobenzaprine (FLEXERIL) 10 MG tablet Take 10 mg by mouth at bedtime as needed. 02/08/20  Yes [provider]  DULoxetine (CYMBALTA) 60 MG capsule Take 60 mg by mouth 2 (two) times daily.   Yes [provider]  gabapentin (NEURONTIN) 300 MG capsule Take 1-2 capsules by mouth 3 (three) times daily. 1 capsule in morning, 1 capsule at noon, 2 capsules at night 02/23/20  Yes [provider]  hydrALAZINE (APRESOLINE) 100 MG tablet Take 100 mg by mouth 2 (two) times daily. 05/23/20  Yes [provider]  lamoTRIgine (LAMICTAL) 25 MG tablet Take 50 mg by mouth every morning. 02/08/20  Yes [provider]  lansoprazole (PREVACID) 30 MG capsule TAKE 1 CAPSULE(30 MG) BY MOUTH TWICE DAILY BEFORE A MEAL 09/12/20  Yes Annitta Needs, NP  lisinopril (ZESTRIL) 40 MG tablet Take 40 mg by mouth daily. 02/23/20  Yes [provider]  melatonin 5 MG TABS Take 5 mg by mouth at bedtime as needed.  Yes [provider]  Omega-3 Fatty Acids (FISH OIL) 1000 MG CAPS Take 1 capsule by mouth daily. 11/24/19  Yes [provider]  spironolactone (ALDACTONE) 25 MG tablet Take 1 tablet (25 mg total) by mouth daily. 10/11/20  Yes Arnoldo Lenis, MD    Allergies as of 03/16/2021 - Review Complete 03/16/2021  Allergen Reaction Noted   Amlodipine Swelling 08/28/2020    Family History  Problem Relation Age of Onset   Stroke Father    Hypertension Father    Heart disease Father    Hypertension Mother    Diabetes Mother    Heart  disease Mother    Cancer Brother    Non-Hodgkin's lymphoma Brother    Hypertension Sister    Hypertension Sister    Colon cancer Neg Hx     Social History   Socioeconomic History   Marital status: Married    Spouse name: Not on file   Number of children: Not on file   Years of education: Not on file   Highest education level: Not on file  Occupational History   Not on file  Tobacco Use   Smoking status: Former    Types: Cigarettes   Smokeless tobacco: Never   Tobacco comments:    vapes occ  Vaping Use   Vaping Use: Some days  Substance and Sexual Activity   Alcohol use: Yes    Comment: seldom   Drug use: Yes    Types: Marijuana    Comment: CBD cream; Marijuana a couple times a week.    Sexual activity: Yes    Birth control/protection: None, Post-menopausal  Other Topics Concern   Not on file  Social History Narrative   Not on file   Social Determinants of Health   Financial Resource Strain: Unknown   Difficulty of Paying Living Expenses: Patient refused  Food Insecurity: Unknown   Worried About Charity fundraiser in the Last Year: Patient refused   Arboriculturist in the Last Year: Patient refused  Transportation Needs: Unknown   Film/video editor (Medical): Patient refused   Lack of Transportation (Non-Medical): Patient refused  Physical Activity: Unknown   Days of Exercise per Week: Patient refused   Minutes of Exercise per Session: Patient refused  Stress: Unknown   Feeling of Stress : Patient refused  Social Connections: Unknown   Frequency of Communication with Friends and Family: Patient refused   Frequency of Social Gatherings with Friends and Family: Patient refused   Attends Religious Services: Patient refused   Marine scientist or Organizations: Patient refused   Attends Music therapist: Patient refused   Marital Status: Patient refused  Intimate Production manager Violence: Unknown   Fear of Current or Ex-Partner: Patient refused    Emotionally Abused: Patient refused   Physically Abused: Patient refused   Sexually Abused: Patient refused    Review of Systems: See HPI, otherwise negative ROS  Physical Exam: BP 126/72    Pulse 72    Temp 98.4 F (36.9 C) (Temporal)    Ht 5\' 6"  (1.676 m)    Wt 175 lb 6.4 oz (79.6 kg)    BMI 28.31 kg/m  General:   Alert,  Well-developed, well-nourished, pleasant and cooperative in NAD Eyes:  Sclera clear, no icterus.   Conjunctiva pink. Neck:  Supple; no masses or thyromegaly. No significant cervical adenopathy. Lungs:  Clear throughout to auscultation.   No wheezes, crackles, or rhonchi. No acute distress. Heart:  Regular  rate and rhythm; no murmurs, clicks, rubs,  or gallops. Abdomen: Non-distended, normal bowel sounds.  Soft and nontender without appreciable mass or hepatosplenomegaly.  Pulses:  Normal pulses noted. Extremities:  Without clubbing or edema.  Impression/Plan: Pleasant 66 year old lady with longstanding esophageal dysphagia.  Denies typical GERD.  Has been on high-dose acid suppression therapy empirically.  Reports prior GI evaluation with findings as outlined above.  Denies esophageal dilation.  Ongoing symptoms are bothersome.  Previously recommended EGD for further evaluation.  Blood pressure is now well controlled.  Recommendations continue to be for an EGD with potential intervention as feasible/appropriate and lieu of barium study (patient not interested in).  Also, mild bump in LFTs reported recently with normal labs last month.  Fatty liver on ultrasound without other concerning stigmata. Discussed fatty liver with patient at length.  Good to see her LFTs well within normal limits on last assay. Really no risk factors for viral hepatitis.  May well just have benign fatty liver disease (steatosis).  She needs a one-time screen for hepatitis C.   Recommendations:  As discussed, we will schedule an EGD (upper endoscopy) with possible esophageal dilation in  the near future.  ASA 3/propofol.  The risks, benefits, limitations, alternatives and imponderables have been reviewed with the patient. Potential for esophageal dilation, biopsy, etc. have also been reviewed.  Questions have been answered. She is agreeable.   No change in medication regimen at this time.  Strive for regular aerobic exercise 3 times weekly.  Weight loss  -  goal of 5% over the next 12 months recommendations:  Regular coffee use-1 to 2 cups daily as you are already doing is good for your liver  Hepatic function profile in 6 months  Hepatitis C antibody screen now  Repeat colonoscopy in 5 years for screening purposes  Further recommendations to follow.   Notice: This dictation was prepared with Dragon dictation along with smaller phrase technology. Any transcriptional errors that result from this process are unintentional and may not be corrected upon review.

## 2021-03-19 ENCOUNTER — Encounter: Payer: Self-pay | Admitting: *Deleted

## 2021-03-19 DIAGNOSIS — Z1159 Encounter for screening for other viral diseases: Secondary | ICD-10-CM | POA: Diagnosis not present

## 2021-03-20 LAB — HEPATIC FUNCTION PANEL
AG Ratio: 1.7 (calc) (ref 1.0–2.5)
ALT: 10 U/L (ref 6–29)
AST: 13 U/L (ref 10–35)
Albumin: 4.5 g/dL (ref 3.6–5.1)
Alkaline phosphatase (APISO): 81 U/L (ref 37–153)
Bilirubin, Direct: 0.1 mg/dL (ref 0.0–0.2)
Globulin: 2.6 g/dL (calc) (ref 1.9–3.7)
Indirect Bilirubin: 0.4 mg/dL (calc) (ref 0.2–1.2)
Total Bilirubin: 0.5 mg/dL (ref 0.2–1.2)
Total Protein: 7.1 g/dL (ref 6.1–8.1)

## 2021-03-20 LAB — HEPATITIS C ANTIBODY
Hepatitis C Ab: NONREACTIVE
SIGNAL TO CUT-OFF: <0.02 (ref <1.00)

## 2021-03-22 ENCOUNTER — Encounter: Payer: Self-pay | Admitting: Nurse Practitioner

## 2021-03-22 ENCOUNTER — Other Ambulatory Visit: Payer: Self-pay

## 2021-03-22 ENCOUNTER — Ambulatory Visit (INDEPENDENT_AMBULATORY_CARE_PROVIDER_SITE_OTHER): Payer: BC Managed Care – PPO | Admitting: Nurse Practitioner

## 2021-03-22 VITALS — BP 142/82 | HR 60 | Wt 173.0 lb

## 2021-03-22 DIAGNOSIS — E785 Hyperlipidemia, unspecified: Secondary | ICD-10-CM | POA: Diagnosis not present

## 2021-03-22 DIAGNOSIS — W19XXXA Unspecified fall, initial encounter: Secondary | ICD-10-CM

## 2021-03-22 DIAGNOSIS — M25562 Pain in left knee: Secondary | ICD-10-CM

## 2021-03-22 DIAGNOSIS — I159 Secondary hypertension, unspecified: Secondary | ICD-10-CM | POA: Diagnosis not present

## 2021-03-22 DIAGNOSIS — G629 Polyneuropathy, unspecified: Secondary | ICD-10-CM | POA: Diagnosis not present

## 2021-03-22 DIAGNOSIS — M4800 Spinal stenosis, site unspecified: Secondary | ICD-10-CM | POA: Diagnosis not present

## 2021-03-22 DIAGNOSIS — M25511 Pain in right shoulder: Secondary | ICD-10-CM

## 2021-03-22 NOTE — Patient Instructions (Signed)

## 2021-03-22 NOTE — Assessment & Plan Note (Addendum)
Patient reports falling twice recently.  States that she has spinal stenosis ?Reports right shoulder pain and left knee pain due to fall. ?Able to do range of motion of right shoulder and left knee ?Refused x-ray today   On Flexeril 10 mg daily as needed. ?Need to prevent falls discussed with patient she verbalized understanding.  ?

## 2021-03-22 NOTE — Assessment & Plan Note (Signed)
Chronic condition, states that she was being followed by neurosurgery while in [place] takes Flexeril 10 mg daily as needed. ?Patient reports multiple falls recently ?Need to prevent falls discussed with patient she verbalized understanding. ?Patient referred to neurosurgery. ?

## 2021-03-22 NOTE — Assessment & Plan Note (Signed)
BP Readings from Last 3 Encounters:  ?03/22/21 (!) 142/82  ?03/16/21 126/72  ?01/15/21 130/70  ?Takes carvedilol 12.5 mg twice daily, clonidine 0.1 mg 3 times daily, hydralazine 100 mg twice daily, spironolactone 25 mg daily.  Followed by cardiology. ?DASH diet advised engage in regular exercise as tolerated. ?Check CMP plus EGFR at next visit. ?

## 2021-03-22 NOTE — Assessment & Plan Note (Addendum)
Recently started on Zetia 10 mg daily.  Omega-3 fatty acid 1000 mg daily ?We will check lipid panel at her next visit. ?

## 2021-03-22 NOTE — Progress Notes (Signed)
? ?New Patient Office Visit ? ?Subjective:  ?Patient ID: Erica Gallegos, female    DOB: 30-Aug-1955  Age: 66 y.o. MRN: [id number] ? ?CC:  ?Chief Complaint  ?Patient presents with  ? New Patient (Initial Visit)  ? Shoulder Pain  ?  Right shoulder pain from fall 3/14  ? ? ?HPI ?Erica Gallegos with medical history of hypertension, GERD, dysphagia presents to establish care. Previous PCP was [facility], last visit to them was last week. She was recently started on cholesterol medications. Last wellness exam was a month ago. ? ? ?Spinal stenosis. She fell twice 2 days ago while walking across some rafters , fell on her left knee, she can not remember if she fell on her her head. Pt states that both her legs do feel woobly after walking for a while. Has intermittent numbness and tingling in both legs. States that she was seeing a Publishing rights manager while she was in [place]. Has aching pain 3/10, pain worse with walking . She also fell on her right shoulder , has aching pain 2/10.  Takes Flexeril 10 mg daily as needed ? ?Hypertension.  Takes carvedilol 12.5 mg twice daily [place] 25 mg daily, hydralazine 100 mg twice daily lisinopril 40 mg daily Aldactone 25 mg daily clonidine 0.1 mg 3 times daily.  Followed by cardiology patient denies chest pain dizziness edema.  ? ?[facility] health issues.  ? ?HLD.  Patient states that she was recently started on Zetia$RemoveBeforeD'10mg'SWCmboSuInyQRi$  daily, omega-3 fatty acids 1000 mg daily, denies adverse reaction to medication. ? ?She is seeing GI for elevated liver enzymes, has an upcoming appointment.  Patient denies abdominal pain nausea vomiting diarrhea. Patient states that she had normal colonoscopy in the past 5 years. ? ? ? ?Past Medical History:  ?Diagnosis Date  ? Anxiety   ? Arthritis   ? GERD (gastroesophageal reflux disease)   ? HLD (hyperlipidemia)   ? HTN (hypertension)   ? Major depressive disorder   ? Migraine with aura    ? RAS (renal artery stenosis) ([place])   ? Sleep apnea   ? Sleep related hypoxia   ? Spinal stenosis   ? ? ?Past Surgical History:  ?Procedure Laterality Date  ? BUNIONECTOMY    ? CATARACT EXTRACTION    ? CHOLECYSTECTOMY    ? ? ?Family History  ?Problem Relation Age of Onset  ? Hypertension Mother   ? Diabetes Mother   ? Heart disease Mother   ? Stroke Father   ? Hypertension Father   ? Heart disease Father   ? Hypertension Sister   ? Lupus Sister   ? Hypertension Sister   ? Cancer Brother   ? Brain cancer Brother   ? Non-Hodgkin's lymphoma Brother   ? Colon cancer Neg Hx   ? ? ?Social History  ? ?Socioeconomic History  ? Marital status: Married  ?  Spouse name: Not on file  ? Number of children: 3  ? Years of education: Not on file  ? Highest education level: Not on file  ?Occupational History  ? Not on file  ?Tobacco Use  ? Smoking status: Former  ?  Types: Cigarettes  ? Smokeless tobacco: Never  ? Tobacco comments:  ?  She quit smoking in the early 90's  ?Substance and Sexual Activity  ? Alcohol use: Yes  ?  Comment: seldom  ? Drug use: Yes  ?  Types: Marijuana  ?  Comment:  CBD cream; Marijuana a couple times a week. helps with her anxiety and migraines.  ? Sexual activity: Yes  ?  Birth control/protection: None, Post-menopausal  ?Other Topics Concern  ? Not on file  ?Social History Narrative  ? Lives with her husband, retired.   ? ?Social Determinants of Health  ? ?Financial Resource Strain: Unknown  ? Difficulty of Paying Living Expenses: Patient refused  ?Food Insecurity: Unknown  ? Worried About Charity fundraiser in the Last Year: Patient refused  ? Ran Out of Food in the Last Year: Patient refused  ?Transportation Needs: Unknown  ? Lack of Transportation (Medical): Patient refused  ? Lack of Transportation (Non-Medical): Patient refused  ?Physical Activity: Unknown  ? Days of Exercise per Week: Patient refused  ? Minutes of Exercise per Session: Patient refused  ?Stress: Unknown  ? Feeling of Stress :  Patient refused  ?Social Connections: Unknown  ? Frequency of Communication with Friends and Family: Patient refused  ? Frequency of Social Gatherings with Friends and Family: Patient refused  ? Attends Religious Services: Patient refused  ? Active Member of Clubs or Organizations: Patient refused  ? Attends Archivist Meetings: Patient refused  ? Marital Status: Patient refused  ?Intimate Partner Violence: Unknown  ? Fear of Current or Ex-Partner: Patient refused  ? Emotionally Abused: Patient refused  ? Physically Abused: Patient refused  ? Sexually Abused: Patient refused  ? ? ?ROS ?Review of Systems  ?Constitutional: Negative.   ?Respiratory: Negative.    ?Cardiovascular: Negative.   ?Gastrointestinal: Negative.   ?Musculoskeletal:  Positive for arthralgias and gait problem. Negative for joint swelling, myalgias, neck pain and neck stiffness.  ?Psychiatric/Behavioral: Negative.    ? ?Objective:  ? ?Today's Vitals: BP (!) 142/82 (BP Location: Right Arm, Cuff Size: Normal)   Pulse 60   Wt 173 lb (78.5 kg)   SpO2 99%   BMI 27.92 kg/m?  ? ?Physical Exam ?Constitutional:   ?   General: She is not in acute distress. ?   Appearance: Normal appearance. She is not ill-appearing, toxic-appearing or diaphoretic.  ?Cardiovascular:  ?   Rate and Rhythm: Normal rate and regular rhythm.  ?   Pulses: Normal pulses.  ?   Heart sounds: Normal heart sounds. No murmur heard. ?  No friction rub. No gallop.  ?Pulmonary:  ?   Effort: Pulmonary effort is normal. No respiratory distress.  ?   Breath sounds: Normal breath sounds. No stridor. No wheezing, rhonchi or rales.  ?Chest:  ?   Chest wall: No tenderness.  ?Abdominal:  ?   Palpations: Abdomen is soft.  ?Musculoskeletal:  ?   Comments: Tenderness on range of motion of right shoulder left knee.  ?Neurological:  ?   Mental Status: She is alert and oriented to person, place, and time.  ?   Cranial Nerves: No cranial nerve deficit.  ?   Sensory: No sensory deficit.  ?    Motor: No weakness.  ?   Coordination: Coordination normal.  ?   Gait: Gait normal.  ?   Deep Tendon Reflexes: Reflexes normal.  ?Psychiatric:     ?   Mood and Affect: Mood normal.     ?   Behavior: Behavior normal.     ?   Thought Content: Thought content normal.     ?   Judgment: Judgment normal.  ? ? ?Assessment & Plan:  ? ?Problem List Items Addressed This Visit   ? ?  ? Cardiovascular and  Mediastinum  ? Hypertension  ?  BP Readings from Last 3 Encounters:  ?03/22/21 (!) 142/82  ?03/16/21 126/72  ?01/15/21 130/70  ?Takes carvedilol 12.5 mg twice daily, clonidine 0.1 mg 3 times daily, hydralazine 100 mg twice daily, spironolactone 25 mg daily.  Followed by cardiology. ?DASH diet advised engage in regular exercise as tolerated. ?Check CMP plus EGFR at next visit. ?  ?  ? Relevant Medications  ? ezetimibe (ZETIA) 10 MG tablet  ? cloNIDine (CATAPRES) 0.1 MG tablet  ?  ? Nervous and Auditory  ? Neuropathy  ?  Bilateral hands. ?Takes Neurontin 1 to 2 capsule 3 times daily. ?Continue current medication ?  ?  ?  ? Other  ? Hyperlipidemia  ?  Recently started on Zetia 10 mg daily.  Omega-3 fatty acid 1000 mg daily ?We will check lipid panel at her next visit. ?  ?  ? Relevant Medications  ? ezetimibe (ZETIA) 10 MG tablet  ? cloNIDine (CATAPRES) 0.1 MG tablet  ? Spinal stenosis - Primary  ?  Chronic condition, states that she was being followed by neurosurgery while in [place] takes Flexeril 10 mg daily as needed. ?Patient reports multiple falls recently ?Need to prevent falls discussed with patient she verbalized understanding. ?Patient referred to neurosurgery. ?  ?  ? Relevant Orders  ? Ambulatory referral to Neurosurgery  ? Fall  ?  Patient reports falling twice recently.  States that she has spinal stenosis ?Reports right shoulder pain and left knee pain due to fall. ?Able to do range of motion of right shoulder and left knee ?Refused x-ray today   On Flexeril 10 mg daily as needed. ?Need to prevent falls  discussed with patient she verbalized understanding.  ?  ?  ? Right shoulder pain  ?  On Flexeril 10 mg daily as needed.  Need to avoid falls discussed with patient. ?  ?  ? Left knee pain  ?  Due to recent fall, on F

## 2021-03-22 NOTE — Assessment & Plan Note (Addendum)
On Flexeril 10 mg daily as needed.  Need to avoid falls discussed with patient. ?

## 2021-03-22 NOTE — Assessment & Plan Note (Signed)
Due to recent fall, on Flexeril 10 mg as needed, refused x-ray need to avoid falls discussed with patient she verbalized understanding.  ?

## 2021-03-22 NOTE — Assessment & Plan Note (Addendum)
Bilateral hands. ?Takes Neurontin 1 to 2 capsule 3 times daily. ?Continue current medication ?

## 2021-03-28 DIAGNOSIS — F4310 Post-traumatic stress disorder, unspecified: Secondary | ICD-10-CM | POA: Diagnosis not present

## 2021-04-02 ENCOUNTER — Ambulatory Visit: Payer: BC Managed Care – PPO | Admitting: Pulmonary Disease

## 2021-04-02 DIAGNOSIS — R202 Paresthesia of skin: Secondary | ICD-10-CM | POA: Diagnosis not present

## 2021-04-02 DIAGNOSIS — R200 Anesthesia of skin: Secondary | ICD-10-CM | POA: Diagnosis not present

## 2021-04-02 DIAGNOSIS — G8929 Other chronic pain: Secondary | ICD-10-CM | POA: Diagnosis not present

## 2021-04-02 DIAGNOSIS — M545 Low back pain, unspecified: Secondary | ICD-10-CM | POA: Diagnosis not present

## 2021-04-02 NOTE — Patient Instructions (Signed)
? ? ? ? ? ? ? ? Erica Gallegos ? 04/02/2021  ?  ? @PREFPERIOPPHARMACY @ ? ? Your procedure is scheduled on  04/05/2021. ? ? Report to [facility] at  0900 A.M. ? ? Call this number if you have problems the morning of surgery: ? [phone] ? ? Remember: ? Follow the diet instructions given to you by the office. ? ?  ? Take these medicines the morning of surgery with A SIP OF WATER  ? ?wellbutrin, carvedilol, clonazepam, cymbalta, gabapentin, prevacid. ? ?  ? Do not wear jewelry, make-up or nail polish. ? Do not wear lotions, powders, or perfumes, or deodorant. ? Do not shave 48 hours prior to surgery.  Men may shave face and neck. ? Do not bring valuables to the hospital. ? [name] is not responsible for any belongings or valuables. ? ?Contacts, dentures or bridgework may not be worn into surgery.  Leave your suitcase in the car.  After surgery it may be brought to your room. ? ?For patients admitted to the hospital, discharge time will be determined by your treatment team. ? ?Patients discharged the day of surgery will not be allowed to drive home and must have someone with them for 24 hours.  ? ? ?Special instructions:   DO NOT smoke tobacco or vape for 4 hours before your procedure. ? ?Please read over the following fact sheets that you were given. ?Anesthesia Post-op Instructions and Care and Recovery After Surgery ?  ? ? ? Upper Endoscopy, Adult, Care After ?This sheet gives you information about how to care for yourself after your procedure. Your health care provider may also give you more specific instructions. If you have problems or questions, contact your health care provider. ?What can I expect after the procedure? ?After the procedure, it is common to have: ?A sore throat. ?Mild stomach pain or discomfort. ?Bloating. ?Nausea. ?Follow these instructions at home: ? ?Follow instructions from your health care provider about what to eat or drink after your procedure. ?Return to your normal activities as  told by your health care provider. Ask your health care provider what activities are safe for you. ?Take over-the-counter and prescription medicines only as told by your health care provider. ?If you were given a sedative during the procedure, it can affect you for several hours. Do not drive or operate machinery until your health care provider says that it is safe. ?Keep all follow-up visits as told by your health care provider. This is important. ?Contact a health care provider if you have: ?A sore throat that lasts longer than one day. ?Trouble swallowing. ?Get help right away if: ?You vomit blood or your vomit looks like coffee grounds. ?You have: ?A fever. ?Bloody, black, or tarry stools. ?A severe sore throat or you cannot swallow. ?Difficulty breathing. ?Severe pain in your chest or abdomen. ?Summary ?After the procedure, it is common to have a sore throat, mild stomach discomfort, bloating, and nausea. ?If you were given a sedative during the procedure, it can affect you for several hours. Do not drive or operate machinery until your health care provider says that it is safe. ?Follow instructions from your health care provider about what to eat or drink after your procedure. ?Return to your normal activities as told by your health care provider. ?This information is not intended to replace advice given to you by your health care provider. Make sure you discuss any questions you have with your health care provider. ?Document Revised: 10/30/2018  Document Reviewed: 05/26/2017 ?Elsevier Patient Education ? 2022 [employer] ?Esophageal Dilatation ?Esophageal dilatation, also called esophageal dilation, is a procedure to widen or open a blocked or narrowed part of the esophagus. The esophagus is the part of the body that moves food and liquid from the mouth to the stomach. You may need this procedure if: ?You have a buildup of scar tissue in your esophagus that makes it difficult, painful, or impossible to  swallow. This can be caused by gastroesophageal reflux disease (GERD). ?You have cancer of the esophagus. ?There is a problem with how food moves through your esophagus. ?In some cases, you may need this procedure repeated at a later time to dilate the esophagus gradually. ?Tell a health care provider about: ?Any allergies you have. ?All medicines you are taking, including vitamins, herbs, eye drops, creams, and over-the-counter medicines. ?Any problems you or family members have had with anesthetic medicines. ?Any blood disorders you have. ?Any surgeries you have had. ?Any medical conditions you have. ?Any antibiotic medicines you are required to take before dental procedures. ?Whether you are pregnant or may be pregnant. ?What are the risks? ?Generally, this is a safe procedure. However, problems may occur, including: ?Bleeding due to a tear in the lining of the esophagus. ?A hole, or perforation, in the esophagus. ?What happens before the procedure? ?Ask your health care provider about: ?Changing or stopping your regular medicines. This is especially important if you are taking diabetes medicines or blood thinners. ?Taking medicines such as aspirin and ibuprofen. These medicines can thin your blood. Do not take these medicines unless your health care provider tells you to take them. ?Taking over-the-counter medicines, vitamins, herbs, and supplements. ?Follow instructions from your health care provider about eating or drinking restrictions. ?Plan to have a responsible adult take you home from the hospital or clinic. ?Plan to have a responsible adult care for you for the time you are told after you leave the hospital or clinic. This is important. ?What happens during the procedure? ?You may be given a medicine to help you relax (sedative). ?A numbing medicine may be sprayed into the back of your throat, or you may gargle the medicine. ?Your health care provider may perform the dilatation using various surgical  instruments, such as: ?Simple dilators. This instrument is carefully placed in the esophagus to stretch it. ?Guided wire bougies. This involves using an endoscope to insert a wire into the esophagus. A dilator is passed over this wire to enlarge the esophagus. Then the wire is removed. ?Balloon dilators. An endoscope with a small balloon is inserted into the esophagus. The balloon is inflated to stretch the esophagus and open it up. ?The procedure may vary among health care providers and hospitals. ?What can I expect after the procedure? ?Your blood pressure, heart rate, breathing rate, and blood oxygen level will be monitored until you leave the hospital or clinic. ?Your throat may feel slightly sore and numb. This will get better over time. ?You will not be allowed to eat or drink until your throat is no longer numb. ?When you are able to drink, urinate, and sit on the edge of the bed without nausea or dizziness, you may be able to return home. ?Follow these instructions at home: ?Take over-the-counter and prescription medicines only as told by your health care provider. ?If you were given a sedative during the procedure, it can affect you for several hours. Do not drive or operate machinery until your health care provider says that it  is safe. ?Plan to have a responsible adult care for you for the time you are told. This is important. ?Follow instructions from your health care provider about any eating or drinking restrictions. ?Do not use any products that contain nicotine or tobacco, such as cigarettes, e-cigarettes, and chewing tobacco. If you need help quitting, ask your health care provider. ?Keep all follow-up visits. This is important. ?Contact a health care provider if: ?You have a fever. ?You have pain that is not relieved by medicine. ?Get help right away if: ?You have chest pain. ?You have trouble breathing. ?You have trouble swallowing. ?You vomit blood. ?You have black, tarry, or bloody  stools. ?These symptoms may represent a serious problem that is an emergency. Do not wait to see if the symptoms will go away. Get medical help right away. Call your local emergency services (911 in the [place] Do not dri

## 2021-04-03 ENCOUNTER — Encounter (HOSPITAL_COMMUNITY): Payer: Self-pay

## 2021-04-03 ENCOUNTER — Encounter (HOSPITAL_COMMUNITY)
Admission: RE | Admit: 2021-04-03 | Discharge: 2021-04-03 | Disposition: A | Discharge status: Home / Self Care | Payer: BC Managed Care – PPO | Source: Ambulatory Visit | Attending: Internal Medicine | Admitting: Internal Medicine

## 2021-04-03 VITALS — BP 144/79 | HR 57 | Temp 98.4°F | Resp 18 | Ht 66.0 in | Wt 173.0 lb

## 2021-04-03 DIAGNOSIS — Z79899 Other long term (current) drug therapy: Secondary | ICD-10-CM | POA: Insufficient documentation

## 2021-04-03 DIAGNOSIS — I739 Peripheral vascular disease, unspecified: Secondary | ICD-10-CM | POA: Diagnosis not present

## 2021-04-03 DIAGNOSIS — G4730 Sleep apnea, unspecified: Secondary | ICD-10-CM | POA: Diagnosis not present

## 2021-04-03 DIAGNOSIS — F419 Anxiety disorder, unspecified: Secondary | ICD-10-CM | POA: Diagnosis not present

## 2021-04-03 DIAGNOSIS — K222 Esophageal obstruction: Secondary | ICD-10-CM | POA: Diagnosis not present

## 2021-04-03 DIAGNOSIS — K449 Diaphragmatic hernia without obstruction or gangrene: Secondary | ICD-10-CM | POA: Diagnosis not present

## 2021-04-03 DIAGNOSIS — F32A Depression, unspecified: Secondary | ICD-10-CM | POA: Diagnosis not present

## 2021-04-03 DIAGNOSIS — K5900 Constipation, unspecified: Secondary | ICD-10-CM | POA: Diagnosis not present

## 2021-04-03 DIAGNOSIS — K760 Fatty (change of) liver, not elsewhere classified: Secondary | ICD-10-CM | POA: Diagnosis not present

## 2021-04-03 DIAGNOSIS — Z01812 Encounter for preprocedural laboratory examination: Secondary | ICD-10-CM | POA: Insufficient documentation

## 2021-04-03 DIAGNOSIS — R1310 Dysphagia, unspecified: Secondary | ICD-10-CM | POA: Diagnosis not present

## 2021-04-03 DIAGNOSIS — K219 Gastro-esophageal reflux disease without esophagitis: Secondary | ICD-10-CM | POA: Diagnosis not present

## 2021-04-03 DIAGNOSIS — I10 Essential (primary) hypertension: Secondary | ICD-10-CM | POA: Diagnosis not present

## 2021-04-03 DIAGNOSIS — Z87891 Personal history of nicotine dependence: Secondary | ICD-10-CM | POA: Diagnosis not present

## 2021-04-03 HISTORY — DX: Other specified postprocedural states: Z98.890

## 2021-04-03 HISTORY — DX: Nausea with vomiting, unspecified: R11.2

## 2021-04-03 HISTORY — DX: Other complications of anesthesia, initial encounter: T88.59XA

## 2021-04-03 LAB — BASIC METABOLIC PANEL
Anion gap: 5 (ref 5–15)
BUN: 28 mg/dL — ABNORMAL HIGH (ref 8–23)
CO2: 25 mmol/L (ref 22–32)
Calcium: 9.6 mg/dL (ref 8.9–10.3)
Chloride: 106 mmol/L (ref 98–111)
Creatinine, Ser: 1.22 mg/dL — ABNORMAL HIGH (ref 0.44–1.00)
GFR, Estimated: 50 mL/min — ABNORMAL LOW (ref >60)
Glucose, Bld: 64 mg/dL — ABNORMAL LOW (ref 70–99)
Potassium: 4.6 mmol/L (ref 3.5–5.1)
Sodium: 136 mmol/L (ref 135–145)

## 2021-04-04 ENCOUNTER — Encounter: Payer: Self-pay | Admitting: Pulmonary Disease

## 2021-04-04 ENCOUNTER — Other Ambulatory Visit (HOSPITAL_COMMUNITY): Payer: Self-pay | Admitting: Neurological Surgery

## 2021-04-04 ENCOUNTER — Ambulatory Visit: Payer: BC Managed Care – PPO | Admitting: Pulmonary Disease

## 2021-04-04 ENCOUNTER — Telehealth: Payer: Self-pay | Admitting: Nurse Practitioner

## 2021-04-04 ENCOUNTER — Other Ambulatory Visit: Payer: Self-pay

## 2021-04-04 VITALS — BP 136/82 | HR 56 | Temp 98.4°F | Ht 66.0 in | Wt 174.8 lb

## 2021-04-04 DIAGNOSIS — G4733 Obstructive sleep apnea (adult) (pediatric): Secondary | ICD-10-CM

## 2021-04-04 DIAGNOSIS — G4700 Insomnia, unspecified: Secondary | ICD-10-CM

## 2021-04-04 DIAGNOSIS — G8929 Other chronic pain: Secondary | ICD-10-CM

## 2021-04-04 DIAGNOSIS — G4730 Sleep apnea, unspecified: Secondary | ICD-10-CM | POA: Diagnosis not present

## 2021-04-04 DIAGNOSIS — R200 Anesthesia of skin: Secondary | ICD-10-CM

## 2021-04-04 MED ORDER — CYCLOBENZAPRINE HCL 10 MG PO TABS: 10.0000 mg | ORAL_TABLET | Freq: Every evening | ORAL | 0 refills | Status: DC | PRN

## 2021-04-04 NOTE — Patient Instructions (Signed)
Will call with results of CPAP download ? ?You can download the [facility] APP and see if you can connect your CPAP machine to your phone ? ?Follow up in 1 year ?

## 2021-04-04 NOTE — Telephone Encounter (Signed)
Patient needs refill on  ? ?cyclobenzaprine (FLEXERIL) 10 MG tablet  ?

## 2021-04-04 NOTE — Progress Notes (Signed)
? ?[facility] ? ?Chief Complaint  ?Patient presents with  ? Follow-up  ?  CPAP working well but needs supplies.  ?CPAP machine does not have an SD card for a download.   ? ? ?Past Surgical History:  ?She  has a past surgical history that includes Cholecystectomy; Bunionectomy; and Cataract extraction. ? ?Past Medical History:  ?HTN, GERD, HLD, Anxiety, Depression, Migraine headaches, Renal artery stenosis, Spinal stenosis ? ?Constitutional:  ?BP 136/82 (BP Location: Left Arm, Patient Position: Sitting)   Pulse (!) 56   Temp 98.4 ?F (36.9 ?C) (Temporal)   Ht 5\' 6"  (1.676 m)   Wt 174 lb 12.8 oz (79.3 kg)   SpO2 99% Comment: rs  BMI 28.21 kg/m?  ? ?Brief Summary:  ?Erica Gallegos is a 66 y.o. female smoker with sleep apnea. ?  ? ? ? ?Subjective:  ? ?She uses CPAP nightly.  Pressure sometimes feels a little low.  Has full face mask and uses chin strap.  Her husband says she snores sometimes still if she doesn't use the chin strap.  Mask is comfortable otherwise.  She feels rested. ? ?Physical Exam:  ? ?Appearance - well kempt  ? ?ENMT - no sinus tenderness, no oral exudate, no LAN, Mallampati 3 airway, no stridor, wears dentures ? ?Respiratory - equal breath sounds bilaterally, no wheezing or rales ? ?CV - s1s2 regular rate and rhythm, no murmurs ? ?Ext - no clubbing, no edema ? ?Skin - no rashes ? ?Psych - normal mood and affect ?  ?Sleep Tests:  ?HST 05/29/17 >> AHI 22, SpO2 low 78% ? ?Social History:  ?She  reports that she has quit smoking. Her smoking use included cigarettes. She has never used smokeless tobacco. She reports current alcohol use. She reports current drug use. Drug: Marijuana. ? ?Family History:  ?Her family history includes Brain cancer in her brother; Cancer in her brother; Diabetes in her mother; Heart disease in her father and mother; Hypertension in her father, mother, sister, and sister; Lupus in her sister; Non-Hodgkin's lymphoma in her  brother; Stroke in her father. ?  ? ? ?Assessment/Plan:  ? ?Snoring with excessive daytime sleepiness with history of obstructive sleep apnea. ?- she is compliant with CPAP and reports benefit from therapy ?- she uses [facility] for her DME ?- will get a copy of her download and call her with results ?- she should be eligible for a new machine in 2024 ? ?Insomnia. ?- she uses melatonin at night ? ?Resistant hypertension. ?- followed by Dr. Carlyle Dolly with [facility] ? ?Time Spent Involved in Patient Care on Day of Examination:  ?25 minutes ? ?Follow up:  ? ?Patient Instructions  ?Will call with results of CPAP download ? ?You can download the [facility]med APP and see if you can connect your CPAP machine to your phone ? ?Follow up in 1 year ? ?Medication List:  ? ?Allergies as of 04/04/2021   ? ?   Reactions  ? Amlodipine Swelling  ? ?  ? ?  ?Medication List  ?  ? ?  ? Accurate as of April 04, 2021 11:06 AM. If you have any questions, ask your nurse or doctor.  ?  ?  ? ?  ? ?STOP taking these medications   ? ?DRY EYE RELIEF OP ?Stopped by: Chesley Mires, MD ?  ? ?  ? ?TAKE these medications   ? ?buPROPion 200 MG 12 hr tablet ?Commonly known as:  WELLBUTRIN SR ?Take 200 mg by mouth 2 (two) times daily. ?  ?carvedilol 12.5 MG tablet ?Commonly known as: COREG ?Take 1 tablet (12.5 mg total) by mouth 2 (two) times daily. ?  ?chlorthalidone 25 MG tablet ?Commonly known as: HYGROTON ?TAKE 1 AND 1/2 TABLETS(37.5 MG) BY MOUTH DAILY ?  ?clonazePAM 0.5 MG tablet ?Commonly known as: KLONOPIN ?Take 0.5 mg by mouth daily as needed for anxiety. ?  ?cyclobenzaprine 10 MG tablet ?Commonly known as: FLEXERIL ?Take 10 mg by mouth at bedtime as needed (back pain/nerves). ?  ?DULoxetine 60 MG capsule ?Commonly known as: CYMBALTA ?Take 60 mg by mouth 2 (two) times daily. ?  ?ezetimibe 10 MG tablet ?Commonly known as: ZETIA ?Take 10 mg by mouth daily. ?  ?Fish Oil 1000 MG Caps ?Take 4,000 mg by mouth daily. ?  ?gabapentin 300 MG  capsule ?Commonly known as: NEURONTIN ?Take 300-600 capsules by mouth See admin instructions. 300 mg in the morning and 600 mg at bedtime additional 300 mg if needed during the day ?  ?hydrALAZINE 100 MG tablet ?Commonly known as: APRESOLINE ?Take 100 mg by mouth 2 (two) times daily. ?  ?lamoTRIgine 25 MG tablet ?Commonly known as: LAMICTAL ?Take 50 mg by mouth every morning. ?  ?lansoprazole 30 MG capsule ?Commonly known as: PREVACID ?TAKE 1 CAPSULE(30 MG) BY MOUTH TWICE DAILY BEFORE A MEAL ?  ?lisinopril 40 MG tablet ?Commonly known as: ZESTRIL ?Take 40 mg by mouth daily. ?  ?melatonin 5 MG Tabs ?Take 5 mg by mouth at bedtime as needed (Sleep). ?  ?spironolactone 25 MG tablet ?Commonly known as: ALDACTONE ?Take 1 tablet (25 mg total) by mouth daily. ?  ?vitamin B-12 500 MCG tablet ?Commonly known as: CYANOCOBALAMIN ?Take 500 mcg by mouth daily. ?  ?vitamin C 500 MG tablet ?Commonly known as: ASCORBIC ACID ?Take 500 mg by mouth daily. ?  ?Vitamin D3 50 MCG (2000 UT) Tabs ?Take 2,000 Units by mouth daily. ?  ? ?  ? ? ?Signature:  ?Chesley Mires, MD ?[facility] ?Pager - [phone] - [phone] ?04/04/2021, 11:06 AM ?  ? ? ? ? ? ? ? ? ?

## 2021-04-04 NOTE — Telephone Encounter (Signed)
Refilled sent to pharmacy. Pt has been informed.  ?

## 2021-04-04 NOTE — Telephone Encounter (Signed)
Pcp out of the office, Please advice if okay to fill?

## 2021-04-05 ENCOUNTER — Ambulatory Visit (HOSPITAL_COMMUNITY): Payer: BC Managed Care – PPO | Admitting: Anesthesiology

## 2021-04-05 ENCOUNTER — Ambulatory Visit (HOSPITAL_COMMUNITY)
Admission: RE | Admit: 2021-04-05 | Discharge: 2021-04-05 | Disposition: A | Discharge status: Home / Self Care | Payer: BC Managed Care – PPO | Attending: Internal Medicine | Admitting: Internal Medicine

## 2021-04-05 ENCOUNTER — Encounter (HOSPITAL_COMMUNITY)
Admission: RE | Disposition: A | Discharge status: Home / Self Care | Payer: Self-pay | Source: Home / Self Care | Attending: Internal Medicine

## 2021-04-05 ENCOUNTER — Other Ambulatory Visit: Payer: Self-pay | Admitting: Cardiology

## 2021-04-05 DIAGNOSIS — G4730 Sleep apnea, unspecified: Secondary | ICD-10-CM | POA: Diagnosis not present

## 2021-04-05 DIAGNOSIS — Z87891 Personal history of nicotine dependence: Secondary | ICD-10-CM | POA: Diagnosis not present

## 2021-04-05 DIAGNOSIS — K449 Diaphragmatic hernia without obstruction or gangrene: Secondary | ICD-10-CM | POA: Diagnosis not present

## 2021-04-05 DIAGNOSIS — F419 Anxiety disorder, unspecified: Secondary | ICD-10-CM | POA: Diagnosis not present

## 2021-04-05 DIAGNOSIS — I10 Essential (primary) hypertension: Secondary | ICD-10-CM | POA: Diagnosis not present

## 2021-04-05 DIAGNOSIS — I739 Peripheral vascular disease, unspecified: Secondary | ICD-10-CM | POA: Diagnosis not present

## 2021-04-05 DIAGNOSIS — K760 Fatty (change of) liver, not elsewhere classified: Secondary | ICD-10-CM | POA: Diagnosis not present

## 2021-04-05 DIAGNOSIS — K219 Gastro-esophageal reflux disease without esophagitis: Secondary | ICD-10-CM | POA: Diagnosis not present

## 2021-04-05 DIAGNOSIS — E785 Hyperlipidemia, unspecified: Secondary | ICD-10-CM | POA: Diagnosis not present

## 2021-04-05 DIAGNOSIS — K5900 Constipation, unspecified: Secondary | ICD-10-CM | POA: Diagnosis not present

## 2021-04-05 DIAGNOSIS — R1310 Dysphagia, unspecified: Secondary | ICD-10-CM | POA: Diagnosis not present

## 2021-04-05 DIAGNOSIS — F32A Depression, unspecified: Secondary | ICD-10-CM | POA: Insufficient documentation

## 2021-04-05 DIAGNOSIS — K222 Esophageal obstruction: Secondary | ICD-10-CM | POA: Diagnosis not present

## 2021-04-05 HISTORY — PX: ESOPHAGOGASTRODUODENOSCOPY (EGD) WITH PROPOFOL: SHX5813

## 2021-04-05 HISTORY — PX: MALONEY DILATION: SHX5535

## 2021-04-05 SURGERY — ESOPHAGOGASTRODUODENOSCOPY (EGD) WITH PROPOFOL
Anesthesia: General

## 2021-04-05 MED ORDER — LACTATED RINGERS IV SOLN: INTRAVENOUS | Status: DC

## 2021-04-05 MED ORDER — PROPOFOL 10 MG/ML IV BOLUS
INTRAVENOUS | Status: DC | PRN
  Administered 2021-04-05 (×2): 30 mg via INTRAVENOUS
  Administered 2021-04-05: 150 mg via INTRAVENOUS

## 2021-04-05 MED ORDER — LIDOCAINE HCL (CARDIAC) PF 50 MG/5ML IV SOSY
PREFILLED_SYRINGE | INTRAVENOUS | Status: DC | PRN
  Administered 2021-04-05: 100 mg via INTRAVENOUS

## 2021-04-05 NOTE — Anesthesia Procedure Notes (Signed)
Date/Time: 04/05/2021 10:34 AM ?Performed by: Franco Nones, CRNA ?Pre-anesthesia Checklist: Patient identified, Emergency Drugs available, Suction available, Timeout performed and Patient being monitored ?Patient Re-evaluated:Patient Re-evaluated prior to induction ?Oxygen Delivery Method: Nasal Cannula ? ? ? ? ?

## 2021-04-05 NOTE — Discharge Instructions (Addendum)
EGD ?Discharge instructions ?Please read the instructions outlined below and refer to this sheet in the next few weeks. These discharge instructions provide you with general information on caring for yourself after you leave the hospital. Your doctor may also give you specific instructions. While your treatment has been planned according to the most current medical practices available, unavoidable complications occasionally occur. If you have any problems or questions after discharge, please call your doctor. ?ACTIVITY ?You may resume your regular activity but move at a slower pace for the next 24 hours.  ?Take frequent rest periods for the next 24 hours.  ?Walking will help expel (get rid of) the air and reduce the bloated feeling in your abdomen.  ?No driving for 24 hours (because of the anesthesia (medicine) used during the test).  ?You may shower.  ?Do not sign any important legal documents or operate any machinery for 24 hours (because of the anesthesia used during the test).  ?NUTRITION ?Drink plenty of fluids.  ?You may resume your normal diet.  ?Begin with a light meal and progress to your normal diet.  ?Avoid alcoholic beverages for 24 hours or as instructed by your caregiver.  ?MEDICATIONS ?You may resume your normal medications unless your caregiver tells you otherwise.  ?WHAT YOU CAN EXPECT TODAY ?You may experience abdominal discomfort such as a feeling of fullness or ?gas? pains.  ?FOLLOW-UP ?Your doctor will discuss the results of your test with you.  ?SEEK IMMEDIATE MEDICAL ATTENTION IF ANY OF THE FOLLOWING OCCUR: ?Excessive nausea (feeling sick to your stomach) and/or vomiting.  ?Severe abdominal pain and distention (swelling).  ?Trouble swallowing.  ?Temperature over 101? F (37.8? C).  ?Rectal bleeding or vomiting of blood.   ? ? ?Your esophagus was stretched today.  Hopefully, you will be able to swallow better. ? ?Continue lansoprazole 30 mg daily-best to be taken 30 minutes before meal ? ?Dose  of gabapentin listed may not be accurate; check with your prescribing physician regarding proper dosing before resuming. ? ?Office visit with [place] in 3 months ? ?At patient request, I called Deniece Portela and [phone] -reviewed findings and recommendations ?

## 2021-04-05 NOTE — Interval H&P Note (Signed)
History and Physical Interval Note: ? ?04/05/2021 ?10:32 AM ? ?Erica Gallegos  has presented today for surgery, with the diagnosis of dysphagia.  The various methods of treatment have been discussed with the patient and family. After consideration of risks, benefits and other options for treatment, the patient has consented to  Procedure(s) with comments: ?ESOPHAGOGASTRODUODENOSCOPY (EGD) WITH PROPOFOL (N/A) - 10:15am ?MALONEY DILATION (N/A) as a surgical intervention.  The patient's history has been reviewed, patient examined, no change in status, stable for surgery.  I have reviewed the patient's chart and labs.  Questions were answered to the patient's satisfaction.   ? ? ?Herbie Baltimore Atwood Adcock ? ?No change.  EGD with esophageal dilation as feasible/appropriate per plan. ?EGD ?Discharge instructions ?Please read the instructions outlined below and refer to this sheet in the next few weeks. These discharge instructions provide you with general information on caring for yourself after you leave the hospital. Your doctor may also give you specific instructions. While your treatment has been planned according to the most current medical practices available, unavoidable complications occasionally occur. If you have any problems or questions after discharge, please call your doctor. ?ACTIVITY ?You may resume your regular activity but move at a slower pace for the next 24 hours.  ?Take frequent rest periods for the next 24 hours.  ?Walking will help expel (get rid of) the air and reduce the bloated feeling in your abdomen.  ?No driving for 24 hours (because of the anesthesia (medicine) used during the test).  ?You may shower.  ?Do not sign any important legal documents or operate any machinery for 24 hours (because of the anesthesia used during the test).  ?NUTRITION ?Drink plenty of fluids.  ?You may resume your normal diet.  ?Begin with a light meal and progress to your normal diet.  ?Avoid alcoholic beverages for 24 hours  or as instructed by your caregiver.  ?MEDICATIONS ?You may resume your normal medications unless your caregiver tells you otherwise.  ?WHAT YOU CAN EXPECT TODAY ?You may experience abdominal discomfort such as a feeling of fullness or ?gas? pains.  ?FOLLOW-UP ?Your doctor will discuss the results of your test with you.  ?SEEK IMMEDIATE MEDICAL ATTENTION IF ANY OF THE FOLLOWING OCCUR: ?Excessive nausea (feeling sick to your stomach) and/or vomiting.  ?Severe abdominal pain and distention (swelling).  ?Trouble swallowing.  ?Temperature over 101? F (37.8? C).  ?Rectal bleeding or vomiting of blood.   ?

## 2021-04-05 NOTE — Anesthesia Postprocedure Evaluation (Signed)
Anesthesia Post Note ? ?Patient: Erica Gallegos ? ?Procedure(s) Performed: ESOPHAGOGASTRODUODENOSCOPY (EGD) WITH PROPOFOL ?MALONEY DILATION ? ?Patient location during evaluation: Phase II ?Anesthesia Type: General ?Level of consciousness: awake ?Pain management: pain level controlled ?Vital Signs Assessment: post-procedure vital signs reviewed and stable ?Respiratory status: spontaneous breathing and respiratory function stable ?Cardiovascular status: blood pressure returned to baseline and stable ?Postop Assessment: no headache and no apparent nausea or vomiting ?Anesthetic complications: no ?Comments: Late entry ? ? ?No notable events documented. ? ? ?Last Vitals:  ?Vitals:  ? 04/05/21 0920 04/05/21 1055  ?BP: 116/74 (!) 102/58  ?Pulse: (!) 51 (!) 53  ?Resp: 16 15  ?Temp: 36.7 ?C (!) 36.4 ?C  ?SpO2: 96% 96%  ?  ?Last Pain:  ?Vitals:  ? 04/05/21 1055  ?TempSrc: Axillary  ?PainSc: 0-No pain  ? ? ?  ?  ?  ?  ?  ?  ? ?Windell Norfolk ? ? ? ? ?

## 2021-04-05 NOTE — Op Note (Signed)
[facility] ?Patient Name: Erica Gallegos ?Procedure Date: 04/05/2021 10:19 AM ?MRN: [id number] ?Date of Birth: Oct 01, 1955 ?Attending MD: Gennette Pac , MD ?CSN: [id number] ?Age: 66 ?Admit Type: Outpatient ?Procedure:                Upper GI endoscopy ?Indications:              Dysphagia ?Providers:                Gennette Pac, MD, Jannett Celestine, RN, Elson Areas  ?                          Onalee Hua, Technician ?Referring MD:              ?Medicines:                Propofol per Anesthesia ?Complications:            No immediate complications. ?Estimated Blood Loss:     Estimated blood loss was minimal. ?Procedure:                Pre-Anesthesia Assessment: ?                          - Prior to the procedure, a History and Physical  ?                          was performed, and patient medications and  ?                          allergies were reviewed. The patient's tolerance of  ?                          previous anesthesia was also reviewed. The risks  ?                          and benefits of the procedure and the sedation  ?                          options and risks were discussed with the patient.  ?                          All questions were answered, and informed consent  ?                          was obtained. Prior Anticoagulants: The patient has  ?                          taken no previous anticoagulant or antiplatelet  ?                          agents. ASA Grade Assessment: III - A patient with  ?                          severe systemic disease. After reviewing the risks  ?  and benefits, the patient was deemed in  ?                          satisfactory condition to undergo the procedure. ?                          After obtaining informed consent, the endoscope was  ?                          passed under direct vision. Throughout the  ?                          procedure, the patient's blood pressure, pulse, and  ?                          oxygen saturations  were monitored continuously. The  ?                          GIF-H190 ([id number]) scope was introduced through the  ?                          mouth, and advanced to the second part of duodenum.  ?                          The upper GI endoscopy was accomplished without  ?                          difficulty. The patient tolerated the procedure  ?                          well. ?Scope In: 10:40:13 AM ?Scope Out: 10:48:36 AM ?Total Procedure Duration: 0 hours 8 minutes 23 seconds  ?Findings: ?     A small hiatal hernia was present. no mass. No Barrett's epithelium.  ?     Esophagitis. ?     A mild Schatzki ring was found at the gastroesophageal junction. Gastric  ?     cavity empty. Gastric mucosa appeared normal. Patent pylorus. The scope  ?     was withdrawn. Dilation was performed with a Maloney dilator with no  ?     resistance at 54 Fr. The scope was withdrawn. Dilation was performed  ?     with a Maloney dilator with moderate resistance at 56 Fr. The dilation  ?     site was examined following endoscope reinsertion and showed no change.  ?     Estimated blood loss: none. ?     The duodenal bulb and second portion of the duodenum were normal. ?Impression:               - Small hiatal hernia. ?                          - Mild Schatzki ring. Dilated. ?                          - Normal duodenal bulb and second portion of the  ?  duodenum. ?                          - No specimens collected. ?Moderate Sedation: ?     Moderate (conscious) sedation was personally administered by an  ?     anesthesia professional. The following parameters were monitored: oxygen  ?     saturation, heart rate, blood pressure, respiratory rate, EKG, adequacy  ?     of pulmonary ventilation, and response to care. ?Recommendation:           - Patient has a contact number available for  ?                          emergencies. The signs and symptoms of potential  ?                          delayed complications were  discussed with the  ?                          patient. Return to normal activities tomorrow.  ?                          Written discharge instructions were provided to the  ?                          patient. ?                          - Resume previous diet. ?                          - Continue present medications. ?                          - Return to my office in 3 months. ?Procedure Code(s):        --- Professional --- ?                          [phone], Esophagogastroduodenoscopy, flexible,  ?                          transoral; diagnostic, including collection of  ?                          specimen(s) by brushing or washing, when performed  ?                          (separate procedure) ?                          43450, Dilation of esophagus, by unguided sound or  ?                          bougie, single or multiple passes ?Diagnosis Code(s):        --- Professional --- ?  K44.9, Diaphragmatic hernia without obstruction or  ?                          gangrene ?                          K22.2, Esophageal obstruction ?                          R13.10, Dysphagia, unspecified ?CPT copyright 2019 American Medical Association. All rights reserved. ?The codes documented in this report are preliminary and upon coder review may  ?be revised to meet current compliance requirements. ?[name]. Stephanieann Popescu, MD ?Gennette Pac, MD ?04/05/2021 11:42:56 AM ?This report has been signed electronically. ?Number of Addenda: 0 ?

## 2021-04-05 NOTE — Transfer of Care (Signed)
Immediate Anesthesia Transfer of Care Note ? ?Patient: Erica Gallegos ? ?Procedure(s) Performed: ESOPHAGOGASTRODUODENOSCOPY (EGD) WITH PROPOFOL ?MALONEY DILATION ? ?Patient Location: Short Stay ? ?Anesthesia Type:General ? ?Level of Consciousness: awake and patient cooperative ? ?Airway & Oxygen Therapy: Patient Spontanous Breathing ? ?Post-op Assessment: Report given to RN and Post -op Vital signs reviewed and stable ? ?Post vital signs: Reviewed and stable ? ?Last Vitals:  ?Vitals Value Taken Time  ?BP 102/58 04/05/21 1055  ?Temp 36.4 ?C 04/05/21 1055  ?Pulse 53 04/05/21 1055  ?Resp 15 04/05/21 1055  ?SpO2 96 % 04/05/21 1055  ? ? ?Last Pain:  ?Vitals:  ? 04/05/21 1055  ?TempSrc: Axillary  ?PainSc: 0-No pain  ?   ? ?Patients Stated Pain Goal: 7 (04/05/21 0920) ? ?Complications: No notable events documented. ?

## 2021-04-05 NOTE — Anesthesia Preprocedure Evaluation (Signed)
Anesthesia Evaluation  ?Patient identified by MRN, date of birth, ID band ?Patient awake ? ? ? ?Reviewed: ?Allergy & Precautions, H&P , NPO status , Patient's Chart, lab work & pertinent test results, reviewed documented beta blocker date and time  ? ?History of Anesthesia Complications ?(+) PONV and history of anesthetic complications ? ?Airway ?Mallampati: II ? ?TM Distance: >3 FB ?Neck ROM: full ? ? ? Dental ?no notable dental hx. ? ?  ?Pulmonary ?sleep apnea , former smoker,  ?  ?Pulmonary exam normal ?breath sounds clear to auscultation ? ? ? ? ? ? Cardiovascular ?Exercise Tolerance: Good ?hypertension, + Peripheral Vascular Disease  ? ?Rhythm:regular Rate:Normal ? ? ?  ?Neuro/Psych ? Headaches, PSYCHIATRIC DISORDERS Anxiety Depression   ? GI/Hepatic ?Neg liver ROS, GERD  Medicated,  ?Endo/Other  ?negative endocrine ROS ? Renal/GU ?negative Renal ROS  ?negative genitourinary ?  ?Musculoskeletal ? ? Abdominal ?  ?Peds ? Hematology ?negative hematology ROS ?(+)   ?Anesthesia Other Findings ? ? Reproductive/Obstetrics ?negative OB ROS ? ?  ? ? ? ? ? ? ? ? ? ? ? ? ? ?  ?  ? ? ? ? ? ? ? ? ?Anesthesia Physical ?Anesthesia Plan ? ?ASA: 2 ? ?Anesthesia Plan: General  ? ?Post-op Pain Management:   ? ?Induction:  ? ?PONV Risk Score and Plan: Propofol infusion ? ?Airway Management Planned:  ? ?Additional Equipment:  ? ?Intra-op Plan:  ? ?Post-operative Plan:  ? ?Informed Consent: I have reviewed the patients History and Physical, chart, labs and discussed the procedure including the risks, benefits and alternatives for the proposed anesthesia with the patient or authorized representative who has indicated his/her understanding and acceptance.  ? ? ? ?Dental Advisory Given ? ?Plan Discussed with: CRNA ? ?Anesthesia Plan Comments:   ? ? ? ? ? ? ?Anesthesia Quick Evaluation ? ?

## 2021-04-06 DIAGNOSIS — F4310 Post-traumatic stress disorder, unspecified: Secondary | ICD-10-CM | POA: Diagnosis not present

## 2021-04-10 ENCOUNTER — Encounter (HOSPITAL_COMMUNITY): Payer: Self-pay | Admitting: Internal Medicine

## 2021-04-25 ENCOUNTER — Ambulatory Visit (HOSPITAL_COMMUNITY)
Admission: RE | Admit: 2021-04-25 | Discharge: 2021-04-25 | Disposition: A | Discharge status: Home / Self Care | Payer: BC Managed Care – PPO | Source: Ambulatory Visit | Attending: Neurological Surgery | Admitting: Neurological Surgery

## 2021-04-25 DIAGNOSIS — R200 Anesthesia of skin: Secondary | ICD-10-CM | POA: Diagnosis not present

## 2021-04-25 DIAGNOSIS — G8929 Other chronic pain: Secondary | ICD-10-CM | POA: Insufficient documentation

## 2021-04-25 DIAGNOSIS — M545 Low back pain, unspecified: Secondary | ICD-10-CM | POA: Insufficient documentation

## 2021-04-25 DIAGNOSIS — R202 Paresthesia of skin: Secondary | ICD-10-CM | POA: Diagnosis not present

## 2021-04-25 DIAGNOSIS — M4802 Spinal stenosis, cervical region: Secondary | ICD-10-CM | POA: Diagnosis not present

## 2021-04-25 DIAGNOSIS — M50322 Other cervical disc degeneration at C5-C6 level: Secondary | ICD-10-CM | POA: Diagnosis not present

## 2021-05-02 DIAGNOSIS — Z6828 Body mass index (BMI) 28.0-28.9, adult: Secondary | ICD-10-CM | POA: Diagnosis not present

## 2021-05-02 DIAGNOSIS — M4127 Other idiopathic scoliosis, lumbosacral region: Secondary | ICD-10-CM | POA: Diagnosis not present

## 2021-05-02 DIAGNOSIS — M48062 Spinal stenosis, lumbar region with neurogenic claudication: Secondary | ICD-10-CM | POA: Diagnosis not present

## 2021-05-09 ENCOUNTER — Other Ambulatory Visit: Payer: Self-pay | Admitting: Internal Medicine

## 2021-05-21 DIAGNOSIS — F4310 Post-traumatic stress disorder, unspecified: Secondary | ICD-10-CM | POA: Diagnosis not present

## 2021-05-22 DIAGNOSIS — M48062 Spinal stenosis, lumbar region with neurogenic claudication: Secondary | ICD-10-CM | POA: Diagnosis not present

## 2021-05-29 ENCOUNTER — Encounter: Payer: Self-pay | Admitting: Internal Medicine

## 2021-05-30 DIAGNOSIS — F319 Bipolar disorder, unspecified: Secondary | ICD-10-CM | POA: Diagnosis not present

## 2021-06-27 ENCOUNTER — Other Ambulatory Visit (HOSPITAL_COMMUNITY): Payer: Self-pay | Admitting: Adult Health Nurse Practitioner

## 2021-06-27 ENCOUNTER — Other Ambulatory Visit (HOSPITAL_COMMUNITY): Payer: Self-pay | Admitting: Neurological Surgery

## 2021-06-27 ENCOUNTER — Other Ambulatory Visit (HOSPITAL_COMMUNITY): Payer: Self-pay | Admitting: Family Medicine

## 2021-06-27 ENCOUNTER — Other Ambulatory Visit (HOSPITAL_COMMUNITY): Payer: Self-pay | Admitting: Nurse Practitioner

## 2021-06-27 DIAGNOSIS — Z6827 Body mass index (BMI) 27.0-27.9, adult: Secondary | ICD-10-CM | POA: Diagnosis not present

## 2021-06-27 DIAGNOSIS — M48062 Spinal stenosis, lumbar region with neurogenic claudication: Secondary | ICD-10-CM | POA: Diagnosis not present

## 2021-06-27 DIAGNOSIS — Z1231 Encounter for screening mammogram for malignant neoplasm of breast: Secondary | ICD-10-CM

## 2021-06-27 DIAGNOSIS — M8580 Other specified disorders of bone density and structure, unspecified site: Secondary | ICD-10-CM

## 2021-06-27 DIAGNOSIS — M4150 Other secondary scoliosis, site unspecified: Secondary | ICD-10-CM | POA: Diagnosis not present

## 2021-06-29 ENCOUNTER — Other Ambulatory Visit: Payer: Self-pay | Admitting: Nurse Practitioner

## 2021-07-02 ENCOUNTER — Ambulatory Visit (HOSPITAL_COMMUNITY)
Admission: RE | Admit: 2021-07-02 | Discharge: 2021-07-02 | Disposition: A | Discharge status: Home / Self Care | Payer: BC Managed Care – PPO | Source: Ambulatory Visit | Attending: Neurological Surgery | Admitting: Neurological Surgery

## 2021-07-02 DIAGNOSIS — M8580 Other specified disorders of bone density and structure, unspecified site: Secondary | ICD-10-CM | POA: Diagnosis not present

## 2021-07-02 DIAGNOSIS — M8589 Other specified disorders of bone density and structure, multiple sites: Secondary | ICD-10-CM | POA: Diagnosis not present

## 2021-07-02 DIAGNOSIS — Z780 Asymptomatic menopausal state: Secondary | ICD-10-CM | POA: Diagnosis not present

## 2021-07-03 ENCOUNTER — Other Ambulatory Visit: Payer: Self-pay | Admitting: Nurse Practitioner

## 2021-07-03 DIAGNOSIS — M48062 Spinal stenosis, lumbar region with neurogenic claudication: Secondary | ICD-10-CM | POA: Diagnosis not present

## 2021-07-03 MED ORDER — EZETIMIBE 10 MG PO TABS
10.0000 mg | ORAL_TABLET | Freq: Every day | ORAL | 1 refills | Status: DC
Start: 2021-07-03 — End: 2021-10-22

## 2021-07-03 MED ORDER — LISINOPRIL 40 MG PO TABS
40.0000 mg | ORAL_TABLET | Freq: Every day | ORAL | 1 refills | Status: DC
Start: 2021-07-03 — End: 2021-10-22

## 2021-07-03 MED ORDER — LAMOTRIGINE 25 MG PO TABS
50.0000 mg | ORAL_TABLET | Freq: Every day | ORAL | 3 refills | Status: DC
Start: 2021-07-03 — End: 2022-12-26

## 2021-07-03 NOTE — Telephone Encounter (Signed)
Please advise 

## 2021-07-04 NOTE — Telephone Encounter (Signed)
Called pt no answer left vm 

## 2021-07-05 ENCOUNTER — Telehealth: Payer: Self-pay | Admitting: Nurse Practitioner

## 2021-07-05 ENCOUNTER — Other Ambulatory Visit: Payer: Self-pay | Admitting: Nurse Practitioner

## 2021-07-05 DIAGNOSIS — F4310 Post-traumatic stress disorder, unspecified: Secondary | ICD-10-CM | POA: Diagnosis not present

## 2021-07-05 MED ORDER — HYDRALAZINE HCL 100 MG PO TABS: 100.0000 mg | ORAL_TABLET | Freq: Two times a day (BID) | ORAL | 1 refills | Status: DC

## 2021-07-05 MED ORDER — CHLORTHALIDONE 25 MG PO TABS
ORAL_TABLET | ORAL | 3 refills | Status: DC
Start: 2021-07-05 — End: 2021-10-22

## 2021-07-05 MED ORDER — CYCLOBENZAPRINE HCL 10 MG PO TABS: ORAL_TABLET | ORAL | 0 refills | Status: DC

## 2021-07-05 NOTE — Telephone Encounter (Signed)
Pt aware.

## 2021-07-05 NOTE — Telephone Encounter (Signed)
Please advise on flexeril and hydralazine refills

## 2021-07-05 NOTE — Telephone Encounter (Signed)
Pt called wanting refills on 4 medications. Wants to know if you can please refill?    chlorthalidone (HYGROTON) 25 MG tablet   cyclobenzaprine (FLEXERIL) 10 MG tabletcyclobenzaprine (FLEXERIL) 10 MG tablet  ezetimibe (ZETIA) 10 MG tablet  hydrALAZINE (APRESOLINE) 100 MG tablet      [facility] [address]

## 2021-07-06 NOTE — Telephone Encounter (Signed)
Called pt to let her know prescriptions have been sent no answer left vm

## 2021-07-18 DIAGNOSIS — F331 Major depressive disorder, recurrent, moderate: Secondary | ICD-10-CM | POA: Diagnosis not present

## 2021-07-24 ENCOUNTER — Ambulatory Visit (INDEPENDENT_AMBULATORY_CARE_PROVIDER_SITE_OTHER): Payer: BC Managed Care – PPO | Admitting: Nurse Practitioner

## 2021-07-24 ENCOUNTER — Encounter: Payer: Self-pay | Admitting: Nurse Practitioner

## 2021-07-24 VITALS — BP 150/80 | HR 68 | Ht 66.0 in | Wt 166.8 lb

## 2021-07-24 DIAGNOSIS — I159 Secondary hypertension, unspecified: Secondary | ICD-10-CM

## 2021-07-24 DIAGNOSIS — E782 Mixed hyperlipidemia: Secondary | ICD-10-CM | POA: Diagnosis not present

## 2021-07-24 DIAGNOSIS — Z1211 Encounter for screening for malignant neoplasm of colon: Secondary | ICD-10-CM | POA: Diagnosis not present

## 2021-07-24 DIAGNOSIS — E785 Hyperlipidemia, unspecified: Secondary | ICD-10-CM

## 2021-07-24 DIAGNOSIS — G629 Polyneuropathy, unspecified: Secondary | ICD-10-CM

## 2021-07-24 DIAGNOSIS — M85852 Other specified disorders of bone density and structure, left thigh: Secondary | ICD-10-CM | POA: Diagnosis not present

## 2021-07-24 DIAGNOSIS — M810 Age-related osteoporosis without current pathological fracture: Secondary | ICD-10-CM | POA: Insufficient documentation

## 2021-07-24 DIAGNOSIS — E559 Vitamin D deficiency, unspecified: Secondary | ICD-10-CM

## 2021-07-24 DIAGNOSIS — W19XXXS Unspecified fall, sequela: Secondary | ICD-10-CM

## 2021-07-24 DIAGNOSIS — Z23 Encounter for immunization: Secondary | ICD-10-CM | POA: Diagnosis not present

## 2021-07-24 MED ORDER — CALCIUM CARB-CHOLECALCIFEROL 600-20 MG-MCG PO TABS: 600.0000 mg | ORAL_TABLET | Freq: Two times a day (BID) | ORAL | 3 refills | Status: DC

## 2021-07-24 MED ORDER — HYDRALAZINE HCL 100 MG PO TABS: 100.0000 mg | ORAL_TABLET | Freq: Three times a day (TID) | ORAL | 1 refills | Status: DC

## 2021-07-24 MED ORDER — ATORVASTATIN CALCIUM 40 MG PO TABS: 40.0000 mg | ORAL_TABLET | Freq: Every day | ORAL | 3 refills | Status: DC

## 2021-07-24 MED ORDER — GABAPENTIN 300 MG PO CAPS
ORAL_CAPSULE | ORAL | 2 refills | Status: DC
Start: 2021-07-24 — End: 2022-01-17

## 2021-07-24 NOTE — Addendum Note (Signed)
Addended by: Herbie Saxon on: 07/24/2021 11:20 AM   Modules accepted: Orders

## 2021-07-24 NOTE — Assessment & Plan Note (Signed)
On vitamin D 2000 units daily Check vitamin D levels 

## 2021-07-24 NOTE — Assessment & Plan Note (Addendum)
Has spinal stenosis followed by spine specialist Patient encouraged to continue to use a cane for stability need to keep walking area clean and providing adequate lighting to prevent falls discussed. Would like to refer patient to PT but patient declined PT referral today.  She stated that her spine specialist told her that physical therapy would not benefit her. Patient encouraged to maintain close follow-up with spine specialist

## 2021-07-24 NOTE — Assessment & Plan Note (Signed)
Currently on atorvastatin 40 mg daily, Zetia 10 mg daily, omega-3 fatty acid 4000 mg daily Check lipid panel Avoid fried fatty foods

## 2021-07-24 NOTE — Assessment & Plan Note (Signed)
Start Caltrate 600 mg with D3 twice daily Need to prevent falls discussed

## 2021-07-24 NOTE — Patient Instructions (Addendum)
Nurse please  give TDAP and shingles vaccine.   Please star taking hydralazine 100 mg three times daily for high blood pressure   Start taking calTARTE 600 +D3 for osteopenia   It is important that you exercise regularly at least 30 minutes 5 times a week as tolerated   Think about what you will eat, plan ahead. Choose " clean, green, fresh or frozen" over canned, processed or packaged foods which are more sugary, salty and fatty. 70 to 75% of food eaten should be vegetables and fruit. Three meals at set times with snacks allowed between meals, but they must be fruit or vegetables. Aim to eat over a 12 hour period , example 7 am to 7 pm, and STOP after  your last meal of the day. Drink water,generally about 64 ounces per day, no other drink is as healthy. Fruit juice is best enjoyed in a healthy way, by EATING the fruit.  Thanks for choosing [facility], we consider it a privelige to serve you.

## 2021-07-24 NOTE — Assessment & Plan Note (Signed)
BP Readings from Last 3 Encounters:  07/24/21 (!) 150/80  04/05/21 (!) 102/58  04/04/21 136/82  Has resistant hypertension Currently on spironolactone 25 mg daily, chlorthalidone 37.5 mg daily, lisinopril 40 mg daily, hydralazine 100 mg twice daily Start hydralazine 100 mg 3 times daily, Monitor blood pressure daily at home keep a log and bring to next follow-up appointment in 4 weeks DASH diet advised engage in walking exercises as tolerated

## 2021-07-24 NOTE — Assessment & Plan Note (Signed)
Chronic condition on gabapentin Medication refilled today

## 2021-07-24 NOTE — Progress Notes (Signed)
   JOLEE CRITCHER     MRN: [id number]      DOB: Sep 14, 1955   HPI Ms. Embree with past medical history of osteopenia, resistant hypertension, hyperlipidemia, neuropathy, spinal stenosis is here for follow up and re-evaluation of chronic medical conditions, medication management    Spinal stenosis/fall .  Patient stated that she has had 3 falls in the past month, fell on her right side yesterday just walking through her living  room , has chronic left hip pain , takes gabapentin , flexeril as ordered. Goes to [place] [facility], gets spinal injection, denies passing out , hitting her head on a hard surface.   Hyperlipidemia .  Currently taking atorvastatin 40mg  daily , zetia 10mg  daily   Due for shingles and Tdap vaccines need for both vaccines discussed with patient vaccines given today.   Referral sent to GI for screening colonoscopy.      ROS Denies recent fever or chills. Denies sinus pressure, nasal congestion, ear pain or sore throat. Denies chest congestion, productive cough or wheezing. Denies chest pains, palpitations and leg swelling Denies abdominal pain, nausea, vomiting,diarrhea or constipation.   Denies dysuria, frequency, hesitancy or incontinence. Denies headaches, seizures, numbness, or tingling. Denies depression, anxiety or insomnia.    PE  BP (!) 150/80   Pulse 68   Ht 5\' 6"  (1.676 m)   Wt 166 lb 12.8 oz (75.7 kg)   SpO2 98%   BMI 26.92 kg/m   Patient alert and oriented and in no cardiopulmonary distress.  Chest: Clear to auscultation bilaterally.  CVS: S1, S2 no murmurs, no S3.Regular rate.  ABD: Soft non tender.   Ext: No edema  MS: decreased  ROM spine, shoulders, hips and knees uses a cane . Steady gait with a cane   Psych: Good eye contact, normal affect. Memory intact not anxious or depressed appearing.  CNS: CN 2-12 intact, power,  normal throughout.no focal deficits noted.   Assessment & Plan Hypertension BP  Readings from Last 3 Encounters:  07/24/21 (!) 150/80  04/05/21 (!) 102/58  04/04/21 136/82  Has resistant hypertension Currently on spironolactone 25 mg daily, chlorthalidone 37.5 mg daily, lisinopril 40 mg daily, hydralazine 100 mg twice daily Start hydralazine 100 mg 3 times daily, Monitor blood pressure daily at home keep a log and bring to next follow-up appointment in 4 weeks DASH diet advised engage in walking exercises as tolerated  Osteopenia of neck of left femur Start Caltrate 600 mg with D3 twice daily Need to prevent falls discussed  Neuropathy Chronic condition on gabapentin Medication refilled today  Hyperlipidemia Currently on atorvastatin 40 mg daily, Zetia 10 mg daily, omega-3 fatty acid 4000 mg daily Check lipid panel Avoid fried fatty foods  Fall Has spinal stenosis followed by spine specialist Patient encouraged to continue to use a cane for stability need to keep walking area clean and providing adequate lighting to prevent falls discussed. Would like to refer patient to PT but patient declined PT referral today.  She stated that her spine specialist told her that physical therapy would not benefit her. Patient encouraged to maintain close follow-up with spine specialist  Vitamin D deficiency On vitamin D 2000 units daily Check vitamin D levels

## 2021-07-25 ENCOUNTER — Encounter: Payer: Self-pay | Admitting: *Deleted

## 2021-08-10 ENCOUNTER — Telehealth: Payer: Self-pay

## 2021-08-10 ENCOUNTER — Other Ambulatory Visit: Payer: Self-pay | Admitting: Nurse Practitioner

## 2021-08-10 ENCOUNTER — Other Ambulatory Visit: Payer: Self-pay

## 2021-08-10 DIAGNOSIS — K219 Gastro-esophageal reflux disease without esophagitis: Secondary | ICD-10-CM | POA: Diagnosis not present

## 2021-08-10 DIAGNOSIS — E782 Mixed hyperlipidemia: Secondary | ICD-10-CM

## 2021-08-10 DIAGNOSIS — E559 Vitamin D deficiency, unspecified: Secondary | ICD-10-CM | POA: Diagnosis not present

## 2021-08-10 DIAGNOSIS — E785 Hyperlipidemia, unspecified: Secondary | ICD-10-CM | POA: Diagnosis not present

## 2021-08-10 DIAGNOSIS — I159 Secondary hypertension, unspecified: Secondary | ICD-10-CM

## 2021-08-10 NOTE — Telephone Encounter (Signed)
Please advise 

## 2021-08-10 NOTE — Telephone Encounter (Signed)
Orders sent

## 2021-08-10 NOTE — Telephone Encounter (Signed)
Patient need med refill  cyclobenzaprine (FLEXERIL) 10 MG tablet  Pharmacy  [facility] [phone] - [place], [place] - [address]  [address], [place] [address]  Phone:  [phone]  Fax:  [phone]

## 2021-08-10 NOTE — Telephone Encounter (Signed)
Patient needs labs sent to [facility] insurance will not cover [facility].

## 2021-08-11 ENCOUNTER — Other Ambulatory Visit: Payer: Self-pay | Admitting: Nurse Practitioner

## 2021-08-11 DIAGNOSIS — M85852 Other specified disorders of bone density and structure, left thigh: Secondary | ICD-10-CM

## 2021-08-11 LAB — HEPATIC FUNCTION PANEL
AG Ratio: 1.7 (calc) (ref 1.0–2.5)
ALT: 21 U/L (ref 6–29)
AST: 14 U/L (ref 10–35)
Albumin: 4.5 g/dL (ref 3.6–5.1)
Alkaline phosphatase (APISO): 68 U/L (ref 37–153)
Bilirubin, Direct: 0.1 mg/dL (ref 0.0–0.2)
Globulin: 2.7 g/dL (calc) (ref 1.9–3.7)
Indirect Bilirubin: 0.4 mg/dL (calc) (ref 0.2–1.2)
Total Bilirubin: 0.5 mg/dL (ref 0.2–1.2)
Total Protein: 7.2 g/dL (ref 6.1–8.1)

## 2021-08-11 LAB — COMPLETE METABOLIC PANEL WITH GFR
AG Ratio: 1.7 (calc) (ref 1.0–2.5)
ALT: 21 U/L (ref 6–29)
AST: 14 U/L (ref 10–35)
Albumin: 4.5 g/dL (ref 3.6–5.1)
Alkaline phosphatase (APISO): 68 U/L (ref 37–153)
BUN/Creatinine Ratio: 20 (calc) (ref 6–22)
BUN: 26 mg/dL — ABNORMAL HIGH (ref 7–25)
CO2: 28 mmol/L (ref 20–32)
Calcium: 10.6 mg/dL — ABNORMAL HIGH (ref 8.6–10.4)
Chloride: 105 mmol/L (ref 98–110)
Creat: 1.28 mg/dL — ABNORMAL HIGH (ref 0.50–1.05)
Globulin: 2.7 g/dL (calc) (ref 1.9–3.7)
Glucose, Bld: 84 mg/dL (ref 65–99)
Potassium: 5.3 mmol/L (ref 3.5–5.3)
Sodium: 140 mmol/L (ref 135–146)
Total Bilirubin: 0.5 mg/dL (ref 0.2–1.2)
Total Protein: 7.2 g/dL (ref 6.1–8.1)
eGFR: 47 mL/min/{1.73_m2} — ABNORMAL LOW (ref >=60)

## 2021-08-11 LAB — LIPID PANEL
Cholesterol: 136 mg/dL (ref <200)
HDL: 62 mg/dL (ref >=50)
LDL Cholesterol (Calc): 56 mg/dL (calc)
Non-HDL Cholesterol (Calc): 74 mg/dL (calc) (ref <130)
Total CHOL/HDL Ratio: 2.2 (calc) (ref <5.0)
Triglycerides: 92 mg/dL (ref <150)

## 2021-08-11 LAB — VITAMIN D 25 HYDROXY (VIT D DEFICIENCY, FRACTURES): Vit D, 25-Hydroxy: 36 ng/mL (ref 30–100)

## 2021-08-11 MED ORDER — CALCIUM CARB-CHOLECALCIFEROL 600-20 MG-MCG PO TABS: 600.0000 mg | ORAL_TABLET | Freq: Every day | ORAL | 3 refills | Status: DC

## 2021-08-11 NOTE — Progress Notes (Signed)
Hyper calcemia, patient should take Caltrate+D3, one tablet daily instead of twice daily . Other labs are stable

## 2021-08-14 DIAGNOSIS — F4310 Post-traumatic stress disorder, unspecified: Secondary | ICD-10-CM | POA: Diagnosis not present

## 2021-08-20 ENCOUNTER — Telehealth: Payer: Self-pay | Admitting: *Deleted

## 2021-08-20 NOTE — Telephone Encounter (Signed)
Received colonoscopy questionnaire in mail from patient. Pt saw Dr. Jena Gauss on 03/16/21 and reported "Repeat colonoscopy in 5 years for screening purposes". Pt also has a recall already in chart for this.

## 2021-08-21 ENCOUNTER — Encounter: Payer: Self-pay | Admitting: Nurse Practitioner

## 2021-08-21 ENCOUNTER — Ambulatory Visit (INDEPENDENT_AMBULATORY_CARE_PROVIDER_SITE_OTHER): Payer: BC Managed Care – PPO | Admitting: Nurse Practitioner

## 2021-08-21 VITALS — BP 135/76 | HR 87 | Ht 66.0 in | Wt 163.0 lb

## 2021-08-21 DIAGNOSIS — I10 Essential (primary) hypertension: Secondary | ICD-10-CM

## 2021-08-21 DIAGNOSIS — R413 Other amnesia: Secondary | ICD-10-CM | POA: Diagnosis not present

## 2021-08-21 DIAGNOSIS — W19XXXS Unspecified fall, sequela: Secondary | ICD-10-CM | POA: Diagnosis not present

## 2021-08-21 NOTE — Assessment & Plan Note (Addendum)
Patient encouraged to continue to use  A cane  for support to help prevent fall she verbalized understanding

## 2021-08-21 NOTE — Assessment & Plan Note (Addendum)
BP Readings from Last 3 Encounters:  08/21/21 135/76  07/24/21 (!) 150/80  04/05/21 (!) 102/58  Chronic condition well-controlled Currently on carvedilol 12.5 mg twice daily, chlorthalidone 37.5 mg daily, hydralazine 100 mg 3 times daily, lisinopril 40 mg daily, spironolactone 25 mg daily.  Continue current medications DASH diet advised

## 2021-08-21 NOTE — Assessment & Plan Note (Addendum)
MMSE score 29 No concern for Alzheimer's dementia Will check B12 levels at next visit if symptoms persist

## 2021-08-21 NOTE — Patient Instructions (Signed)
Please consider using your walker to help prevent falls as dicussed   Your memory exam was very good, I have no concern for dementia    It is important that you exercise regularly at least 30 minutes 5 times a week as tolerated  Think about what you will eat, plan ahead. Choose " clean, green, fresh or frozen" over canned, processed or packaged foods which are more sugary, salty and fatty. 70 to 75% of food eaten should be vegetables and fruit. Three meals at set times with snacks allowed between meals, but they must be fruit or vegetables. Aim to eat over a 12 hour period , example 7 am to 7 pm, and STOP after  your last meal of the day. Drink water,generally about 64 ounces per day, no other drink is as healthy. Fruit juice is best enjoyed in a healthy way, by EATING the fruit.  Thanks for choosing [facility], we consider it a privelige to serve you.

## 2021-08-21 NOTE — Progress Notes (Signed)
   AMEILIA Gallegos     MRN: [id number]      DOB: [birth year]   HPI Ms. Erica Gallegos with past medical history of hypertension, GERD, neuropathy, vitamin D deficiency, osteopenia is here for follow up for hypertension  Hypertension.  Currently on carvedilol 12.5 mg twice daily, chlorthalidone 37.5 mg daily, hydralazine 100 mg 3 times daily, lisinopril 40 mg daily, spironolactone 25 mg daily.  Patient denies edema, chest pain, dizziness   Patient complains of getting things mixed up , forgetting things , she has noticed this since the last few months, memory issues becoming more problematic. Has no known family history of alzihmers, dementia . Her husnabnd has noticed her forgetfullness as well, she denies confusion   Has recived paper work for screening colonoscopy          ROS Denies recent fever or chills. Denies sinus pressure, nasal congestion, ear pain or sore throat. Denies chest congestion, productive cough or wheezing. Denies chest pains, palpitations and leg swelling Denies abdominal pain, nausea, vomiting,diarrhea or constipation.   Denies dysuria, frequency, hesitancy or incontinence. Denies headaches, seizures, numbness, or tingling. Denies depression, anxiety or insomnia. Denies skin break down or rash.   PE  BP 135/76 (BP Location: Right Arm, Patient Position: Sitting, Cuff Size: Normal)   Pulse 87   Ht 5\' 6"  (1.676 m)   Wt 163 lb (73.9 kg)   SpO2 97%   BMI 26.31 kg/m   Patient alert and oriented and in no cardiopulmonary distress.  Chest: Clear to auscultation bilaterally.  CVS: S1, S2 no murmurs, no S3.Regular rate.  ABD: Soft non tender.   Ext: No edema  MS: decreased ROM spine, shoulders, hips and knees.  Skin: Intact, no ulcerations or rash noted.  Psych: Good eye contact, normal affect. Memory intact not anxious or depressed appearing.  CNS: CN 2-12 intact, power,  normal throughout.no focal deficits noted.   Assessment &  Plan  Hypertension BP Readings from Last 3 Encounters:  08/21/21 135/76  07/24/21 (!) 150/80  04/05/21 (!) 102/58  Chronic condition well-controlled Currently on carvedilol 12.5 mg twice daily, chlorthalidone 37.5 mg daily, hydralazine 100 mg 3 times daily, lisinopril 40 mg daily, spironolactone 25 mg daily.  Continue current medications DASH diet advised  Memory problem MMSE score 29 No concern for Alzheimer's dementia Will check B12 levels at next visit if symptoms persist  Fall Patient encouraged to continue to use  A cane  for support to help prevent fall she verbalized understanding

## 2021-08-21 NOTE — Telephone Encounter (Signed)
Noted on referral.

## 2021-08-27 DIAGNOSIS — F4310 Post-traumatic stress disorder, unspecified: Secondary | ICD-10-CM | POA: Diagnosis not present

## 2021-08-31 DIAGNOSIS — H5005 Alternating esotropia: Secondary | ICD-10-CM | POA: Diagnosis not present

## 2021-09-11 ENCOUNTER — Other Ambulatory Visit: Payer: Self-pay

## 2021-09-11 DIAGNOSIS — F4310 Post-traumatic stress disorder, unspecified: Secondary | ICD-10-CM | POA: Diagnosis not present

## 2021-09-11 DIAGNOSIS — R7989 Other specified abnormal findings of blood chemistry: Secondary | ICD-10-CM

## 2021-09-11 DIAGNOSIS — Z1159 Encounter for screening for other viral diseases: Secondary | ICD-10-CM

## 2021-09-12 ENCOUNTER — Ambulatory Visit (HOSPITAL_COMMUNITY): Payer: BC Managed Care – PPO

## 2021-09-13 ENCOUNTER — Other Ambulatory Visit: Payer: Self-pay | Admitting: *Deleted

## 2021-09-13 ENCOUNTER — Ambulatory Visit (HOSPITAL_COMMUNITY)
Admission: RE | Admit: 2021-09-13 | Discharge: 2021-09-13 | Disposition: A | Discharge status: Home / Self Care | Payer: BC Managed Care – PPO | Source: Ambulatory Visit | Attending: Nurse Practitioner | Admitting: Nurse Practitioner

## 2021-09-13 DIAGNOSIS — Z1231 Encounter for screening mammogram for malignant neoplasm of breast: Secondary | ICD-10-CM | POA: Diagnosis not present

## 2021-09-13 MED ORDER — CARVEDILOL 12.5 MG PO TABS: 12.5000 mg | ORAL_TABLET | Freq: Two times a day (BID) | ORAL | 0 refills | Status: DC

## 2021-09-14 NOTE — Progress Notes (Signed)
Normal mammogram

## 2021-09-21 ENCOUNTER — Other Ambulatory Visit: Payer: Self-pay | Admitting: Nurse Practitioner

## 2021-09-21 MED ORDER — CYCLOBENZAPRINE HCL 10 MG PO TABS: ORAL_TABLET | ORAL | 0 refills | Status: DC

## 2021-09-21 NOTE — Telephone Encounter (Signed)
Please advise 

## 2021-09-25 DIAGNOSIS — M48062 Spinal stenosis, lumbar region with neurogenic claudication: Secondary | ICD-10-CM | POA: Diagnosis not present

## 2021-09-28 DIAGNOSIS — F331 Major depressive disorder, recurrent, moderate: Secondary | ICD-10-CM | POA: Diagnosis not present

## 2021-09-28 DIAGNOSIS — F4310 Post-traumatic stress disorder, unspecified: Secondary | ICD-10-CM | POA: Diagnosis not present

## 2021-09-28 DIAGNOSIS — F319 Bipolar disorder, unspecified: Secondary | ICD-10-CM | POA: Diagnosis not present

## 2021-10-01 DIAGNOSIS — F4310 Post-traumatic stress disorder, unspecified: Secondary | ICD-10-CM | POA: Diagnosis not present

## 2021-10-03 DIAGNOSIS — R7989 Other specified abnormal findings of blood chemistry: Secondary | ICD-10-CM | POA: Diagnosis not present

## 2021-10-04 LAB — HEPATIC FUNCTION PANEL
AG Ratio: 1.7 (calc) (ref 1.0–2.5)
ALT: 27 U/L (ref 6–29)
AST: 17 U/L (ref 10–35)
Albumin: 4.3 g/dL (ref 3.6–5.1)
Alkaline phosphatase (APISO): 55 U/L (ref 37–153)
Bilirubin, Direct: 0.2 mg/dL (ref 0.0–0.2)
Globulin: 2.5 g/dL (calc) (ref 1.9–3.7)
Indirect Bilirubin: 0.5 mg/dL (calc) (ref 0.2–1.2)
Total Bilirubin: 0.7 mg/dL (ref 0.2–1.2)
Total Protein: 6.8 g/dL (ref 6.1–8.1)

## 2021-10-08 ENCOUNTER — Other Ambulatory Visit: Payer: Self-pay

## 2021-10-08 DIAGNOSIS — R7989 Other specified abnormal findings of blood chemistry: Secondary | ICD-10-CM

## 2021-10-12 DIAGNOSIS — F4310 Post-traumatic stress disorder, unspecified: Secondary | ICD-10-CM | POA: Diagnosis not present

## 2021-10-17 ENCOUNTER — Other Ambulatory Visit: Payer: Self-pay | Admitting: Nurse Practitioner

## 2021-10-17 ENCOUNTER — Other Ambulatory Visit: Payer: Self-pay

## 2021-10-17 ENCOUNTER — Telehealth: Payer: Self-pay | Admitting: Internal Medicine

## 2021-10-17 MED ORDER — CYCLOBENZAPRINE HCL 10 MG PO TABS: ORAL_TABLET | ORAL | 0 refills | Status: DC

## 2021-10-17 NOTE — Telephone Encounter (Signed)
Pt called stating her med refill has been denied. States when she received the last refill it was on for 15 days & she normally gets 30days. Can you please refill?  cyclobenzaprine (FLEXERIL) 10 MG tablet   [facility] [address]   Please contact patient with any questions on her condition requiring this medication.

## 2021-10-18 DIAGNOSIS — G4733 Obstructive sleep apnea (adult) (pediatric): Secondary | ICD-10-CM | POA: Diagnosis not present

## 2021-10-19 ENCOUNTER — Other Ambulatory Visit: Payer: Self-pay | Admitting: Cardiology

## 2021-10-19 ENCOUNTER — Other Ambulatory Visit: Payer: Self-pay | Admitting: Internal Medicine

## 2021-10-22 ENCOUNTER — Ambulatory Visit (INDEPENDENT_AMBULATORY_CARE_PROVIDER_SITE_OTHER): Payer: BC Managed Care – PPO | Admitting: Internal Medicine

## 2021-10-22 ENCOUNTER — Encounter: Payer: Self-pay | Admitting: Internal Medicine

## 2021-10-22 VITALS — BP 129/67 | HR 61 | Ht 66.0 in | Wt 157.8 lb

## 2021-10-22 DIAGNOSIS — R110 Nausea: Secondary | ICD-10-CM | POA: Diagnosis not present

## 2021-10-22 DIAGNOSIS — R413 Other amnesia: Secondary | ICD-10-CM | POA: Diagnosis not present

## 2021-10-22 DIAGNOSIS — E782 Mixed hyperlipidemia: Secondary | ICD-10-CM

## 2021-10-22 DIAGNOSIS — Z23 Encounter for immunization: Secondary | ICD-10-CM | POA: Diagnosis not present

## 2021-10-22 DIAGNOSIS — Z139 Encounter for screening, unspecified: Secondary | ICD-10-CM | POA: Diagnosis not present

## 2021-10-22 DIAGNOSIS — E559 Vitamin D deficiency, unspecified: Secondary | ICD-10-CM | POA: Diagnosis not present

## 2021-10-22 DIAGNOSIS — I10 Essential (primary) hypertension: Secondary | ICD-10-CM

## 2021-10-22 DIAGNOSIS — K219 Gastro-esophageal reflux disease without esophagitis: Secondary | ICD-10-CM

## 2021-10-22 DIAGNOSIS — R1310 Dysphagia, unspecified: Secondary | ICD-10-CM

## 2021-10-22 DIAGNOSIS — W19XXXS Unspecified fall, sequela: Secondary | ICD-10-CM

## 2021-10-22 MED ORDER — LISINOPRIL 40 MG PO TABS: 40.0000 mg | ORAL_TABLET | Freq: Every day | ORAL | 1 refills | Status: DC

## 2021-10-22 MED ORDER — EZETIMIBE 10 MG PO TABS: 10.0000 mg | ORAL_TABLET | Freq: Every day | ORAL | 1 refills | Status: DC

## 2021-10-22 MED ORDER — SPIRONOLACTONE 25 MG PO TABS: ORAL_TABLET | ORAL | 1 refills | Status: DC

## 2021-10-22 MED ORDER — CARVEDILOL 12.5 MG PO TABS: ORAL_TABLET | ORAL | 0 refills | Status: DC

## 2021-10-22 MED ORDER — CHLORTHALIDONE 25 MG PO TABS: ORAL_TABLET | ORAL | 3 refills | Status: DC

## 2021-10-22 MED ORDER — LANSOPRAZOLE 30 MG PO CPDR: DELAYED_RELEASE_CAPSULE | ORAL | 3 refills | Status: DC

## 2021-10-22 MED ORDER — HYDRALAZINE HCL 100 MG PO TABS: 100.0000 mg | ORAL_TABLET | Freq: Two times a day (BID) | ORAL | 0 refills | Status: DC

## 2021-10-22 NOTE — Patient Instructions (Signed)
It was a pleasure to see you today.  Thank you for giving [place] the opportunity to be involved in your care.  Below is a brief recap of your visit and next steps.  We will plan to see you again in February.  Summary I have refilled your medications. You will receive you flu and shingles vaccines. We will check labs today and I will notify you of results.

## 2021-10-22 NOTE — Progress Notes (Unsigned)
 Established Patient Office Visit  Subjective   Patient ID: Erica Gallegos, female    DOB: 03/20/1955  Age: 66 y.o. MRN: [id number]  Chief Complaint  Patient presents with   Medication Refill   Erica Gallegos returns to care today.  She is a 66-year-old woman with a past medical history significant for hypertension, GERD, neuropathy, osteopenia, vitamin D deficiency, depression/anxiety.  She was last seen at [facility] on 08/21/21 by Folashade Paseda, NP.  She expressed concern over memory issues at that time and recent falls.  An MMSE performed at that visit was not concerning for memory impairment.  In the interim she has completed her mammogram, which did not show any mammographic evidence of malignancy.  Today Erica Gallegos endorses ongoing depression that she is closely following with her psychiatrist to manage.  She is otherwise asymptomatic.  She presents today largely for medication refills and has no additional concerns to discuss.  Chronic medical conditions and outstanding preventative healthcare maintenance items discussed today are individually addressed in A/P below.  Past Medical History:  Diagnosis Date   Anxiety    Arthritis    Complication of anesthesia    Patient woke up during anesthesia with bunionectomy   GERD (gastroesophageal reflux disease)    HLD (hyperlipidemia)    HTN (hypertension)    Major depressive disorder    Migraine with aura    PONV (postoperative nausea and vomiting)    RAS (renal artery stenosis) (HCC)    Sleep apnea    Sleep related hypoxia    Spinal stenosis    Past Surgical History:  Procedure Laterality Date   BUNIONECTOMY     CATARACT EXTRACTION     CHOLECYSTECTOMY     ESOPHAGOGASTRODUODENOSCOPY (EGD) WITH PROPOFOL N/A 04/05/2021   Procedure: ESOPHAGOGASTRODUODENOSCOPY (EGD) WITH PROPOFOL;  Surgeon: Rourk, Robert M, MD;  [facility];  Service: Endoscopy;  Laterality: N/A;  10:15am   MALONEY DILATION N/A 04/05/2021   Procedure: MALONEY  DILATION;  Surgeon: Rourk, Robert M, MD;  [facility]: [facility];  Service: Endoscopy;  Laterality: N/A;   Social History   Tobacco Use   Smoking status: Former    Types: Cigarettes   Smokeless tobacco: Never   Tobacco comments:    She quit smoking in the early 90's  Substance Use Topics   Alcohol use: Yes    Comment: seldom   Drug use: Yes    Types: Marijuana    Comment: CBD cream; Marijuana a couple times a week. helps with her anxiety and migraines.   Family History  Problem Relation Age of Onset   Hypertension Mother    Diabetes Mother    Heart disease Mother    Stroke Father    Hypertension Father    Heart disease Father    Hypertension Sister    Lupus Sister    Hypertension Sister    Cancer Brother    Brain cancer Brother    Non-Hodgkin's lymphoma Brother    Colon cancer Neg Hx    Allergies  Allergen Reactions   Atenolol Swelling   Amlodipine Swelling   Review of Systems  Psychiatric/Behavioral:  Positive for depression.   All other systems reviewed and are negative.    Objective:     BP 129/67   Pulse 61   Ht 5' 6" (1.676 m)   Wt 157 lb 12.8 oz (71.6 kg)   SpO2 97%   BMI 25.47 kg/m   Physical Exam Constitutional:        General: She is not in acute distress.    Appearance: Normal appearance. She is not toxic-appearing.  HENT:     Head: Normocephalic and atraumatic.     Right Ear: External ear normal.     Left Ear: External ear normal.     Nose: Nose normal. No congestion or rhinorrhea.     Mouth/Throat:     Mouth: Mucous membranes are moist.     Pharynx: Oropharynx is clear. No oropharyngeal exudate or posterior oropharyngeal erythema.  Eyes:     General: No scleral icterus.    Extraocular Movements: Extraocular movements intact.     Conjunctiva/sclera: Conjunctivae normal.     Pupils: Pupils are equal, round, and reactive to light.  Cardiovascular:     Rate and Rhythm: Normal rate and regular rhythm.     Pulses: Normal pulses.     Heart  sounds: Normal heart sounds. No murmur heard.    No friction rub. No gallop.  Pulmonary:     Effort: Pulmonary effort is normal.     Breath sounds: Normal breath sounds. No wheezing, rhonchi or rales.  Abdominal:     General: Abdomen is flat. Bowel sounds are normal. There is no distension.     Palpations: Abdomen is soft.     Tenderness: There is no abdominal tenderness.  Musculoskeletal:        General: No swelling. Normal range of motion.     Cervical back: Normal range of motion.     Right lower leg: No edema.     Left lower leg: No edema.  Lymphadenopathy:     Cervical: No cervical adenopathy.  Skin:    General: Skin is warm and dry.     Capillary Refill: Capillary refill takes less than 2 seconds.     Coloration: Skin is not jaundiced.  Neurological:     General: No focal deficit present.     Mental Status: She is alert and oriented to person, place, and time.     Gait: Gait abnormal (Ambulates with walker).  Psychiatric:        Mood and Affect: Mood normal.        Behavior: Behavior normal.    Last CBC No results found for: "WBC", "HGB", "HCT", "MCV", "MCH", "RDW", "PLT" Last metabolic panel Lab Results  Component Value Date   GLUCOSE 84 08/10/2021   NA 140 08/10/2021   K 5.3 08/10/2021   CL 105 08/10/2021   CO2 28 08/10/2021   BUN 26 (H) 08/10/2021   CREATININE 1.28 (H) 08/10/2021   EGFR 47 (L) 08/10/2021   CALCIUM 10.6 (H) 08/10/2021   PROT 6.8 10/03/2021   BILITOT 0.7 10/03/2021   AST 17 10/03/2021   ALT 27 10/03/2021   ANIONGAP 5 04/03/2021   Last lipids Lab Results  Component Value Date   CHOL 136 08/10/2021   HDL 62 08/10/2021   LDLCALC 56 08/10/2021   TRIG 92 08/10/2021   CHOLHDL 2.2 08/10/2021   Last thyroid functions Lab Results  Component Value Date   TSH 1.260 10/22/2021   Last vitamin D Lab Results  Component Value Date   VD25OH 36 08/10/2021   Last vitamin B12 and Folate Lab Results  Component Value Date   MKLKJZPH15 056  10/22/2021   FOLATE 10.5 10/22/2021   The 10-year ASCVD risk score (Arnett DK, et al., 2019) is: 5.2%    Assessment & Plan:   Problem List Items Addressed This Visit     Hypertension  BP 129/67 today.  Medications refilled (carvedilol, chlorthalidone, hydralazine, lisinopril, and spironolactone)      Gastroesophageal reflux disease    Asymptomatic currently.  Lansoprazole refilled.      Hyperlipidemia    Zetia refilled today      Fall    She reports multiple recent falls.  Checking B12/folate and TSH/T4 today.      Need for influenza vaccination    Influenza vaccine administered today      Need for shingles vaccine    Shingrix vaccine administered today.      Return in 4 months (on 02/26/2022).    Johnette Abraham, MD

## 2021-10-23 DIAGNOSIS — Z23 Encounter for immunization: Secondary | ICD-10-CM | POA: Insufficient documentation

## 2021-10-23 LAB — B12 AND FOLATE PANEL
Folate: 10.5 ng/mL (ref >3.0)
Vitamin B-12: 802 pg/mL (ref 232–1245)

## 2021-10-23 LAB — TSH+FREE T4
Free T4: 0.74 ng/dL — ABNORMAL LOW (ref 0.82–1.77)
TSH: 1.26 u[IU]/mL (ref 0.450–4.500)

## 2021-10-23 NOTE — Assessment & Plan Note (Signed)
BP 129/67 today.  Medications refilled (carvedilol, chlorthalidone, hydralazine, lisinopril, and spironolactone)

## 2021-10-23 NOTE — Assessment & Plan Note (Signed)
Asymptomatic currently.  Lansoprazole refilled.

## 2021-10-23 NOTE — Assessment & Plan Note (Signed)
Zetia refilled today

## 2021-10-23 NOTE — Assessment & Plan Note (Signed)
Influenza vaccine administered today.

## 2021-10-23 NOTE — Assessment & Plan Note (Signed)
She reports multiple recent falls.  Checking B12/folate and TSH/T4 today.

## 2021-10-23 NOTE — Assessment & Plan Note (Signed)
Shingrix vaccine administered today 

## 2021-10-24 ENCOUNTER — Ambulatory Visit: Payer: BC Managed Care – PPO | Admitting: Internal Medicine

## 2021-10-29 DIAGNOSIS — Z6825 Body mass index (BMI) 25.0-25.9, adult: Secondary | ICD-10-CM | POA: Diagnosis not present

## 2021-10-29 DIAGNOSIS — M48062 Spinal stenosis, lumbar region with neurogenic claudication: Secondary | ICD-10-CM | POA: Diagnosis not present

## 2021-10-29 DIAGNOSIS — M8580 Other specified disorders of bone density and structure, unspecified site: Secondary | ICD-10-CM | POA: Diagnosis not present

## 2021-11-06 DIAGNOSIS — F4310 Post-traumatic stress disorder, unspecified: Secondary | ICD-10-CM | POA: Diagnosis not present

## 2021-11-09 DIAGNOSIS — F4310 Post-traumatic stress disorder, unspecified: Secondary | ICD-10-CM | POA: Diagnosis not present

## 2021-11-13 ENCOUNTER — Encounter: Payer: Self-pay | Admitting: Internal Medicine

## 2021-11-13 ENCOUNTER — Ambulatory Visit (INDEPENDENT_AMBULATORY_CARE_PROVIDER_SITE_OTHER): Payer: BC Managed Care – PPO | Admitting: Internal Medicine

## 2021-11-13 VITALS — BP 127/75 | HR 60 | Temp 98.1°F | Ht 66.0 in | Wt 153.4 lb

## 2021-11-13 DIAGNOSIS — R1310 Dysphagia, unspecified: Secondary | ICD-10-CM | POA: Diagnosis not present

## 2021-11-13 NOTE — Patient Instructions (Signed)
It was good to see you again today !  Congratulations again on weight loss!  As discussed, given your sisters history of colon polyps, we will go ahead and schedule a screening colonoscopy in the near future  - certainly before the end of the calendar year ASA 2  Because of your ongoing dysphagia, we will obtain a barium pill esophagram ASAP to further evaluate  Further recommendations to follow.

## 2021-11-13 NOTE — Progress Notes (Unsigned)
Primary Care Physician:  Billie Lade, MD Primary Gastroenterologist:  Dr. Jena Gauss  Pre-Procedure History & Physical: HPI:  Erica Gallegos is a 66 y.o. female here for follow-up of GERD/dysphagia.  Schatzki's ring found earlier this year-status post Maloney dilation.  Patient states the dysphagia improvement was only short-lived.  Now, with recurrent symptoms.  She tells me that lansoprazole 40 mg twice daily has been of minimum benefit combating her reflux symptoms. No abdominal pain. no melena or rectal bleeding.  History of fatty liver with normal LFTs more recently.  She has accomplished a 22 pound weight loss since being seen here back in March. He was commended.  She is having major spine surgery next year.  Past Medical History:  Diagnosis Date   Anxiety    Arthritis    Complication of anesthesia    Patient woke up during anesthesia with bunionectomy   GERD (gastroesophageal reflux disease)    HLD (hyperlipidemia)    HTN (hypertension)    Major depressive disorder    Migraine with aura    PONV (postoperative nausea and vomiting)    RAS (renal artery stenosis) (HCC)    Sleep apnea    Sleep related hypoxia    Spinal stenosis     Past Surgical History:  Procedure Laterality Date   BUNIONECTOMY     CATARACT EXTRACTION     CHOLECYSTECTOMY     ESOPHAGOGASTRODUODENOSCOPY (EGD) WITH PROPOFOL N/A 04/05/2021   Procedure: ESOPHAGOGASTRODUODENOSCOPY (EGD) WITH PROPOFOL;  Surgeon: Corbin Ade, MD;  [facility];  Service: Endoscopy;  Laterality: N/A;  10:15am   MALONEY DILATION N/A 04/05/2021   Procedure: Elease Hashimoto DILATION;  Surgeon: Corbin Ade, MD;  [facility];  Service: Endoscopy;  Laterality: N/A;    Prior to Admission medications   Medication Sig Start Date End Date Taking? Authorizing Provider  atorvastatin (LIPITOR) 40 MG tablet Take 1 tablet (40 mg total) by mouth daily. 07/24/21  Yes Paseda, Baird Kay, FNP  buPROPion (WELLBUTRIN SR)  200 MG 12 hr tablet Take 200 mg by mouth 2 (two) times daily. 12/24/19  Yes [provider]  Calcium Carb-Cholecalciferol (CALTRATE 600+D3) 600-20 MG-MCG TABS Take 600 mg by mouth daily. 08/11/21  Yes Paseda, Phillips Grout R, FNP  carvedilol (COREG) 12.5 MG tablet TAKE 1 TABLET(12.5 MG) BY MOUTH TWICE DAILY 10/22/21  Yes Billie Lade, MD  chlorthalidone (HYGROTON) 25 MG tablet TAKE 1 AND 1/2 TABLETS(37.5 MG) BY MOUTH DAILY 10/22/21  Yes Billie Lade, MD  Cholecalciferol (VITAMIN D3) 50 MCG (2000 UT) TABS Take 2,000 Units by mouth daily.   Yes [provider]  clonazePAM (KLONOPIN) 0.5 MG tablet Take 0.5 mg by mouth daily as needed for anxiety. 02/29/20  Yes [provider]  cyclobenzaprine (FLEXERIL) 10 MG tablet Take 10 mg at bedtime daily as needed for back pain or nervesTake 10 mg at bedtime daily as needed for back pain or nerves 10/17/21  Yes Billie Lade, MD  diazepam (VALIUM) 5 MG tablet Take 5 mg by mouth every 6 (six) hours as needed. 10/23/21  Yes [provider]  diclofenac (VOLTAREN) 50 MG EC tablet Take 50 mg by mouth 2 (two) times daily as needed. 10/26/21  Yes [provider]  DULoxetine (CYMBALTA) 60 MG capsule Take 60 mg by mouth 2 (two) times daily.   Yes [provider]  ezetimibe (ZETIA) 10 MG tablet Take 1 tablet (10 mg total) by mouth daily. 10/22/21  Yes Dixon,  Hazle Nordmann, MD  gabapentin (NEURONTIN) 300 MG capsule 300 mg in the morning and 600 mg at bedtime additional 300 mg if needed during the day 07/24/21  Yes Paseda, Dewaine Conger, FNP  hydrALAZINE (APRESOLINE) 100 MG tablet Take 1 tablet (100 mg total) by mouth 2 (two) times daily. 10/22/21 01/20/22 Yes Johnette Abraham, MD  lamoTRIgine (LAMICTAL) 25 MG tablet Take 2 tablets (50 mg total) by mouth daily. 07/03/21  Yes Paseda, Dewaine Conger, FNP  lansoprazole (PREVACID) 30 MG capsule TAKE 1 CAPSULE(30 MG) BY MOUTH TWICE DAILY BEFORE A MEAL 10/22/21  Yes Johnette Abraham, MD   lisinopril (ZESTRIL) 40 MG tablet Take 1 tablet (40 mg total) by mouth daily. 10/22/21  Yes Johnette Abraham, MD  melatonin 5 MG TABS Take 10 mg by mouth at bedtime as needed (Sleep).   Yes [provider]  Omega-3 Fatty Acids (FISH OIL) 1000 MG CAPS Take 4,000 mg by mouth daily. 11/24/19  Yes [provider]  spironolactone (ALDACTONE) 25 MG tablet TAKE 1 TABLET(25 MG) BY MOUTH DAILY 10/22/21  Yes Johnette Abraham, MD  vitamin B-12 (CYANOCOBALAMIN) 500 MCG tablet Take 500 mcg by mouth daily.   Yes [provider]  vitamin C (ASCORBIC ACID) 500 MG tablet Take 500 mg by mouth daily.   Yes [provider]    Allergies as of 11/13/2021 - Review Complete 11/13/2021  Allergen Reaction Noted   Atenolol Swelling 07/24/2021   Amlodipine Swelling 08/28/2020    Family History  Problem Relation Age of Onset   Hypertension Mother    Diabetes Mother    Heart disease Mother    Stroke Father    Hypertension Father    Heart disease Father    Hypertension Sister    Lupus Sister    Hypertension Sister    Cancer Brother    Brain cancer Brother    Non-Hodgkin's lymphoma Brother    Colon cancer Neg Hx     Social History   Socioeconomic History   Marital status: Married    Spouse name: Not on file   Number of children: 3   Years of education: Not on file   Highest education level: Not on file  Occupational History   Not on file  Tobacco Use   Smoking status: Former    Types: Cigarettes   Smokeless tobacco: Never   Tobacco comments:    She quit smoking in the early 90's  Substance and Sexual Activity   Alcohol use: Yes    Comment: seldom   Drug use: Yes    Types: Marijuana    Comment: CBD cream; Marijuana a couple times a week. helps with her anxiety and migraines.   Sexual activity: Yes    Birth control/protection: None, Post-menopausal  Other Topics Concern   Not on file  Social History Narrative   Lives with her husband, retired.    Social  Determinants of Health   Financial Resource Strain: Unknown (08/28/2020)   Overall Financial Resource Strain (CARDIA)    Difficulty of Paying Living Expenses: Patient refused  Food Insecurity: Unknown (08/28/2020)   Hunger Vital Sign    Worried About Running Out of Food in the Last Year: Patient refused    [place] in the Last Year: Patient refused  Transportation Needs: Unknown (08/28/2020)   PRAPARE - Transportation    Lack of Transportation (Medical): Patient refused    Lack of Transportation (Non-Medical): Patient refused  Physical Activity: Unknown (08/28/2020)  Exercise Vital Sign    Days of Exercise per Week: Patient refused    Minutes of Exercise per Session: Patient refused  Stress: Unknown (08/28/2020)   [place]    Feeling of Stress : Patient refused  Social Connections: Unknown (08/28/2020)   Social Connection and Isolation Panel [NHANES]    Frequency of Communication with Friends and Family: Patient refused    Frequency of Social Gatherings with Friends and Family: Patient refused    Attends Religious Services: Patient refused    Active Member of Clubs or Organizations: Patient refused    Attends Archivist Meetings: Patient refused    Marital Status: Patient refused  Intimate Partner Violence: Unknown (08/28/2020)   Humiliation, Afraid, Rape, and Kick questionnaire    Fear of Current or Ex-Partner: Patient refused    Emotionally Abused: Patient refused    Physically Abused: Patient refused    Sexually Abused: Patient refused    Review of Systems: See HPI, otherwise negative ROS  Physical Exam: BP 127/75 (BP Location: Left Arm, Patient Position: Sitting, Cuff Size: Normal)   Pulse 60   Temp 98.1 F (36.7 C) (Oral)   Ht 5\' 6"  (1.676 m)   Wt 153 lb 6.4 oz (69.6 kg)   SpO2 96%   BMI 24.76 kg/m  General:   Alert,   pleasant and cooperative in NAD Neck:  Supple; no masses or  thyromegaly. No significant cervical adenopathy. Lungs:  Clear throughout to auscultation.   No wheezes, crackles, or rhonchi. No acute distress. Heart:  Regular rate and rhythm; no murmurs, clicks, rubs,  or gallops. Abdomen: Non-distended, normal bowel sounds.  Soft and nontender without appreciable mass or hepatosplenomegaly.  Pulses:  Normal pulses noted. Extremities:  Without clubbing or edema.  Impression/Plan: 66 year old lady with GERD.  Symptoms somewhat refractory to twice daily PPI therapy.  Esophageal dysphagia.  Inadequate response to  [facility] dilation of a Schatzki's ring.  Patient now tells me sister had colon polyps and is in a surveillance program.  Last colonoscopy in [place] 5 years ago-reportedly negative.  Records not available.  Recommendations:  As discussed, given your sisters history of colon polyps, we will go ahead and schedule a screening colonoscopy in the near future  - certainly before the end of the calendar year ASA 2  Because of your ongoing dysphagia, we will obtain a barium pill esophagram ASAP to further evaluate  Further recommendations to follow.  Notice: This dictation was prepared with Dragon dictation along with smaller phrase technology. Any transcriptional errors that result from this process are unintentional and may not be corrected upon review.

## 2021-11-13 NOTE — H&P (View-Only) (Signed)
  Primary Care Physician:  Dixon, Phillip E, MD Primary Gastroenterologist:  Dr. Benen Weida  Pre-Procedure History & Physical: HPI:  Erica Gallegos is a 66 y.o. female here for follow-up of GERD/dysphagia.  Schatzki's ring found earlier this year-status post Maloney dilation.  Patient states the dysphagia improvement was only short-lived.  Now, with recurrent symptoms.  She tells me that lansoprazole 40 mg twice daily has been of minimum benefit combating her reflux symptoms. No abdominal pain. no melena or rectal bleeding.  History of fatty liver with normal LFTs more recently.  She has accomplished a 22 pound weight loss since being seen here back in March. He was commended.  She is having major spine surgery next year.  Past Medical History:  Diagnosis Date   Anxiety    Arthritis    Complication of anesthesia    Patient woke up during anesthesia with bunionectomy   GERD (gastroesophageal reflux disease)    HLD (hyperlipidemia)    HTN (hypertension)    Major depressive disorder    Migraine with aura    PONV (postoperative nausea and vomiting)    RAS (renal artery stenosis) (HCC)    Sleep apnea    Sleep related hypoxia    Spinal stenosis     Past Surgical History:  Procedure Laterality Date   BUNIONECTOMY     CATARACT EXTRACTION     CHOLECYSTECTOMY     ESOPHAGOGASTRODUODENOSCOPY (EGD) WITH PROPOFOL N/A 04/05/2021   Procedure: ESOPHAGOGASTRODUODENOSCOPY (EGD) WITH PROPOFOL;  Surgeon: Kharter Sestak M, MD;  [facility];  Service: Endoscopy;  Laterality: N/A;  10:15am   MALONEY DILATION N/A 04/05/2021   Procedure: MALONEY DILATION;  Surgeon: Jerolene Kupfer M, MD;  Location: [facility];  Service: Endoscopy;  Laterality: N/A;    Prior to Admission medications   Medication Sig Start Date End Date Taking? Authorizing Provider  atorvastatin (LIPITOR) 40 MG tablet Take 1 tablet (40 mg total) by mouth daily. 07/24/21  Yes Paseda, Folashade R, FNP  buPROPion (WELLBUTRIN SR)  200 MG 12 hr tablet Take 200 mg by mouth 2 (two) times daily. 12/24/19  Yes [provider]  Calcium Carb-Cholecalciferol (CALTRATE 600+D3) 600-20 MG-MCG TABS Take 600 mg by mouth daily. 08/11/21  Yes Paseda, Folashade R, FNP  carvedilol (COREG) 12.5 MG tablet TAKE 1 TABLET(12.5 MG) BY MOUTH TWICE DAILY 10/22/21  Yes Dixon, Phillip E, MD  chlorthalidone (HYGROTON) 25 MG tablet TAKE 1 AND 1/2 TABLETS(37.5 MG) BY MOUTH DAILY 10/22/21  Yes Dixon, Phillip E, MD  Cholecalciferol (VITAMIN D3) 50 MCG (2000 UT) TABS Take 2,000 Units by mouth daily.   Yes [provider]  clonazePAM (KLONOPIN) 0.5 MG tablet Take 0.5 mg by mouth daily as needed for anxiety. 02/29/20  Yes [provider]  cyclobenzaprine (FLEXERIL) 10 MG tablet Take 10 mg at bedtime daily as needed for back pain or nervesTake 10 mg at bedtime daily as needed for back pain or nerves 10/17/21  Yes Dixon, Phillip E, MD  diazepam (VALIUM) 5 MG tablet Take 5 mg by mouth every 6 (six) hours as needed. 10/23/21  Yes [provider]  diclofenac (VOLTAREN) 50 MG EC tablet Take 50 mg by mouth 2 (two) times daily as needed. 10/26/21  Yes [provider]  DULoxetine (CYMBALTA) 60 MG capsule Take 60 mg by mouth 2 (two) times daily.   Yes [provider]  ezetimibe (ZETIA) 10 MG tablet Take 1 tablet (10 mg total) by mouth daily. 10/22/21  Yes Dixon,   Hazle Nordmann, MD  gabapentin (NEURONTIN) 300 MG capsule 300 mg in the morning and 600 mg at bedtime additional 300 mg if needed during the day 07/24/21  Yes Paseda, Dewaine Conger, FNP  hydrALAZINE (APRESOLINE) 100 MG tablet Take 1 tablet (100 mg total) by mouth 2 (two) times daily. 10/22/21 01/20/22 Yes Johnette Abraham, MD  lamoTRIgine (LAMICTAL) 25 MG tablet Take 2 tablets (50 mg total) by mouth daily. 07/03/21  Yes Paseda, Dewaine Conger, FNP  lansoprazole (PREVACID) 30 MG capsule TAKE 1 CAPSULE(30 MG) BY MOUTH TWICE DAILY BEFORE A MEAL 10/22/21  Yes Johnette Abraham, MD   lisinopril (ZESTRIL) 40 MG tablet Take 1 tablet (40 mg total) by mouth daily. 10/22/21  Yes Johnette Abraham, MD  melatonin 5 MG TABS Take 10 mg by mouth at bedtime as needed (Sleep).   Yes [provider]  Omega-3 Fatty Acids (FISH OIL) 1000 MG CAPS Take 4,000 mg by mouth daily. 11/24/19  Yes [provider]  spironolactone (ALDACTONE) 25 MG tablet TAKE 1 TABLET(25 MG) BY MOUTH DAILY 10/22/21  Yes Johnette Abraham, MD  vitamin B-12 (CYANOCOBALAMIN) 500 MCG tablet Take 500 mcg by mouth daily.   Yes [provider]  vitamin C (ASCORBIC ACID) 500 MG tablet Take 500 mg by mouth daily.   Yes [provider]    Allergies as of 11/13/2021 - Review Complete 11/13/2021  Allergen Reaction Noted   Atenolol Swelling 07/24/2021   Amlodipine Swelling 08/28/2020    Family History  Problem Relation Age of Onset   Hypertension Mother    Diabetes Mother    Heart disease Mother    Stroke Father    Hypertension Father    Heart disease Father    Hypertension Sister    Lupus Sister    Hypertension Sister    Cancer Brother    Brain cancer Brother    Non-Hodgkin's lymphoma Brother    Colon cancer Neg Hx     Social History   Socioeconomic History   Marital status: Married    Spouse name: Not on file   Number of children: 3   Years of education: Not on file   Highest education level: Not on file  Occupational History   Not on file  Tobacco Use   Smoking status: Former    Types: Cigarettes   Smokeless tobacco: Never   Tobacco comments:    She quit smoking in the early 90's  Substance and Sexual Activity   Alcohol use: Yes    Comment: seldom   Drug use: Yes    Types: Marijuana    Comment: CBD cream; Marijuana a couple times a week. helps with her anxiety and migraines.   Sexual activity: Yes    Birth control/protection: None, Post-menopausal  Other Topics Concern   Not on file  Social History Narrative   Lives with her husband, retired.    Social  Determinants of Health   Financial Resource Strain: Unknown (08/28/2020)   Overall Financial Resource Strain (CARDIA)    Difficulty of Paying Living Expenses: Patient refused  Food Insecurity: Unknown (08/28/2020)   Hunger Vital Sign    Worried About Running Out of Food in the Last Year: Patient refused    [place] in the Last Year: Patient refused  Transportation Needs: Unknown (08/28/2020)   PRAPARE - Transportation    Lack of Transportation (Medical): Patient refused    Lack of Transportation (Non-Medical): Patient refused  Physical Activity: Unknown (08/28/2020)  Exercise Vital Sign    Days of Exercise per Week: Patient refused    Minutes of Exercise per Session: Patient refused  Stress: Unknown (08/28/2020)   [place]    Feeling of Stress : Patient refused  Social Connections: Unknown (08/28/2020)   Social Connection and Isolation Panel [NHANES]    Frequency of Communication with Friends and Family: Patient refused    Frequency of Social Gatherings with Friends and Family: Patient refused    Attends Religious Services: Patient refused    Active Member of Clubs or Organizations: Patient refused    Attends Archivist Meetings: Patient refused    Marital Status: Patient refused  Intimate Partner Violence: Unknown (08/28/2020)   Humiliation, Afraid, Rape, and Kick questionnaire    Fear of Current or Ex-Partner: Patient refused    Emotionally Abused: Patient refused    Physically Abused: Patient refused    Sexually Abused: Patient refused    Review of Systems: See HPI, otherwise negative ROS  Physical Exam: BP 127/75 (BP Location: Left Arm, Patient Position: Sitting, Cuff Size: Normal)   Pulse 60   Temp 98.1 F (36.7 C) (Oral)   Ht 5\' 6"  (1.676 m)   Wt 153 lb 6.4 oz (69.6 kg)   SpO2 96%   BMI 24.76 kg/m  General:   Alert,   pleasant and cooperative in NAD Neck:  Supple; no masses or  thyromegaly. No significant cervical adenopathy. Lungs:  Clear throughout to auscultation.   No wheezes, crackles, or rhonchi. No acute distress. Heart:  Regular rate and rhythm; no murmurs, clicks, rubs,  or gallops. Abdomen: Non-distended, normal bowel sounds.  Soft and nontender without appreciable mass or hepatosplenomegaly.  Pulses:  Normal pulses noted. Extremities:  Without clubbing or edema.  Impression/Plan: 66 year old lady with GERD.  Symptoms somewhat refractory to twice daily PPI therapy.  Esophageal dysphagia.  Inadequate response to  [facility] dilation of a Schatzki's ring.  Patient now tells me sister had colon polyps and is in a surveillance program.  Last colonoscopy in [place] 5 years ago-reportedly negative.  Records not available.  Recommendations:  As discussed, given your sisters history of colon polyps, we will go ahead and schedule a screening colonoscopy in the near future  - certainly before the end of the calendar year ASA 2  Because of your ongoing dysphagia, we will obtain a barium pill esophagram ASAP to further evaluate  Further recommendations to follow.  Notice: This dictation was prepared with Dragon dictation along with smaller phrase technology. Any transcriptional errors that result from this process are unintentional and may not be corrected upon review.

## 2021-11-14 ENCOUNTER — Ambulatory Visit (HOSPITAL_COMMUNITY)
Admission: RE | Admit: 2021-11-14 | Discharge: 2021-11-14 | Disposition: A | Discharge status: Home / Self Care | Payer: BC Managed Care – PPO | Source: Ambulatory Visit | Attending: Internal Medicine | Admitting: Internal Medicine

## 2021-11-14 DIAGNOSIS — R1310 Dysphagia, unspecified: Secondary | ICD-10-CM | POA: Insufficient documentation

## 2021-11-14 DIAGNOSIS — R059 Cough, unspecified: Secondary | ICD-10-CM | POA: Diagnosis not present

## 2021-11-16 ENCOUNTER — Telehealth: Payer: Self-pay | Admitting: *Deleted

## 2021-11-16 DIAGNOSIS — R1310 Dysphagia, unspecified: Secondary | ICD-10-CM

## 2021-11-16 DIAGNOSIS — Z1211 Encounter for screening for malignant neoplasm of colon: Secondary | ICD-10-CM

## 2021-11-16 MED ORDER — PEG 3350-KCL-NA BICARB-NACL 420 G PO SOLR: 4000.0000 mL | Freq: Once | ORAL | 0 refills | Status: AC

## 2021-11-16 NOTE — Telephone Encounter (Signed)
Spoke with pt. Scheduled for tcs/egd/ed w/ propofol asa 2 on 12/7 at 8am. Advised will mail instructions. Aware needs lab work done prior. Rx for prep sent to pharmacy.

## 2021-11-21 ENCOUNTER — Other Ambulatory Visit: Payer: Self-pay | Admitting: Internal Medicine

## 2021-11-21 ENCOUNTER — Telehealth: Payer: Self-pay | Admitting: Internal Medicine

## 2021-11-21 NOTE — Telephone Encounter (Signed)
Patient called need med refill  cyclobenzaprine (FLEXERIL) 10 MG tablet   Pharmacy  [id number] - [place], [place] - [address], [place] [address] Phone: [phone]  Fax: [phone]

## 2021-11-21 NOTE — Telephone Encounter (Signed)
Refills sent

## 2021-11-22 ENCOUNTER — Telehealth: Payer: Self-pay | Admitting: Pulmonary Disease

## 2021-11-22 DIAGNOSIS — G4733 Obstructive sleep apnea (adult) (pediatric): Secondary | ICD-10-CM

## 2021-11-22 NOTE — Telephone Encounter (Signed)
Order placed nothing further needed.  °

## 2021-11-26 NOTE — Telephone Encounter (Signed)
Pt called and left voicemail that she is having a colonoscopy and upper endoscopy done but did not receive instructions for the upper endoscopy. Advised pt just to follow the directions for the colonoscopy for the upper endoscopy. Verbalized  understanding.

## 2021-12-04 ENCOUNTER — Other Ambulatory Visit (HOSPITAL_COMMUNITY)
Admission: RE | Admit: 2021-12-04 | Discharge: 2021-12-04 | Disposition: A | Discharge status: Home / Self Care | Payer: BC Managed Care – PPO | Source: Ambulatory Visit | Attending: Internal Medicine | Admitting: Internal Medicine

## 2021-12-04 DIAGNOSIS — R1310 Dysphagia, unspecified: Secondary | ICD-10-CM | POA: Diagnosis not present

## 2021-12-04 DIAGNOSIS — Z1211 Encounter for screening for malignant neoplasm of colon: Secondary | ICD-10-CM | POA: Diagnosis not present

## 2021-12-04 DIAGNOSIS — G4733 Obstructive sleep apnea (adult) (pediatric): Secondary | ICD-10-CM | POA: Diagnosis not present

## 2021-12-04 LAB — BASIC METABOLIC PANEL
Anion gap: 3 — ABNORMAL LOW (ref 5–15)
BUN: 27 mg/dL — ABNORMAL HIGH (ref 8–23)
CO2: 24 mmol/L (ref 22–32)
Calcium: 9.3 mg/dL (ref 8.9–10.3)
Chloride: 112 mmol/L — ABNORMAL HIGH (ref 98–111)
Creatinine, Ser: 1.61 mg/dL — ABNORMAL HIGH (ref 0.44–1.00)
GFR, Estimated: 36 mL/min — ABNORMAL LOW (ref >60)
Glucose, Bld: 82 mg/dL (ref 70–99)
Potassium: 4.9 mmol/L (ref 3.5–5.1)
Sodium: 139 mmol/L (ref 135–145)

## 2021-12-05 DIAGNOSIS — F4310 Post-traumatic stress disorder, unspecified: Secondary | ICD-10-CM | POA: Diagnosis not present

## 2021-12-07 DIAGNOSIS — F4310 Post-traumatic stress disorder, unspecified: Secondary | ICD-10-CM | POA: Diagnosis not present

## 2021-12-13 ENCOUNTER — Other Ambulatory Visit: Payer: Self-pay

## 2021-12-13 ENCOUNTER — Ambulatory Visit (HOSPITAL_COMMUNITY): Payer: BC Managed Care – PPO | Admitting: Anesthesiology

## 2021-12-13 ENCOUNTER — Ambulatory Visit (HOSPITAL_COMMUNITY)
Admission: RE | Admit: 2021-12-13 | Discharge: 2021-12-13 | Disposition: A | Discharge status: Home / Self Care | Payer: BC Managed Care – PPO | Attending: Internal Medicine | Admitting: Internal Medicine

## 2021-12-13 ENCOUNTER — Encounter (HOSPITAL_COMMUNITY): Payer: Self-pay | Admitting: Internal Medicine

## 2021-12-13 ENCOUNTER — Encounter (HOSPITAL_COMMUNITY)
Admission: RE | Disposition: A | Discharge status: Home / Self Care | Payer: Self-pay | Source: Home / Self Care | Attending: Internal Medicine

## 2021-12-13 DIAGNOSIS — K219 Gastro-esophageal reflux disease without esophagitis: Secondary | ICD-10-CM | POA: Diagnosis not present

## 2021-12-13 DIAGNOSIS — I10 Essential (primary) hypertension: Secondary | ICD-10-CM | POA: Insufficient documentation

## 2021-12-13 DIAGNOSIS — G4730 Sleep apnea, unspecified: Secondary | ICD-10-CM | POA: Insufficient documentation

## 2021-12-13 DIAGNOSIS — Z83711 Family history of hyperplastic colon polyps: Secondary | ICD-10-CM | POA: Diagnosis not present

## 2021-12-13 DIAGNOSIS — I739 Peripheral vascular disease, unspecified: Secondary | ICD-10-CM | POA: Diagnosis not present

## 2021-12-13 DIAGNOSIS — K222 Esophageal obstruction: Secondary | ICD-10-CM | POA: Insufficient documentation

## 2021-12-13 DIAGNOSIS — F419 Anxiety disorder, unspecified: Secondary | ICD-10-CM | POA: Insufficient documentation

## 2021-12-13 DIAGNOSIS — R1310 Dysphagia, unspecified: Secondary | ICD-10-CM | POA: Diagnosis not present

## 2021-12-13 DIAGNOSIS — G709 Myoneural disorder, unspecified: Secondary | ICD-10-CM | POA: Diagnosis not present

## 2021-12-13 DIAGNOSIS — F32A Depression, unspecified: Secondary | ICD-10-CM | POA: Insufficient documentation

## 2021-12-13 DIAGNOSIS — Z87891 Personal history of nicotine dependence: Secondary | ICD-10-CM | POA: Diagnosis not present

## 2021-12-13 DIAGNOSIS — Z83719 Family history of colon polyps, unspecified: Secondary | ICD-10-CM

## 2021-12-13 DIAGNOSIS — K449 Diaphragmatic hernia without obstruction or gangrene: Secondary | ICD-10-CM | POA: Insufficient documentation

## 2021-12-13 DIAGNOSIS — Z79899 Other long term (current) drug therapy: Secondary | ICD-10-CM | POA: Insufficient documentation

## 2021-12-13 DIAGNOSIS — Z1211 Encounter for screening for malignant neoplasm of colon: Secondary | ICD-10-CM | POA: Insufficient documentation

## 2021-12-13 DIAGNOSIS — Q438 Other specified congenital malformations of intestine: Secondary | ICD-10-CM | POA: Insufficient documentation

## 2021-12-13 DIAGNOSIS — M1990 Unspecified osteoarthritis, unspecified site: Secondary | ICD-10-CM | POA: Diagnosis not present

## 2021-12-13 DIAGNOSIS — K640 First degree hemorrhoids: Secondary | ICD-10-CM | POA: Insufficient documentation

## 2021-12-13 DIAGNOSIS — Z1212 Encounter for screening for malignant neoplasm of rectum: Secondary | ICD-10-CM

## 2021-12-13 HISTORY — PX: COLONOSCOPY WITH PROPOFOL: SHX5780

## 2021-12-13 HISTORY — PX: MALONEY DILATION: SHX5535

## 2021-12-13 HISTORY — PX: ESOPHAGOGASTRODUODENOSCOPY (EGD) WITH PROPOFOL: SHX5813

## 2021-12-13 SURGERY — COLONOSCOPY WITH PROPOFOL
Anesthesia: General

## 2021-12-13 MED ORDER — LACTATED RINGERS IV SOLN: INTRAVENOUS | Status: DC

## 2021-12-13 MED ORDER — PROPOFOL 10 MG/ML IV BOLUS
INTRAVENOUS | Status: DC | PRN
  Administered 2021-12-13: 80 mg via INTRAVENOUS

## 2021-12-13 MED ORDER — PROPOFOL 1000 MG/100ML IV EMUL
INTRAVENOUS | Status: AC
  Filled 2021-12-13: qty 100

## 2021-12-13 MED ORDER — EPHEDRINE 5 MG/ML INJ
INTRAVENOUS | Status: AC
  Filled 2021-12-13: qty 5

## 2021-12-13 MED ORDER — PROPOFOL 500 MG/50ML IV EMUL
INTRAVENOUS | Status: DC | PRN
  Administered 2021-12-13: 180 ug/kg/min via INTRAVENOUS

## 2021-12-13 MED ORDER — EPHEDRINE SULFATE-NACL 50-0.9 MG/10ML-% IV SOSY
PREFILLED_SYRINGE | INTRAVENOUS | Status: DC | PRN
  Administered 2021-12-13: 2.5 mg via INTRAVENOUS
  Administered 2021-12-13: 10 mg via INTRAVENOUS
  Administered 2021-12-13 (×2): 25 mg via INTRAVENOUS
  Administered 2021-12-13: 2.5 mg via INTRAVENOUS

## 2021-12-13 MED ORDER — STERILE WATER FOR IRRIGATION IR SOLN
Status: DC | PRN
  Administered 2021-12-13: 60 mL

## 2021-12-13 MED ORDER — PHENYLEPHRINE HCL (PRESSORS) 10 MG/ML IV SOLN
INTRAVENOUS | Status: DC | PRN
  Administered 2021-12-13: 160 ug via INTRAVENOUS

## 2021-12-13 MED ORDER — PHENYLEPHRINE 80 MCG/ML (10ML) SYRINGE FOR IV PUSH (FOR BLOOD PRESSURE SUPPORT)
PREFILLED_SYRINGE | INTRAVENOUS | Status: AC
  Filled 2021-12-13: qty 10

## 2021-12-13 NOTE — Op Note (Signed)
[facility] Patient Name: Erica Gallegos Procedure Date: 12/13/2021 7:30 AM MRN: [id number] Date of Birth: 1955/10/06 Attending MD: Gennette Pac , MD, [id number] CSN: [id number] Age: 66 Admit Type: Outpatient Procedure:                Colonoscopy Indications:              Colon cancer screening in patient at increased                            risk: Family history of 1st-degree relative with                            colon polyps Providers:                Gennette Pac, MD, Angelica Ran, Pandora Leiter, Technician Referring MD:              Medicines:                Propofol per Anesthesia Complications:            No immediate complications. Estimated Blood Loss:     Estimated blood loss: none. Procedure:                Pre-Anesthesia Assessment:                           - Prior to the procedure, a History and Physical                            was performed, and patient medications and                            allergies were reviewed. The patient's tolerance of                            previous anesthesia was also reviewed. The risks                            and benefits of the procedure and the sedation                            options and risks were discussed with the patient.                            All questions were answered, and informed consent                            was obtained. Prior Anticoagulants: The patient has                            taken no anticoagulant or antiplatelet agents. ASA                            Grade  Assessment: III - A patient with severe                            systemic disease. After reviewing the risks and                            benefits, the patient was deemed in satisfactory                            condition to undergo the procedure.                           After obtaining informed consent, the colonoscope                            was passed under direct vision.  Throughout the                            procedure, the patient's blood pressure, pulse, and                            oxygen saturations were monitored continuously. The                            [id number]) scope was introduced through the                            anus and advanced to the the cecum, identified by                            appendiceal orifice and ileocecal valve. The                            colonoscopy was performed without difficulty. The                            patient tolerated the procedure well. The quality                            of the bowel preparation was adequate. The entire                            colon was well visualized. The ileocecal valve,                            appendiceal orifice, and rectum were photographed. Scope In: 8:07:27 AM Scope Out: 8:31:07 AM Scope Withdrawal Time: 0 hours 9 minutes 16 seconds  Total Procedure Duration: 0 hours 23 minutes 40 seconds  Findings:      The perianal and digital rectal examinations were normal. Right colon       was most redundant. Changing of the patient's position and external       abdominal pressure required to reach the cecum.      The colon (entire examined portion) appeared normal.  Non-bleeding internal hemorrhoids were found during retroflexion. The       hemorrhoids were mild, small and Grade I (internal hemorrhoids that do       not prolapse).      The exam was otherwise without abnormality on direct and retroflexion       views. Impression:               - The entire examined colon is normal (redundant).                           - Non-bleeding internal hemorrhoids.                           - The examination was otherwise normal on direct                            and retroflexion views.                           - No specimens collected. Moderate Sedation:      Moderate (conscious) sedation was personally administered by an       anesthesia professional. The  following parameters were monitored: oxygen       saturation, heart rate, blood pressure, respiratory rate, EKG, adequacy       of pulmonary ventilation, and response to care. Total physician       intraservice time was 15 minutes. Recommendation:           - Patient has a contact number available for                            emergencies. The signs and symptoms of potential                            delayed complications were discussed with the                            patient. Return to normal activities tomorrow.                            Written discharge instructions were provided to the                            patient.                           - Advance diet as tolerated.                           - Continue present medications.                           - Repeat colonoscopy in 5 years for screening                            purposes.                           -  Return to GI office in 3 months. See EGD report. Procedure Code(s):        --- Professional ---                           [phone], Colonoscopy, flexible; diagnostic, including                            collection of specimen(s) by brushing or washing,                            when performed (separate procedure) Diagnosis Code(s):        --- Professional ---                           Z83.71, Family history of colonic polyps                           K64.0, First degree hemorrhoids CPT copyright 2022 American Medical Association. All rights reserved. The codes documented in this report are preliminary and upon coder review may  be revised to meet current compliance requirements. [name]. [name], MD Gennette Pac, MD 12/13/2021 8:42:50 AM This report has been signed electronically. Number of Addenda: 0

## 2021-12-13 NOTE — Anesthesia Postprocedure Evaluation (Signed)
Anesthesia Post Note  Patient: Erica Gallegos  Procedure(s) Performed: COLONOSCOPY WITH PROPOFOL ESOPHAGOGASTRODUODENOSCOPY (EGD) WITH PROPOFOL MALONEY DILATION  Patient location during evaluation: Phase II Anesthesia Type: General Level of consciousness: awake and alert and oriented Pain management: pain level controlled Vital Signs Assessment: post-procedure vital signs reviewed and stable Respiratory status: spontaneous breathing, nonlabored ventilation and respiratory function stable Cardiovascular status: blood pressure returned to baseline and stable Postop Assessment: no apparent nausea or vomiting Anesthetic complications: no  No notable events documented.   Last Vitals:  Vitals:   12/13/21 0837 12/13/21 0841  BP: 130/86 (!) 128/95  Pulse: 70 65  Resp: (!) 21   Temp: (!) 36.1 C   SpO2: 95% 96%    Last Pain:  Vitals:   12/13/21 0841  TempSrc:   PainSc: 0-No pain                 Nickoli Bagheri C [name]

## 2021-12-13 NOTE — Op Note (Signed)
[facility] Patient Name: Erica ComesKathleen Gallegos Procedure Date: 12/13/2021 7:32 AM MRN: [id number] Date of Birth: 11-01-1955 Attending MD: Gennette Pacobert Michael Laritza Vokes , MD, [id number] CSN: [id number] Age: [age] Admit Type: Outpatient Procedure:                Upper GI endoscopy Indications:              Dysphagia, Abnormal cine-esophagram Providers:                Gennette Pacobert Michael Issai Werling, MD, Angelica RanSusan Goss, Pandora LeiterNeville                            David, Technician Referring MD:              Medicines:                Propofol per Anesthesia Complications:            No immediate complications. Estimated Blood Loss:     Estimated blood loss was minimal. Procedure:                Pre-Anesthesia Assessment:                           - Prior to the procedure, a History and Physical                            was performed, and patient medications and                            allergies were reviewed. The patient's tolerance of                            previous anesthesia was also reviewed. The risks                            and benefits of the procedure and the sedation                            options and risks were discussed with the patient.                            All questions were answered, and informed consent                            was obtained. Prior Anticoagulants: The patient has                            taken no anticoagulant or antiplatelet agents. ASA                            Grade Assessment: III - A patient with severe                            systemic disease. After reviewing the risks and  benefits, the patient was deemed in satisfactory                            condition to undergo the procedure.                           After obtaining informed consent, the endoscope was                            passed under direct vision. Throughout the                            procedure, the patient's blood pressure, pulse, and                             oxygen saturations were monitored continuously. The                            [id number] scope was introduced through the                            mouth, and advanced to the second part of duodenum.                            The upper GI endoscopy was accomplished without                            difficulty. The patient tolerated the procedure                            well. Scope In: Scope Out: Findings:      Noncritical appearing Schatzki's ring. No esophagitis. No Barrett's. No       tumor. No external compression. Tubular esophagus. Patent throughout its       course. Small hiatal hernia stomach MD normal gastric mucosa patent       pylorus normal D1 and D2.      Scope withdrawn. A 56 French Maloney dilators passed to full insertion       easily with no resistance. Subsequently, a 63 French Maloney dilators       passed to full insertion with no resistance. Finally, a 60 [place]       Maloney dilator was passed to full insertion with only mild resistance.       A look back revealed essentially no change with a slight amount of blood       straddling the GE junction. Ring appeared to be very much noncritical. I       let it take four-quadrant bites of the ring with a cold biopsy forceps       to additionally disrupted. This was done without difficulty or apparent       complication.      . Impression:               Noncritical appearing Schatzki's ring. Hiatal                            hernia. Otherwise, upper  GI tract appeared normal.                           Status post serial Maloney dilations up to 60                            [place]. Status post biopsy forcep disruption of the                            ring.                           - No specimens collected. If no improvement in                            dysphagia, would consider esophageal manometry as a                            next step in the evaluation. Moderate Sedation:      Moderate (conscious)  sedation was personally administered by an       anesthesia professional. The following parameters were monitored: oxygen       saturation, heart rate, blood pressure, respiratory rate, EKG, adequacy       of pulmonary ventilation, and response to care. Recommendation:           - Patient has a contact number available for                            emergencies. The signs and symptoms of potential                            delayed complications were discussed with the                            patient. Return to normal activities tomorrow.                            Written discharge instructions were provided to the                            patient.                           - Advance diet as tolerated. Continue Nexium twice                            daily. See colonoscopy report office visit with [place]                            in 3 months Procedure Code(s):        --- Professional ---                           [phone], Esophagogastroduodenoscopy, flexible,  transoral; diagnostic, including collection of                            specimen(s) by brushing or washing, when performed                            (separate procedure) Diagnosis Code(s):        --- Professional ---                           R13.10, Dysphagia, unspecified                           R93.3, Abnormal findings on diagnostic imaging of                            other parts of digestive tract CPT copyright 2022 American Medical Association. All rights reserved. The codes documented in this report are preliminary and upon coder review may  be revised to meet current compliance requirements. [name]. Louan Base, MD Gennette Pac, MD 12/13/2021 8:59:92 PM This report has been signed electronically. Number of Addenda: 0

## 2021-12-13 NOTE — Interval H&P Note (Signed)
History and Physical Interval Note:  12/13/2021 7:33 AM  Erica Gallegos  has presented today for surgery, with the diagnosis of screening colonoscopy, dysphagia.  The various methods of treatment have been discussed with the patient and family. After consideration of risks, benefits and other options for treatment, the patient has consented to  Procedure(s) with comments: COLONOSCOPY WITH PROPOFOL (N/A) - 8:00am, asa 2 ESOPHAGOGASTRODUODENOSCOPY (EGD) WITH PROPOFOL (N/A) MALONEY DILATION (N/A) as a surgical intervention.  The patient's history has been reviewed, patient examined, no change in status, stable for surgery.  I have reviewed the patient's chart and labs.  Questions were answered to the patient's satisfaction.     Erica Gallegos   Patient without change.  High rescreening colonoscopy today per plan.  Abnormal barium pill esophagram-pill hung the GE junction.  History of Schatzki's ring.  I have offered the patient both an EGD with esophageal dilation as feasible/appropriate and a high risk screening colonoscopy today per plan. The risks, benefits, limitations, imponderables and alternatives regarding both EGD and colonoscopy have been reviewed with the patient. Questions have been answered. All parties agreeable.

## 2021-12-13 NOTE — Anesthesia Procedure Notes (Signed)
Date/Time: 12/13/2021 7:42 AM  Performed by: Cy Blamer, CRNAPre-anesthesia Checklist: Patient identified, Emergency Drugs available, Suction available, Patient being monitored and Timeout performed Patient Re-evaluated:Patient Re-evaluated prior to induction Oxygen Delivery Method: Nasal cannula Preoxygenation: Pre-oxygenation with 100% oxygen Induction Type: IV induction Placement Confirmation: positive ETCO2 Dental Injury: Teeth and Oropharynx as per pre-operative assessment

## 2021-12-13 NOTE — Transfer of Care (Signed)
Immediate Anesthesia Transfer of Care Note  Patient: EUGENIA ELDREDGE  Procedure(s) Performed: COLONOSCOPY WITH PROPOFOL ESOPHAGOGASTRODUODENOSCOPY (EGD) WITH PROPOFOL MALONEY DILATION  Patient Location: PACU  Anesthesia Type:General  Level of Consciousness: awake, alert , and oriented  Airway & Oxygen Therapy: Patient Spontanous Breathing  Post-op Assessment: Report given to RN, Post -op Vital signs reviewed and stable, Patient moving all extremities X 4, and Patient able to stick tongue midline  Post vital signs: Reviewed  Last Vitals:  Vitals Value Taken Time  BP 130/86 12/13/21 0837  Temp 97.0   Pulse 67 12/13/21 0838  Resp 16 12/13/21 0838  SpO2 95 % 12/13/21 0838  Vitals shown include unvalidated device data.  Last Pain:  Vitals:   12/13/21 0837  TempSrc:   PainSc: 0-No pain      Patients Stated Pain Goal: 5 (12/13/21 4128)  Complications: No notable events documented.

## 2021-12-13 NOTE — Anesthesia Preprocedure Evaluation (Signed)
Anesthesia Evaluation  Patient identified by MRN, date of birth, ID band Patient awake    Reviewed: Allergy & Precautions, H&P , NPO status , Patient's Chart, lab work & pertinent test results  History of Anesthesia Complications (+) PONV, AWARENESS UNDER ANESTHESIA and history of anesthetic complications  Airway Mallampati: II  TM Distance: >3 FB Neck ROM: Full    Dental  (+) Upper Dentures, Missing, Dental Advisory Given   Pulmonary sleep apnea and Continuous Positive Airway Pressure Ventilation , former smoker   Pulmonary exam normal breath sounds clear to auscultation       Cardiovascular Exercise Tolerance: Poor hypertension, Pt. on medications + Peripheral Vascular Disease (RAS)  Normal cardiovascular exam Rhythm:Regular Rate:Normal     Neuro/Psych  Headaches PSYCHIATRIC DISORDERS Anxiety Depression     Neuromuscular disease (SPINAL STENOSIS, B/L LE weakness)    GI/Hepatic Neg liver ROS,GERD  Medicated,,  Endo/Other  negative endocrine ROS    Renal/GU negative Renal ROS  negative genitourinary   Musculoskeletal  (+) Arthritis , Osteoarthritis,    Abdominal   Peds negative pediatric ROS (+)  Hematology negative hematology ROS (+)   Anesthesia Other Findings   Reproductive/Obstetrics negative OB ROS                             Anesthesia Physical Anesthesia Plan  ASA: 3  Anesthesia Plan: General   Post-op Pain Management: Minimal or no pain anticipated   Induction: Intravenous  PONV Risk Score and Plan: 1 and Propofol infusion  Airway Management Planned: Natural Airway and Nasal Cannula  Additional Equipment:   Intra-op Plan:   Post-operative Plan:   Informed Consent: I have reviewed the patients History and Physical, chart, labs and discussed the procedure including the risks, benefits and alternatives for the proposed anesthesia with the patient or authorized  representative who has indicated his/her understanding and acceptance.     Dental advisory given  Plan Discussed with: CRNA and Surgeon  Anesthesia Plan Comments:        Anesthesia Quick Evaluation

## 2021-12-13 NOTE — Discharge Instructions (Addendum)
Colonoscopy Discharge Instructions  Read the instructions outlined below and refer to this sheet in the next few weeks. These discharge instructions provide you with general information on caring for yourself after you leave the hospital. Your doctor may also give you specific instructions. While your treatment has been planned according to the most current medical practices available, unavoidable complications occasionally occur. If you have any problems or questions after discharge, call Dr. Gala Romney at [phone]. ACTIVITY You may resume your regular activity, but move at a slower pace for the next 24 hours.  Take frequent rest periods for the next 24 hours.  Walking will help get rid of the air and reduce the bloated feeling in your belly (abdomen).  No driving for 24 hours (because of the medicine (anesthesia) used during the test).   Do not sign any important legal documents or operate any machinery for 24 hours (because of the anesthesia used during the test).  NUTRITION Drink plenty of fluids.  You may resume your normal diet as instructed by your doctor.  Begin with a light meal and progress to your normal diet. Heavy or fried foods are harder to digest and may make you feel sick to your stomach (nauseated).  Avoid alcoholic beverages for 24 hours or as instructed.  MEDICATIONS You may resume your normal medications unless your doctor tells you otherwise.  WHAT YOU CAN EXPECT TODAY Some feelings of bloating in the abdomen.  Passage of more gas than usual.  Spotting of blood in your stool or on the toilet paper.  IF YOU HAD POLYPS REMOVED DURING THE COLONOSCOPY: No aspirin products for 7 days or as instructed.  No alcohol for 7 days or as instructed.  Eat a soft diet for the next 24 hours.  FINDING OUT THE RESULTS OF YOUR TEST Not all test results are available during your visit. If your test results are not back during the visit, make an appointment with your caregiver to find out the  results. Do not assume everything is normal if you have not heard from your caregiver or the medical facility. It is important for you to follow up on all of your test results.  SEEK IMMEDIATE MEDICAL ATTENTION IF: You have more than a spotting of blood in your stool.  Your belly is swollen (abdominal distention).  You are nauseated or vomiting.  You have a temperature over 101.  You have abdominal pain or discomfort that is severe or gets worse throughout the day.   EGD Discharge instructions Please read the instructions outlined below and refer to this sheet in the next few weeks. These discharge instructions provide you with general information on caring for yourself after you leave the hospital. Your doctor may also give you specific instructions. While your treatment has been planned according to the most current medical practices available, unavoidable complications occasionally occur. If you have any problems or questions after discharge, please call your doctor. ACTIVITY You may resume your regular activity but move at a slower pace for the next 24 hours.  Take frequent rest periods for the next 24 hours.  Walking will help expel (get rid of) the air and reduce the bloated feeling in your abdomen.  No driving for 24 hours (because of the anesthesia (medicine) used during the test).  You may shower.  Do not sign any important legal documents or operate any machinery for 24 hours (because of the anesthesia used during the test).  NUTRITION Drink plenty of fluids.  You may  resume your normal diet.  Begin with a light meal and progress to your normal diet.  Avoid alcoholic beverages for 24 hours or as instructed by your caregiver.  MEDICATIONS You may resume your normal medications unless your caregiver tells you otherwise.  WHAT YOU CAN EXPECT TODAY You may experience abdominal discomfort such as a feeling of fullness or "gas" pains.  FOLLOW-UP Your doctor will discuss the results of  your test with you.  SEEK IMMEDIATE MEDICAL ATTENTION IF ANY OF THE FOLLOWING OCCUR: Excessive nausea (feeling sick to your stomach) and/or vomiting.  Severe abdominal pain and distention (swelling).  Trouble swallowing.  Temperature over 101 F (37.8 C).  Rectal bleeding or vomiting of blood.    No polyps found in your colon.  You should return for repeat colonoscopy in 5 years  Your esophagus was maximally dilated today.  No new findings.  If your swallowing does not improve from today's maneuver (hopefully, it will) you may need pressure studies of your esophagus i.e. esophageal manometry   office visit with [place] in 3 months    OFFICE WILL CONTACT AND YOU CAN ALSO CHECK MYCHART FOR INFORMATION    At patient request, I called Governor Specking at [phone] -  rolled to voicemail.  The

## 2021-12-19 ENCOUNTER — Encounter (HOSPITAL_COMMUNITY): Payer: Self-pay | Admitting: Internal Medicine

## 2021-12-26 ENCOUNTER — Ambulatory Visit (INDEPENDENT_AMBULATORY_CARE_PROVIDER_SITE_OTHER): Payer: BC Managed Care – PPO | Admitting: "Endocrinology

## 2021-12-26 ENCOUNTER — Encounter: Payer: Self-pay | Admitting: "Endocrinology

## 2021-12-26 VITALS — BP 122/82 | HR 56 | Ht 66.0 in | Wt 157.0 lb

## 2021-12-26 DIAGNOSIS — R7989 Other specified abnormal findings of blood chemistry: Secondary | ICD-10-CM

## 2021-12-26 DIAGNOSIS — M85852 Other specified disorders of bone density and structure, left thigh: Secondary | ICD-10-CM | POA: Diagnosis not present

## 2021-12-26 MED ORDER — ALENDRONATE SODIUM 70 MG PO TABS: 70.0000 mg | ORAL_TABLET | ORAL | 3 refills | Status: DC

## 2021-12-26 NOTE — Progress Notes (Unsigned)
Endocrinology Consult Note                                            12/26/2021, 1:23 PM   Subjective:    Patient ID: Erica Gallegos, female    DOB: [birth year], PCP Johnette Abraham, MD   Past Medical History:  Diagnosis Date   Anxiety    Arthritis    Complication of anesthesia    Patient woke up during anesthesia with bunionectomy   GERD (gastroesophageal reflux disease)    HLD (hyperlipidemia)    HTN (hypertension)    Major depressive disorder    Migraine with aura    PONV (postoperative nausea and vomiting)    RAS (renal artery stenosis) ([place])    Sleep apnea    Sleep related hypoxia    Spinal stenosis    Past Surgical History:  Procedure Laterality Date   BUNIONECTOMY     CATARACT EXTRACTION     CHOLECYSTECTOMY     COLONOSCOPY WITH PROPOFOL N/A 12/13/2021   Procedure: COLONOSCOPY WITH PROPOFOL;  Surgeon: Daneil Dolin, MD;  [facility]: [facility];  Service: Endoscopy;  Laterality: N/A;  8:00am, asa 2   ESOPHAGOGASTRODUODENOSCOPY (EGD) WITH PROPOFOL N/A 04/05/2021   Procedure: ESOPHAGOGASTRODUODENOSCOPY (EGD) WITH PROPOFOL;  Surgeon: Daneil Dolin, MD;  Location: [facility];  Service: Endoscopy;  Laterality: N/A;  10:15am   ESOPHAGOGASTRODUODENOSCOPY (EGD) WITH PROPOFOL N/A 12/13/2021   Procedure: ESOPHAGOGASTRODUODENOSCOPY (EGD) WITH PROPOFOL;  Surgeon: Daneil Dolin, MD;  [facility];  Service: Endoscopy;  Laterality: N/A;   MALONEY DILATION N/A 04/05/2021   Procedure: Venia Minks DILATION;  Surgeon: Daneil Dolin, MD;  [facility]: [facility];  Service: Endoscopy;  Laterality: N/A;   MALONEY DILATION N/A 12/13/2021   Procedure: Venia Minks DILATION;  Surgeon: Daneil Dolin, MD;  Location: [facility];  Service: Endoscopy;  Laterality: N/A;   Social History   Socioeconomic History   Marital status: Married    Spouse name: Not on file   Number of children: 3   Years of education: Not on file   Highest education level: Not on file   Occupational History   Not on file  Tobacco Use   Smoking status: Former    Types: Cigarettes   Smokeless tobacco: Never   Tobacco comments:    She quit smoking in the early 90's  Vaping Use   Vaping Use: Every day  Substance and Sexual Activity   Alcohol use: Yes    Comment: seldom   Drug use: Yes    Types: Marijuana    Comment: CBD cream; Marijuana a couple times a week. helps with her anxiety and migraines.   Sexual activity: Yes    Birth control/protection: None, Post-menopausal  Other Topics Concern   Not on file  Social History Narrative   Lives with her husband, retired.    Social Determinants of Health   Financial Resource Strain: Unknown (08/28/2020)   Overall Financial Resource Strain (CARDIA)    Difficulty of Paying Living Expenses: Patient refused  Food Insecurity: Unknown (08/28/2020)   Hunger Vital Sign    Worried About Running Out of Food in the Last Year: Patient refused    Chancellor in the Last Year: Patient refused  Transportation Needs: Unknown (08/28/2020)   PRAPARE - Transportation    Lack  of Transportation (Medical): Patient refused    Lack of Transportation (Non-Medical): Patient refused  Physical Activity: Unknown (08/28/2020)   Exercise Vital Sign    Days of Exercise per Week: Patient refused    Minutes of Exercise per Session: Patient refused  Stress: Unknown (08/28/2020)   [place]    Feeling of Stress : Patient refused  Social Connections: Unknown (08/28/2020)   Social Connection and Isolation Panel [NHANES]    Frequency of Communication with Friends and Family: Patient refused    Frequency of Social Gatherings with Friends and Family: Patient refused    Attends Religious Services: Patient refused    Marine scientist or Organizations: Patient refused    Attends Music therapist: Patient refused    Marital Status: Patient refused   Family History   Problem Relation Age of Onset   Cancer Mother    Hypertension Mother    Diabetes Mother    Heart disease Mother    Hyperlipidemia Mother    Cancer Father    Stroke Father    Hypertension Father    Heart disease Father    Hypertension Sister    Lupus Sister    Hypertension Sister    Cancer Brother    Brain cancer Brother    Non-Hodgkin's lymphoma Brother    Colon cancer Neg Hx    Outpatient Encounter Medications as of 12/26/2021  Medication Sig   alendronate (FOSAMAX) 70 MG tablet Take 1 tablet (70 mg total) by mouth every 7 (seven) days. Take with a full glass of water on an empty stomach.   atorvastatin (LIPITOR) 40 MG tablet Take 1 tablet (40 mg total) by mouth daily.   chlorthalidone (HYGROTON) 25 MG tablet TAKE 1 AND 1/2 TABLETS(37.5 MG) BY MOUTH DAILY   Cholecalciferol (VITAMIN D3) 50 MCG (2000 UT) TABS Take 2,000 Units by mouth daily.   clonazePAM (KLONOPIN) 0.5 MG tablet Take 0.5 mg by mouth daily as needed for anxiety (Sleep).   vitamin B-12 (CYANOCOBALAMIN) 500 MCG tablet Take 500 mcg by mouth daily.   vitamin C (ASCORBIC ACID) 500 MG tablet Take 500 mg by mouth daily.   buPROPion (WELLBUTRIN SR) 200 MG 12 hr tablet Take 200 mg by mouth 2 (two) times daily.   Calcium Carb-Cholecalciferol (CALTRATE 600+D3) 600-20 MG-MCG TABS Take 600 mg by mouth daily. (Patient taking differently: Take 1 tablet by mouth daily.)   carvedilol (COREG) 12.5 MG tablet TAKE 1 TABLET(12.5 MG) BY MOUTH TWICE DAILY   cyclobenzaprine (FLEXERIL) 10 MG tablet TAKE 1 TABLET BY MOUTH EVERY NIGHT AT BEDTIME AS NEEDED FOR BACK PAIN OR NERVES   diazepam (VALIUM) 5 MG tablet Take 5 mg by mouth as needed (only take when get injection in her back). (Patient not taking: Reported on 12/26/2021)   diclofenac (VOLTAREN) 50 MG EC tablet Take 50 mg by mouth 2 (two) times daily as needed for mild pain or moderate pain.   DULoxetine (CYMBALTA) 60 MG capsule Take 60 mg by mouth 2 (two) times daily.   ezetimibe (ZETIA)  10 MG tablet Take 1 tablet (10 mg total) by mouth daily.   gabapentin (NEURONTIN) 300 MG capsule 300 mg in the morning and 600 mg at bedtime additional 300 mg if needed during the day (Patient taking differently: Take 300-600 mg by mouth See admin instructions. 300 mg in the morning and 600 mg at bedtime additional 300 mg if needed during the day)   hydrALAZINE (APRESOLINE)  100 MG tablet Take 1 tablet (100 mg total) by mouth 2 (two) times daily.   lamoTRIgine (LAMICTAL) 25 MG tablet Take 2 tablets (50 mg total) by mouth daily. (Patient taking differently: Take 25-50 mg by mouth See admin instructions. Take 25 mg in the morning and 50 mg at bedtime)   lansoprazole (PREVACID) 30 MG capsule TAKE 1 CAPSULE(30 MG) BY MOUTH TWICE DAILY BEFORE A MEAL   lisinopril (ZESTRIL) 40 MG tablet Take 1 tablet (40 mg total) by mouth daily.   Melatonin 10 MG TABS Take 10 mg by mouth at bedtime.   Omega-3 Fatty Acids (FISH OIL) 1000 MG CAPS Take 4,000 mg by mouth daily.   spironolactone (ALDACTONE) 25 MG tablet TAKE 1 TABLET(25 MG) BY MOUTH DAILY   No facility-administered encounter medications on file as of 12/26/2021.   ALLERGIES: Allergies  Allergen Reactions   Atenolol Swelling   Other Other (See Comments)    Do not eat meat   Amlodipine Swelling    VACCINATION STATUS: Immunization History  Administered Date(s) Administered   Influenza,inj,Quad PF,6+ Mos 10/13/2020, 10/22/2021   Moderna Covid-19 Vaccine Bivalent Booster 65yrs & up 07/07/2020, 10/16/2020   Moderna SARS-COV2 Booster Vaccination 07/28/2019   Moderna Sars-Covid-2 Vaccination 11/23/2019   PFIZER(Purple Top)SARS-COV-2 Vaccination 03/12/2019, 04/02/2019   Tdap 07/24/2021   Zoster Recombinat (Shingrix) 07/24/2021, 10/22/2021    HPI Erica Gallegos is 66 y.o. female who presents today with a medical history as above. she is being seen in consultation for osteopenia requested by Erica Lade, MD.  History is obtained from the  patient.  She was never diagnosed with osteoporosis.  Her most recent bone density done in October 2023 showed a T-score of -2.3 on her hip, -1.7 on femur, and -1.2 on forearms.  Her spine was not included in the study due to degenerative joint disease. Patient thinks she has lost some height, does have medical history of scoliosis.  She is on vitamin D and calcium supplements.  She denies any history of fragility fractures.  Her medical problems include hypertension and hyperlipidemia on treatment. Her intake of dairy products is out of average.  She was diffuse body aches and neuropathy/radiculopathies.  She is more than 15 years postmenopausal, a former smoker.   Review of Systems  Constitutional: + Minimally fluctuating body weight, + fatigue, no subjective hyperthermia, no subjective hypothermia Eyes: no blurry vision, no xerophthalmia ENT: no sore throat, no nodules palpated in throat, no dysphagia/odynophagia, no hoarseness Cardiovascular: no Chest Pain, no Shortness of Breath, no palpitations, no leg swelling Respiratory: no cough, no shortness of breath Gastrointestinal: no Nausea/Vomiting/Diarhhea Musculoskeletal: no muscle/joint aches Skin: no rashes Neurological: no tremors, no numbness, no tingling, no dizziness Psychiatric: no depression, no anxiety  Objective:       12/26/2021   10:36 AM 12/13/2021    8:41 AM 12/13/2021    8:37 AM  Vitals with BMI  Height 5\' 6"     Weight 157 lbs    BMI 25.35    Systolic 122 128  Diastolic 82 95 86  Pulse 56 65 70    BP 122/82   Pulse (!) 56   Ht 5\' 6"  (1.676 m)   Wt 157 lb (71.2 kg)   BMI 25.34 kg/m   Wt Readings from Last 3 Encounters:  12/26/21 157 lb (71.2 kg)  12/13/21 153 lb (69.4 kg)  11/13/21 153 lb 6.4 oz (69.6 kg)    Physical Exam  Constitutional:  Body mass index is 25.34  kg/m.,  not in acute distress, normal state of mind Eyes: PERRLA, EOMI, no exophthalmos ENT: moist mucous membranes, no gross  thyromegaly, no gross cervical lymphadenopathy Cardiovascular: normal precordial activity, Regular Rate and Rhythm, no Murmur/Rubs/Gallops Respiratory:  adequate breathing efforts, no gross chest deformity, Clear to auscultation bilaterally Gastrointestinal: abdomen soft, Non -tender, No distension, Bowel Sounds present, no gross organomegaly Musculoskeletal: + Mild scoliotic deformity of the spine which was not included in the bone density , strength intact in all four extremities Skin: moist, warm, no rashes Neurological: no tremor with outstretched hands, Deep tendon reflexes normal in bilateral lower extremities.  CMP ( most recent) CMP     Component Value Date/Time   NA 139 12/04/2021 1106   K 4.9 12/04/2021 1106   CL 112 (H) 12/04/2021 1106   CO2 24 12/04/2021 1106   GLUCOSE 82 12/04/2021 1106   BUN 27 (H) 12/04/2021 1106   CREATININE 1.61 (H) 12/04/2021 1106   CREATININE 1.28 (H) 08/10/2021 1209   CALCIUM 9.3 12/04/2021 1106   PROT 6.8 10/03/2021 1034   AST 17 10/03/2021 1034   ALT 27 10/03/2021 1034   BILITOT 0.7 10/03/2021 1034   GFRNONAA 36 (L) 12/04/2021 1106      Lipid Panel ( most recent) Lipid Panel     Component Value Date/Time   CHOL 136 08/10/2021 1209   TRIG 92 08/10/2021 1209   HDL 62 08/10/2021 1209   CHOLHDL 2.2 08/10/2021 1209   LDLCALC 56 08/10/2021 1209      Lab Results  Component Value Date   TSH 1.260 10/22/2021   TSH 1.45 09/21/2020   TSH 1.93 08/28/2020   FREET4 0.74 (L) 10/22/2021      Assessment & Plan:   1. Osteopenia of neck of left femur 2. Abnormal thyroid blood test -   - Erica Gallegos  is being seen at a kind request of Doren Custard Hazle Nordmann, MD. - I have reviewed her available  records and clinically evaluated the patient. - Based on these reviews, she has osteopenia with significant decrease in bone mass to a T-score of -2.3 on left hip. -She has mild scoliotic deformity, loss of height, denies fragility  fractures. Considering her Gallegos, she may benefit from early initiation of bisphosphonates.  I discussed and initiated Fosamax 70 mg p.o. weekly with plan to repeat her bone density in October 2025. She also was found to have mildly decreased T4 with normal TSH.  She will have repeat full set thyroid function test during her next visit. Fall precautions discussed with her.  Her dietary intake of protein seems to be adequate.     her next labs will also include PTH/calcium, magnesium, phosphorus, as well as 25-hydroxy vitamin D.  - she is advised to maintain close follow up with Doren Custard, Hazle Nordmann, MD for primary care needs.   - Time spent with the patient: 50 minutes, of which >50% was spent in  counseling her about her osteopenia, and the rest in obtaining information about her symptoms, reviewing her previous labs/studies ( including abstractions from other facilities),  evaluations, and treatments,  and developing a plan to confirm diagnosis and long term treatment based on the latest standards of care/guidelines; and documenting her care.  Erica Gallegos participated in the discussions, expressed understanding, and voiced agreement with the above plans.  All questions were answered to her satisfaction. she is encouraged to contact clinic should she have any questions or concerns prior to her return visit.  Follow  up plan: Return in about 6 months (around 06/27/2022) for F/U with Pre-visit Labs.   Glade Lloyd, MD [facility] [address] [place], [place] [address] Phone: [phone]  Fax: [phone]     12/26/2021, 1:23 PM  This note was partially dictated with voice recognition software. Similar sounding words can be transcribed inadequately or may not  be corrected upon review.

## 2021-12-27 DIAGNOSIS — M48062 Spinal stenosis, lumbar region with neurogenic claudication: Secondary | ICD-10-CM | POA: Diagnosis not present

## 2022-01-02 DIAGNOSIS — F4310 Post-traumatic stress disorder, unspecified: Secondary | ICD-10-CM | POA: Diagnosis not present

## 2022-01-14 ENCOUNTER — Other Ambulatory Visit: Payer: Self-pay | Admitting: Internal Medicine

## 2022-01-14 ENCOUNTER — Other Ambulatory Visit: Payer: Self-pay | Admitting: Nurse Practitioner

## 2022-01-15 ENCOUNTER — Ambulatory Visit: Payer: BC Managed Care – PPO | Attending: Nurse Practitioner | Admitting: Nurse Practitioner

## 2022-01-15 ENCOUNTER — Encounter: Payer: Self-pay | Admitting: Nurse Practitioner

## 2022-01-15 VITALS — BP 139/80 | HR 52 | Wt 158.0 lb

## 2022-01-15 DIAGNOSIS — E785 Hyperlipidemia, unspecified: Secondary | ICD-10-CM

## 2022-01-15 DIAGNOSIS — I1A0 Resistant hypertension: Secondary | ICD-10-CM

## 2022-01-15 DIAGNOSIS — R001 Bradycardia, unspecified: Secondary | ICD-10-CM | POA: Diagnosis not present

## 2022-01-15 DIAGNOSIS — G4733 Obstructive sleep apnea (adult) (pediatric): Secondary | ICD-10-CM | POA: Diagnosis not present

## 2022-01-15 NOTE — Progress Notes (Signed)
Cardiology Office Note:    Date:  01/15/2022   ID:  Erica Gallegos, DOB [birth year], MRN [id number]  PCP:  Billie Lade, MD   [facility] Providers Cardiologist:  Dina Rich, MD     Referring MD: Billie Lade, MD   CC: Here for follow-up  History of Present Illness:    Erica Gallegos is a 67 y.o. female with a hx of the following:  History of bradycardia Resistant hypertension Hyperlipidemia GERD Renal artery stenosis Sleep apnea Major depressive disorder   Patient is a delightful 67 year old female with past medical history as mentioned above.  First referred to Dr. Dina Rich in 2022.  Underwent repeat renal artery ultrasound for workup for secondary causes of HTN was negative for any significant blockages in the arteries of the kidneys.  Pulmonology referral was placed for her history of sleep apnea.  HCTZ was stopped and switched to chlorthalidone.  Normal renin/Aldo levels.   Last seen by Dr. Levora Angel 1 year ago.  Home blood pressures were better controlled.  Was compliant with CPAP usage.   Today she presents for follow-up.  She states that she is doing well. Using CPAP every night, thought she believes her mask does not fit her appropriately, looking to get a new mask. Overall doing well from a cardiac perspective. Denies any CP, SHOB, palp lesions, syncope, presyncope, dizziness, orthopnea, PND, swelling or significant weight changes, acute bleeding, claudication.  Tolerating medications well.  She does note some musculoskeletal issues, does have chronic back issues and is looking at possible surgery in the future.  She is retired Engineer, structural.  SBP at home is averaging 140s.  Denies any other questions or concerns today.   Past Medical History:  Diagnosis Date   Anxiety    Arthritis    Complication of anesthesia    Patient woke up during anesthesia with bunionectomy   GERD (gastroesophageal reflux disease)    HLD  (hyperlipidemia)    HTN (hypertension)    Major depressive disorder    Migraine with aura    PONV (postoperative nausea and vomiting)    RAS (renal artery stenosis) (HCC)    Sleep apnea    Sleep related hypoxia    Spinal stenosis     Past Surgical History:  Procedure Laterality Date   BUNIONECTOMY     CATARACT EXTRACTION     CHOLECYSTECTOMY     COLONOSCOPY WITH PROPOFOL N/A 12/13/2021   Procedure: COLONOSCOPY WITH PROPOFOL;  Surgeon: Corbin Ade, MD;  [facility];  Service: Endoscopy;  Laterality: N/A;  8:00am, asa 2   ESOPHAGOGASTRODUODENOSCOPY (EGD) WITH PROPOFOL N/A 04/05/2021   Procedure: ESOPHAGOGASTRODUODENOSCOPY (EGD) WITH PROPOFOL;  Surgeon: Corbin Ade, MD;  [facility];  Service: Endoscopy;  Laterality: N/A;  10:15am   ESOPHAGOGASTRODUODENOSCOPY (EGD) WITH PROPOFOL N/A 12/13/2021   Procedure: ESOPHAGOGASTRODUODENOSCOPY (EGD) WITH PROPOFOL;  Surgeon: Corbin Ade, MD;  [facility];  Service: Endoscopy;  Laterality: N/A;   MALONEY DILATION N/A 04/05/2021   Procedure: Elease Hashimoto DILATION;  Surgeon: Corbin Ade, MD;  [facility];  Service: Endoscopy;  Laterality: N/A;   MALONEY DILATION N/A 12/13/2021   Procedure: Elease Hashimoto DILATION;  Surgeon: Corbin Ade, MD;  [facility]: [facility];  Service: Endoscopy;  Laterality: N/A;    Current Medications: Current Meds  Medication Sig   alendronate (FOSAMAX) 70 MG tablet Take 1 tablet (70 mg total) by mouth every 7 (seven) days. Take  with a full glass of water on an empty stomach.   atorvastatin (LIPITOR) 40 MG tablet Take 1 tablet (40 mg total) by mouth daily.   buPROPion (WELLBUTRIN SR) 200 MG 12 hr tablet Take 200 mg by mouth 2 (two) times daily.   Calcium Carb-Cholecalciferol (CALTRATE 600+D3) 600-20 MG-MCG TABS Take 600 mg by mouth daily. (Patient taking differently: Take 1 tablet by mouth daily.)   carvedilol (COREG) 12.5 MG tablet TAKE 1 TABLET(12.5 MG) BY MOUTH TWICE  DAILY   chlorthalidone (HYGROTON) 25 MG tablet TAKE 1 AND 1/2 TABLETS(37.5 MG) BY MOUTH DAILY   Cholecalciferol (VITAMIN D3) 50 MCG (2000 UT) TABS Take 2,000 Units by mouth daily.   clonazePAM (KLONOPIN) 0.5 MG tablet Take 0.5 mg by mouth daily as needed for anxiety (Sleep).   cyclobenzaprine (FLEXERIL) 10 MG tablet TAKE 1 TABLET BY MOUTH EVERY NIGHT AT BEDTIME AS NEEDED FOR BACK PAIN OR NERVES   diazepam (VALIUM) 5 MG tablet Take 5 mg by mouth as needed (only take when get injection in her back).   diclofenac (VOLTAREN) 50 MG EC tablet Take 50 mg by mouth 2 (two) times daily as needed for mild pain or moderate pain.   DULoxetine (CYMBALTA) 60 MG capsule Take 60 mg by mouth 2 (two) times daily.   ezetimibe (ZETIA) 10 MG tablet TAKE 1 TABLET(10 MG) BY MOUTH DAILY   gabapentin (NEURONTIN) 300 MG capsule 300 mg in the morning and 600 mg at bedtime additional 300 mg if needed during the day (Patient taking differently: Take 300-600 mg by mouth See admin instructions. 300 mg in the morning and 600 mg at bedtime additional 300 mg if needed during the day)   hydrALAZINE (APRESOLINE) 100 MG tablet TAKE 1 TABLET(100 MG) BY MOUTH TWICE DAILY   lamoTRIgine (LAMICTAL) 25 MG tablet Take 2 tablets (50 mg total) by mouth daily. (Patient taking differently: Take 25-50 mg by mouth See admin instructions. Take 25 mg in the morning and 50 mg at bedtime)   lansoprazole (PREVACID) 30 MG capsule TAKE 1 CAPSULE(30 MG) BY MOUTH TWICE DAILY BEFORE A MEAL   lisinopril (ZESTRIL) 40 MG tablet Take 1 tablet (40 mg total) by mouth daily.   Melatonin 10 MG TABS Take 10 mg by mouth at bedtime.   Omega-3 Fatty Acids (FISH OIL) 1000 MG CAPS Take 4,000 mg by mouth daily.   spironolactone (ALDACTONE) 25 MG tablet TAKE 1 TABLET(25 MG) BY MOUTH DAILY   vitamin B-12 (CYANOCOBALAMIN) 500 MCG tablet Take 500 mcg by mouth daily.   vitamin C (ASCORBIC ACID) 500 MG tablet Take 500 mg by mouth daily.     Allergies:   Atenolol, Other, and  Amlodipine   Social History   Socioeconomic History   Marital status: Married    Spouse name: Not on file   Number of children: 3   Years of education: Not on file   Highest education level: Not on file  Occupational History   Not on file  Tobacco Use   Smoking status: Former    Types: Cigarettes   Smokeless tobacco: Never   Tobacco comments:    She quit smoking in the early 90's  Vaping Use   Vaping Use: Every day  Substance and Sexual Activity   Alcohol use: Yes    Comment: seldom   Drug use: Yes    Types: Marijuana    Comment: CBD cream; Marijuana a couple times a week. helps with her anxiety and migraines.   Sexual activity: Yes  Birth control/protection: None, Post-menopausal  Other Topics Concern   Not on file  Social History Narrative   Lives with her husband, retired.    Social Determinants of Health   Financial Resource Strain: Unknown (08/28/2020)   Overall Financial Resource Strain (CARDIA)    Difficulty of Paying Living Expenses: Patient refused  Food Insecurity: Unknown (08/28/2020)   Hunger Vital Sign    Worried About Running Out of Food in the Last Year: Patient refused    Ran Out of Food in the Last Year: Patient refused  Transportation Needs: Unknown (08/28/2020)   PRAPARE - Transportation    Lack of Transportation (Medical): Patient refused    Lack of Transportation (Non-Medical): Patient refused  Physical Activity: Unknown (08/28/2020)   Exercise Vital Sign    Days of Exercise per Week: Patient refused    Minutes of Exercise per Session: Patient refused  Stress: Unknown (08/28/2020)   [employer] of Occupational Health - Occupational Stress Questionnaire    Feeling of Stress : Patient refused  Social Connections: Unknown (08/28/2020)   Social Connection and Isolation Panel [NHANES]    Frequency of Communication with Friends and Family: Patient refused    Frequency of Social Gatherings with Friends and Family: Patient refused    Attends  Religious Services: Patient refused    Database administrator or Organizations: Patient refused    Attends Engineer, structural: Patient refused    Marital Status: Patient refused     Family History: The patient's family history includes Brain cancer in her brother; Cancer in her brother, father, and mother; Diabetes in her mother; Heart disease in her father and mother; Hyperlipidemia in her mother; Hypertension in her father, mother, sister, and sister; Lupus in her sister; Non-Hodgkin's lymphoma in her brother; Stroke in her father. There is no history of Colon cancer.  ROS:   Review of Systems  Constitutional: Negative.   HENT: Negative.    Eyes: Negative.   Respiratory: Negative.    Cardiovascular: Negative.   Gastrointestinal: Negative.   Genitourinary: Negative.   Musculoskeletal:  Positive for back pain. Negative for falls, joint pain, myalgias and neck pain.  Skin: Negative.   Neurological:  Positive for weakness. Negative for dizziness, tingling, tremors, sensory change, speech change, focal weakness, seizures, loss of consciousness and headaches.  Endo/Heme/Allergies: Negative.   Psychiatric/Behavioral:  Negative for depression, hallucinations, memory loss, substance abuse and suicidal ideas. The patient is not nervous/anxious and does not have insomnia.     Please see the history of present illness.    All other systems reviewed and are negative.  EKGs/Labs/Other Studies Reviewed:    The following studies were reviewed today:   EKG:  EKG is ordered today.  The ekg ordered today demonstrates sinus bradycardia, 52 bpm, without acute ischemic changes.   Renal artery duplex on August 31, 2020: Summary:  Renal:    Right: Normal size right kidney. Normal right Resisitive Index. No         evidence of right renal artery stenosis. RRV flow present.  Left:  Normal size of left kidney. Normal left Resistive Index.         1-59% stenosis of the left renal artery  with renal-aortic         ratio of 3.22. LRV flow present. Cyst(s) noted.  Recent Labs: 10/03/2021: ALT 27 10/22/2021: TSH 1.260 12/04/2021: BUN 27; Creatinine, Ser 1.61; Potassium 4.9; Sodium 139  Recent Lipid Panel    Component Value Date/Time   CHOL  136 08/10/2021 1209   TRIG 92 08/10/2021 1209   HDL 62 08/10/2021 1209   CHOLHDL 2.2 08/10/2021 1209   LDLCALC 56 08/10/2021 1209   Physical Exam:    VS:  BP 139/80 (BP Location: Left Arm, Patient Position: Sitting, Cuff Size: Normal)   Pulse (!) 52   Wt 158 lb (71.7 kg)   SpO2 99%   BMI 25.50 kg/m     Wt Readings from Last 3 Encounters:  01/15/22 158 lb (71.7 kg)  12/26/21 157 lb (71.2 kg)  12/13/21 153 lb (69.4 kg)     GEN: Well nourished, well developed in no acute distress HEENT: Normal NECK: No JVD; No carotid bruits CARDIAC: S1/S2, regular rhythm and slow rate, no murmurs, rubs, gallops; 2+ pulses throughout RESPIRATORY:  Clear to auscultation without rales, wheezing or rhonchi  MUSCULOSKELETAL:  No edema; No deformity  SKIN: Warm and dry NEUROLOGIC:  Alert and oriented x 3 PSYCHIATRIC:  Normal affect   ASSESSMENT:    1. Resistant hypertension   2. Bradycardia   3. Hyperlipidemia, unspecified hyperlipidemia type   4. OSA on CPAP    PLAN:    In order of problems listed above:  Resistant hypertension Blood pressure on arrival 154/82, repeat BP 139/80.  States BP is well-controlled at home, SBP averaging 140s. Discussed with her that goal SBP < 130. Discussed to monitor BP at home at least 2 hours after medications and sitting for 5-10 minutes.  Continue current medication regimen.  She will contact us if her BP remains elevated. Heart healthy diet and regular cardiovascular exercise encouraged.   Bradycardia Heart rate today is 52.  She is asymptomatic with this.  Tolerating her medications well.  Continue current medication regimen. Heart healthy diet and regular cardiovascular exercise encouraged.    HLD Lipid profile from August 23 revealed total cholesterol 136, HDL 62, LDL 56, triglycerides 92.  She is currently at goal.  Continue atorvastatin, Zetia, and omega-3 fatty acids. Heart healthy diet and regular cardiovascular exercise encouraged.   OSA on CPAP Compliant with CPAP usage, however mask is not fitting her appropriately according to her report.  Will route this note to Claiborne Rigg, CMA to reach out to patient about a new mask.   5. Disposition: Follow up with Dr. Dina Rich in 6 months or sooner if anything changes.       Medication Adjustments/Labs and Tests Ordered: Current medicines are reviewed at length with the patient today.  Concerns regarding medicines are outlined above.  Orders Placed This Encounter  Procedures   EKG 12-Lead   No orders of the defined types were placed in this encounter.   Patient Instructions  Medication Instructions:  Your physician recommends that you continue on your current medications as directed. Please refer to the Current Medication list given to you today.   Labwork: None today  Testing/Procedures: None today  Follow-Up: 6 months  Any Other Special Instructions Will Be Listed Below (If Applicable).  If you need a refill on your cardiac medications before your next appointment, please call your pharmacy.   Blood Pressure Record Sheet To take your blood pressure, you will need a blood pressure machine. You may be prescribed one, or you can buy a blood pressure machine (blood pressure monitor) at your clinic, drug store, or online. When choosing one, look for these features: An automatic monitor that has an arm cuff. A cuff that wraps snugly, but not too tightly, around your upper arm. You should be able  to fit only one finger between your arm and the cuff. A device that stores blood pressure reading results. Do not choose a monitor that measures your blood pressure from your wrist or finger. Follow your health  care provider's instructions for how to take your blood pressure. To use this form: Get one reading in the morning (a.m.) before you take any medicines. Get one reading in the evening (p.m.) before supper. Take at least two readings with each blood pressure check. This makes sure the results are correct. Wait 1-2 minutes between measurements. Write down the results in the spaces on this form. Repeat this once a week, or as told by your health care provider. Make a follow-up appointment with your health care provider to discuss the results. Blood pressure log Date: _______________________ a.m. _____________________(1st reading) _____________________(2nd reading) p.m. _____________________(1st reading) _____________________(2nd reading) Date: _______________________ a.m. _____________________(1st reading) _____________________(2nd reading) p.m. _____________________(1st reading) _____________________(2nd reading) Date: _______________________ a.m. _____________________(1st reading) _____________________(2nd reading) p.m. _____________________(1st reading) _____________________(2nd reading) Date: _______________________ a.m. _____________________(1st reading) _____________________(2nd reading) p.m. _____________________(1st reading) _____________________(2nd reading) Date: _______________________ a.m. _____________________(1st reading) _____________________(2nd reading) p.m. _____________________(1st reading) _____________________(2nd reading) This information is not intended to replace advice given to you by your health care provider. Make sure you discuss any questions you have with your health care provider. Document Revised: 09/07/2020 Document Reviewed: 09/07/2020 Elsevier Patient Education  [place], Finis Bud, NP  01/15/2022 10:03 AM    [place]

## 2022-01-15 NOTE — Patient Instructions (Signed)
Medication Instructions:  Your physician recommends that you continue on your current medications as directed. Please refer to the Current Medication list given to you today.   Labwork: None today  Testing/Procedures: None today  Follow-Up: 6 months  Any Other Special Instructions Will Be Listed Below (If Applicable).  If you need a refill on your cardiac medications before your next appointment, please call your pharmacy.   Blood Pressure Record Sheet To take your blood pressure, you will need a blood pressure machine. You may be prescribed one, or you can buy a blood pressure machine (blood pressure monitor) at your clinic, drug store, or online. When choosing one, look for these features: An automatic monitor that has an arm cuff. A cuff that wraps snugly, but not too tightly, around your upper arm. You should be able to fit only one finger between your arm and the cuff. A device that stores blood pressure reading results. Do not choose a monitor that measures your blood pressure from your wrist or finger. Follow your health care provider's instructions for how to take your blood pressure. To use this form: Get one reading in the morning (a.m.) before you take any medicines. Get one reading in the evening (p.m.) before supper. Take at least two readings with each blood pressure check. This makes sure the results are correct. Wait 1-2 minutes between measurements. Write down the results in the spaces on this form. Repeat this once a week, or as told by your health care provider. Make a follow-up appointment with your health care provider to discuss the results. Blood pressure log Date: _______________________ a.m. _____________________(1st reading) _____________________(2nd reading) p.m. _____________________(1st reading) _____________________(2nd reading) Date: _______________________ a.m. _____________________(1st reading) _____________________(2nd reading) p.m.  _____________________(1st reading) _____________________(2nd reading) Date: _______________________ a.m. _____________________(1st reading) _____________________(2nd reading) p.m. _____________________(1st reading) _____________________(2nd reading) Date: _______________________ a.m. _____________________(1st reading) _____________________(2nd reading) p.m. _____________________(1st reading) _____________________(2nd reading) Date: _______________________ a.m. _____________________(1st reading) _____________________(2nd reading) p.m. _____________________(1st reading) _____________________(2nd reading) This information is not intended to replace advice given to you by your health care provider. Make sure you discuss any questions you have with your health care provider. Document Revised: 09/07/2020 Document Reviewed: 09/07/2020 Elsevier Patient Education  [place].

## 2022-01-17 ENCOUNTER — Other Ambulatory Visit: Payer: Self-pay | Admitting: Internal Medicine

## 2022-01-22 ENCOUNTER — Telehealth: Payer: Self-pay | Admitting: *Deleted

## 2022-01-22 NOTE — Telephone Encounter (Signed)
I called this patient to see if I can assist her. She is a sleep patient of Dr Halford Chessman. She informed me that she is having a bit of a mask leak.I asked her what type of mask she has. She states that she has a medium ResMed F-20. I told her that if she has adjusted the straps as well as the other things like washing her face to help the seal, then she may need a mask refit, or a different mask altogether. I recommended she contact Dr Juanetta Gosling office to see what else he recommends or she can call Mariann Laster at [facility] to see if she will need a RX for a mask refit appointment. Patient agrees with the plan and will start by calling Mariann Laster at [facility] to see what she needs to do to come in for a mask fit appointment.

## 2022-01-22 NOTE — Telephone Encounter (Signed)
-----  Message from Finis Bud, NP sent at 01/15/2022 11:48 AM EST ----- Hello friend!   This wonderful patient is having difficulty with her mask and may need a new one. I know your the guru when it comes to CPAP equipment. I wanted to see if you get a chance to call her. She's very nice.   Thank you so much! I was singing your praises to this patient!   Best,  Finis Bud, NP

## 2022-01-27 ENCOUNTER — Other Ambulatory Visit: Payer: Self-pay | Admitting: Internal Medicine

## 2022-01-30 DIAGNOSIS — F4310 Post-traumatic stress disorder, unspecified: Secondary | ICD-10-CM | POA: Diagnosis not present

## 2022-02-05 ENCOUNTER — Other Ambulatory Visit: Payer: Self-pay | Admitting: Internal Medicine

## 2022-02-11 DIAGNOSIS — M47816 Spondylosis without myelopathy or radiculopathy, lumbar region: Secondary | ICD-10-CM | POA: Diagnosis not present

## 2022-02-11 DIAGNOSIS — Z6825 Body mass index (BMI) 25.0-25.9, adult: Secondary | ICD-10-CM | POA: Diagnosis not present

## 2022-02-11 DIAGNOSIS — M48062 Spinal stenosis, lumbar region with neurogenic claudication: Secondary | ICD-10-CM | POA: Diagnosis not present

## 2022-02-11 DIAGNOSIS — M4316 Spondylolisthesis, lumbar region: Secondary | ICD-10-CM | POA: Diagnosis not present

## 2022-02-26 ENCOUNTER — Encounter: Payer: Self-pay | Admitting: Internal Medicine

## 2022-02-26 ENCOUNTER — Ambulatory Visit: Payer: BC Managed Care – PPO | Admitting: Nurse Practitioner

## 2022-02-26 ENCOUNTER — Ambulatory Visit: Payer: BC Managed Care – PPO | Admitting: Internal Medicine

## 2022-02-26 VITALS — BP 136/71 | HR 74 | Ht 66.0 in | Wt 151.6 lb

## 2022-02-26 DIAGNOSIS — R1310 Dysphagia, unspecified: Secondary | ICD-10-CM

## 2022-02-26 DIAGNOSIS — Z1329 Encounter for screening for other suspected endocrine disorder: Secondary | ICD-10-CM | POA: Diagnosis not present

## 2022-02-26 DIAGNOSIS — Z131 Encounter for screening for diabetes mellitus: Secondary | ICD-10-CM | POA: Diagnosis not present

## 2022-02-26 DIAGNOSIS — I1A0 Resistant hypertension: Secondary | ICD-10-CM | POA: Diagnosis not present

## 2022-02-26 DIAGNOSIS — E782 Mixed hyperlipidemia: Secondary | ICD-10-CM

## 2022-02-26 DIAGNOSIS — F419 Anxiety disorder, unspecified: Secondary | ICD-10-CM

## 2022-02-26 DIAGNOSIS — Z0001 Encounter for general adult medical examination with abnormal findings: Secondary | ICD-10-CM | POA: Diagnosis not present

## 2022-02-26 DIAGNOSIS — G4733 Obstructive sleep apnea (adult) (pediatric): Secondary | ICD-10-CM

## 2022-02-26 DIAGNOSIS — M85852 Other specified disorders of bone density and structure, left thigh: Secondary | ICD-10-CM

## 2022-02-26 DIAGNOSIS — M4800 Spinal stenosis, site unspecified: Secondary | ICD-10-CM

## 2022-02-26 DIAGNOSIS — K219 Gastro-esophageal reflux disease without esophagitis: Secondary | ICD-10-CM

## 2022-02-26 DIAGNOSIS — E559 Vitamin D deficiency, unspecified: Secondary | ICD-10-CM

## 2022-02-26 DIAGNOSIS — H5711 Ocular pain, right eye: Secondary | ICD-10-CM

## 2022-02-26 DIAGNOSIS — Z5320 Procedure and treatment not carried out because of patient's decision for unspecified reasons: Secondary | ICD-10-CM

## 2022-02-26 DIAGNOSIS — F32A Depression, unspecified: Secondary | ICD-10-CM | POA: Insufficient documentation

## 2022-02-26 MED ORDER — ESOMEPRAZOLE MAGNESIUM 20 MG PO CPDR: 20.0000 mg | DELAYED_RELEASE_CAPSULE | Freq: Every day | ORAL | 1 refills | Status: DC

## 2022-02-26 NOTE — Assessment & Plan Note (Signed)
She endorses nightly compliance with CPAP

## 2022-02-26 NOTE — Progress Notes (Signed)
Established Patient Office Visit  Subjective   Patient ID: Erica Gallegos, female    DOB: 26-Mar-1955  Age: 67 y.o. MRN: [id number]  Chief Complaint  Patient presents with   Hypertension    Six month follow up. Patient states as the day go one she has pressure behind the right eye.   Erica Gallegos returns to care today for follow up.  She was last seen by me for follow-up on 10/22/21.  No medication changes made at that time.  In the interim she has been seen by gastroenterology and underwent EGD with dilation as well as colonoscopy on 12/7.  She has also been seen by cardiology and endocrinology for follow-up.  There have otherwise been no acute interval events. Erica Gallegos reports feeling fairly well today.  She endorses pressure behind her right eye.  She states that this is a chronic issue that has happened in the past.  She has previously been evaluated by her ophthalmologist, who told her she would need to see a "eye muscle specialist".  She also describes a history of retinal hemorrhage in the right eye.  She plans to contact her ophthalmologist to schedule a follow-up appointment.  She is otherwise asymptomatic and has no acute concerns to discuss today.  Past Medical History:  Diagnosis Date   Anxiety    Arthritis    Complication of anesthesia    Patient woke up during anesthesia with bunionectomy   GERD (gastroesophageal reflux disease)    HLD (hyperlipidemia)    HTN (hypertension)    Major depressive disorder    Migraine with aura    PONV (postoperative nausea and vomiting)    RAS (renal artery stenosis) (HCC)    Sleep apnea    Sleep related hypoxia    Spinal stenosis    Past Surgical History:  Procedure Laterality Date   BUNIONECTOMY     CATARACT EXTRACTION     CHOLECYSTECTOMY     COLONOSCOPY WITH PROPOFOL N/A 12/13/2021   Procedure: COLONOSCOPY WITH PROPOFOL;  Surgeon: Daneil Dolin, MD;  [facility];  Service: Endoscopy;  Laterality: N/A;  8:00am, asa  2   ESOPHAGOGASTRODUODENOSCOPY (EGD) WITH PROPOFOL N/A 04/05/2021   Procedure: ESOPHAGOGASTRODUODENOSCOPY (EGD) WITH PROPOFOL;  Surgeon: Daneil Dolin, MD;  [facility];  Service: Endoscopy;  Laterality: N/A;  10:15am   ESOPHAGOGASTRODUODENOSCOPY (EGD) WITH PROPOFOL N/A 12/13/2021   Procedure: ESOPHAGOGASTRODUODENOSCOPY (EGD) WITH PROPOFOL;  Surgeon: Daneil Dolin, MD;  [facility];  Service: Endoscopy;  Laterality: N/A;   MALONEY DILATION N/A 04/05/2021   Procedure: Venia Minks DILATION;  Surgeon: Daneil Dolin, MD;  [facility];  Service: Endoscopy;  Laterality: N/A;   MALONEY DILATION N/A 12/13/2021   Procedure: Venia Minks DILATION;  Surgeon: Daneil Dolin, MD;  [facility];  Service: Endoscopy;  Laterality: N/A;   Social History   Tobacco Use   Smoking status: Former    Types: Cigarettes   Smokeless tobacco: Never   Tobacco comments:    She quit smoking in the early 90's  Vaping Use   Vaping Use: Every day  Substance Use Topics   Alcohol use: Yes    Comment: seldom   Drug use: Yes    Types: Marijuana    Comment: CBD cream; Marijuana a couple times a week. helps with her anxiety and migraines.   Family History  Problem Relation Age of Onset   Cancer Mother    Hypertension Mother  Diabetes Mother    Heart disease Mother    Hyperlipidemia Mother    Cancer Father    Stroke Father    Hypertension Father    Heart disease Father    Hypertension Sister    Lupus Sister    Hypertension Sister    Cancer Brother    Brain cancer Brother    Non-Hodgkin's lymphoma Brother    Colon cancer Neg Hx    Allergies  Allergen Reactions   Atenolol Swelling   Other Other (See Comments)    Do not eat meat   Amlodipine Swelling   Review of Systems  Eyes:  Positive for pain (pressure behind R eye).  All other systems reviewed and are negative.    Objective:     BP 136/71 (BP Location: Left Arm, Patient Position: Sitting, Cuff Size:  Normal)   Pulse 74   Ht 5' 6"$  (1.676 m)   Wt 151 lb 9.6 oz (68.8 kg)   SpO2 96%   BMI 24.47 kg/m  BP Readings from Last 3 Encounters:  02/26/22 136/71  01/15/22 139/80  12/26/21 122/82   Physical Exam Constitutional:      General: She is not in acute distress.    Appearance: Normal appearance. She is not toxic-appearing.  HENT:     Head: Normocephalic and atraumatic.     Right Ear: External ear normal.     Left Ear: External ear normal.     Nose: Nose normal. No congestion or rhinorrhea.     Mouth/Throat:     Mouth: Mucous membranes are moist.     Pharynx: Oropharynx is clear. No oropharyngeal exudate or posterior oropharyngeal erythema.  Eyes:     General: No scleral icterus.    Extraocular Movements: Extraocular movements intact.     Conjunctiva/sclera: Conjunctivae normal.     Pupils: Pupils are equal, round, and reactive to light.  Cardiovascular:     Rate and Rhythm: Normal rate and regular rhythm.     Pulses: Normal pulses.     Heart sounds: Normal heart sounds. No murmur heard.    No friction rub. No gallop.  Pulmonary:     Effort: Pulmonary effort is normal.     Breath sounds: Normal breath sounds. No wheezing, rhonchi or rales.  Abdominal:     General: Abdomen is flat. Bowel sounds are normal. There is no distension.     Palpations: Abdomen is soft.     Tenderness: There is no abdominal tenderness.  Musculoskeletal:        General: No swelling. Normal range of motion.     Cervical back: Normal range of motion.     Right lower leg: No edema.     Left lower leg: No edema.  Lymphadenopathy:     Cervical: No cervical adenopathy.  Skin:    General: Skin is warm and dry.     Capillary Refill: Capillary refill takes less than 2 seconds.     Coloration: Skin is not jaundiced.  Neurological:     General: No focal deficit present.     Mental Status: She is alert and oriented to person, place, and time.     Gait: Gait abnormal (Ambulates with walker).   Psychiatric:        Mood and Affect: Mood normal.        Behavior: Behavior normal.   Last metabolic panel Lab Results  Component Value Date   GLUCOSE 82 12/04/2021   NA 139 12/04/2021   K 4.9 12/04/2021  CL 112 (H) 12/04/2021   CO2 24 12/04/2021   BUN 27 (H) 12/04/2021   CREATININE 1.61 (H) 12/04/2021   GFRNONAA 36 (L) 12/04/2021   CALCIUM 9.3 12/04/2021   PROT 6.8 10/03/2021   BILITOT 0.7 10/03/2021   AST 17 10/03/2021   ALT 27 10/03/2021   ANIONGAP 3 (L) 12/04/2021   Last lipids Lab Results  Component Value Date   CHOL 136 08/10/2021   HDL 62 08/10/2021   LDLCALC 56 08/10/2021   TRIG 92 08/10/2021   CHOLHDL 2.2 08/10/2021   Last thyroid functions Lab Results  Component Value Date   TSH 1.260 10/22/2021   Last vitamin D Lab Results  Component Value Date   VD25OH 36 08/10/2021   Last vitamin B12 and Folate Lab Results  Component Value Date   [id number] 10/22/2021   FOLATE 10.5 10/22/2021   The 10-year ASCVD risk score (Arnett DK, et al., 2019) is: 5.7%    Assessment & Plan:   Problem List Items Addressed This Visit       Hypertension    History of resistant hypertension.  Followed by cardiology.  She is currently prescribed chlorthalidone, hydralazine, carvedilol, lisinopril, and Aldactone.  Her blood pressure today is 136/71. -No medication changes today      OSA on CPAP    She endorses nightly compliance with CPAP      Dysphagia    Followed by GI (Dr. Gala Romney).  S/p EGD with dilation on 12/7.  Symptoms have significantly improved following endoscopy.      Gastroesophageal reflux disease - Primary    Symptoms are currently well-controlled with lansoprazole.  Unfortunately she reports today that her insurance will no longer cover lansoprazole and she requests to switch to a different medication. -Nexium 20 mg daily has been prescribed today      Osteopenia of neck of left femur    Followed by endocrinology and is currently prescribed  Fosamax.  Repeat DEXA scan planned for October 2025.      Hyperlipidemia    She is currently prescribed atorvastatin, ezetimibe, and takes an omega-3 supplement.  Lipid panel updated in August.  Total cholesterol 136 and LDL 56. -No medication changes today      Spinal stenosis    Currently prescribed gabapentin, which is effective in relieving her pain.  No changes today.      Anxiety and depression    Followed by psychiatry at [facility].  She is currently prescribed Wellbutrin SR, Klonopin, Lamictal, and Cymbalta.  Her mood is stable currently.  No changes today.      Ocular pain, right eye    She describes pressure behind her right eye.  This is not a new issue and has happened previously.  She has previously been evaluated by ophthalmology and told that she needs to see "an eye muscle specialist".  She plans to follow-up with her ophthalmologist to further discuss this.  No acute findings on examination today.      Return in about 3 months (around 05/27/2022).   Johnette Abraham, MD

## 2022-02-26 NOTE — Assessment & Plan Note (Signed)
Followed by GI (Dr. Gala Romney).  S/p EGD with dilation on 12/7.  Symptoms have significantly improved following endoscopy.

## 2022-02-26 NOTE — Assessment & Plan Note (Signed)
Followed by psychiatry at [facility].  She is currently prescribed Wellbutrin SR, Klonopin, Lamictal, and Cymbalta.  Her mood is stable currently.  No changes today.

## 2022-02-26 NOTE — Assessment & Plan Note (Addendum)
She is currently prescribed atorvastatin, ezetimibe, and takes an omega-3 supplement.  Lipid panel updated in August.  Total cholesterol 136 and LDL 56. -No medication changes today

## 2022-02-26 NOTE — Assessment & Plan Note (Signed)
Followed by endocrinology and is currently prescribed Fosamax.  Repeat DEXA scan planned for October 2025.

## 2022-02-26 NOTE — Assessment & Plan Note (Signed)
Symptoms are currently well-controlled with lansoprazole.  Unfortunately she reports today that her insurance will no longer cover lansoprazole and she requests to switch to a different medication. -Nexium 20 mg daily has been prescribed today

## 2022-02-26 NOTE — Assessment & Plan Note (Signed)
Currently prescribed gabapentin, which is effective in relieving her pain.  No changes today.

## 2022-02-26 NOTE — Assessment & Plan Note (Signed)
She describes pressure behind her right eye.  This is not a new issue and has happened previously.  She has previously been evaluated by ophthalmology and told that she needs to see "an eye muscle specialist".  She plans to follow-up with her ophthalmologist to further discuss this.  No acute findings on examination today.

## 2022-02-26 NOTE — Patient Instructions (Signed)
It was a pleasure to see you today.  Thank you for giving [place] the opportunity to be involved in your care.  Below is a brief recap of your visit and next steps.  We will plan to see you again in 3 months.  Summary Switch Prevacid to Nexium.  Repeat labs ordered today Follow up in 3 months

## 2022-02-26 NOTE — Assessment & Plan Note (Signed)
History of resistant hypertension.  Followed by cardiology.  She is currently prescribed chlorthalidone, hydralazine, carvedilol, lisinopril, and Aldactone.  Her blood pressure today is 136/71. -No medication changes today

## 2022-02-27 ENCOUNTER — Other Ambulatory Visit: Payer: Self-pay | Admitting: Internal Medicine

## 2022-02-27 DIAGNOSIS — E875 Hyperkalemia: Secondary | ICD-10-CM

## 2022-02-27 LAB — CBC WITH DIFFERENTIAL/PLATELET
Basophils Absolute: 0.1 10*3/uL (ref 0.0–0.2)
Basos: 1 %
EOS (ABSOLUTE): 0.3 10*3/uL (ref 0.0–0.4)
Eos: 4 %
Hematocrit: 37.8 % (ref 34.0–46.6)
Hemoglobin: 12.3 g/dL (ref 11.1–15.9)
Immature Grans (Abs): 0 10*3/uL (ref 0.0–0.1)
Immature Granulocytes: 0 %
Lymphocytes Absolute: 2 10*3/uL (ref 0.7–3.1)
Lymphs: 25 %
MCH: 31.4 pg (ref 26.6–33.0)
MCHC: 32.5 g/dL (ref 31.5–35.7)
MCV: 96 fL (ref 79–97)
Monocytes Absolute: 0.4 10*3/uL (ref 0.1–0.9)
Monocytes: 6 %
Neutrophils Absolute: 5 10*3/uL (ref 1.4–7.0)
Neutrophils: 64 %
Platelets: 265 10*3/uL (ref 150–450)
RBC: 3.92 x10E6/uL (ref 3.77–5.28)
RDW: 12.5 % (ref 11.7–15.4)
WBC: 7.8 10*3/uL (ref 3.4–10.8)

## 2022-02-27 LAB — HEMOGLOBIN A1C
Est. average glucose Bld gHb Est-mCnc: 105 mg/dL
Hgb A1c MFr Bld: 5.3 % (ref 4.8–5.6)

## 2022-02-27 LAB — VITAMIN D 25 HYDROXY (VIT D DEFICIENCY, FRACTURES): Vit D, 25-Hydroxy: 43.8 ng/mL (ref 30.0–100.0)

## 2022-02-27 LAB — B12 AND FOLATE PANEL
Folate: 8.4 ng/mL (ref >3.0)
Vitamin B-12: 1447 pg/mL — ABNORMAL HIGH (ref 232–1245)

## 2022-02-27 LAB — CMP14+EGFR
ALT: 23 IU/L (ref 0–32)
AST: 16 IU/L (ref 0–40)
Albumin/Globulin Ratio: 2 (ref 1.2–2.2)
Albumin: 4.3 g/dL (ref 3.9–4.9)
Alkaline Phosphatase: 82 IU/L (ref 44–121)
BUN/Creatinine Ratio: 22 (ref 12–28)
BUN: 29 mg/dL — ABNORMAL HIGH (ref 8–27)
Bilirubin Total: 0.3 mg/dL (ref 0.0–1.2)
CO2: 20 mmol/L (ref 20–29)
Calcium: 10.7 mg/dL — ABNORMAL HIGH (ref 8.7–10.3)
Chloride: 105 mmol/L (ref 96–106)
Creatinine, Ser: 1.33 mg/dL — ABNORMAL HIGH (ref 0.57–1.00)
Globulin, Total: 2.2 g/dL (ref 1.5–4.5)
Glucose: 90 mg/dL (ref 70–99)
Potassium: 5.4 mmol/L — ABNORMAL HIGH (ref 3.5–5.2)
Sodium: 140 mmol/L (ref 134–144)
Total Protein: 6.5 g/dL (ref 6.0–8.5)
eGFR: 45 mL/min/{1.73_m2} — ABNORMAL LOW (ref >59)

## 2022-02-27 LAB — LIPID PANEL
Chol/HDL Ratio: 2.7 ratio (ref 0.0–4.4)
Cholesterol, Total: 125 mg/dL (ref 100–199)
HDL: 46 mg/dL (ref >39)
LDL Chol Calc (NIH): 53 mg/dL (ref 0–99)
Triglycerides: 153 mg/dL — ABNORMAL HIGH (ref 0–149)
VLDL Cholesterol Cal: 26 mg/dL (ref 5–40)

## 2022-02-27 LAB — TSH+FREE T4
Free T4: 1 ng/dL (ref 0.82–1.77)
TSH: 1.72 u[IU]/mL (ref 0.450–4.500)

## 2022-03-04 ENCOUNTER — Other Ambulatory Visit: Payer: Self-pay | Admitting: Internal Medicine

## 2022-03-04 ENCOUNTER — Telehealth: Payer: Self-pay | Admitting: Internal Medicine

## 2022-03-04 NOTE — Telephone Encounter (Signed)
Med refill  cyclobenzaprine (FLEXERIL) 10 MG tablet [id number]  Pharmacy  [id number] - [place], [address], [place] [address] Phone: [phone]  Fax: [phone]

## 2022-03-05 DIAGNOSIS — F4310 Post-traumatic stress disorder, unspecified: Secondary | ICD-10-CM | POA: Diagnosis not present

## 2022-03-12 NOTE — Progress Notes (Unsigned)
GI Office Note    Referring Provider: Johnette Abraham, MD Primary Care Physician:  Johnette Abraham, MD Primary Gastroenterologist: Cristopher Estimable.Rourk, MD  Date:  03/14/2022  ID:  Erica Gallegos, DOB 10/26/1955, MRN [id number]   Chief Complaint   Chief Complaint  Patient presents with   Follow-up   History of Present Illness  Erica Gallegos is a 67 y.o. female with a history of GERD/dysphagia and schatzki rings requiring dilation, HLD, HTN, depression, anxiety, renal artery stenosis, sleep apnea presenting today for follow up post procedures.   Last office visit 11/13/21. Underwent dilation of schatzki ring early 2023. Having recurrent dysphagia and lansoprazole not controlling GERD. Denied melena or brbpr. H/o fatty liver with normal LFTs. Patient lost 22 lbs from March to this office visit. Reported her sister had colon polyps and patient stated she had a colonoscopy 5 years prior that was negative. She was offered and scheduled for a colonoscopy. BPE ordered.   BPE 11/14/21: -narrowing GE junction and obstructed 12.54m barium tablet. Simple stricture -tiny amount of unwitnessed aspiration of contrast with spontaneous cough reflex.   EGD 12/13/21: -non critical Schatzki ring s/p dilation up to 60 Fr and forcep disruption of ring -if recurrent dysphagia would recommend manometry -Advised Nexium BID  Colonoscopy 12/13/21: -redundant colon -internal hemorrhoids, non bleeding -Repeat colonoscopy in 5 years   Today: Taking Nexium - not taking because she forgets to take it at noon. No reflux medications. At times has a sick feeling to her stomach and that can cause nausea. Does take vitamin D3.   Doing pretty good. When she is drinking something she feels like the liquid will stop. Feels like she will gag. Last week it has happened twice with water or soda. Not a big fan of water. Will drink iced tea but drinks caffeine free sodas.   Does have balls of stool at times and not  very hard - looks softer. Has a BM about twice per week. No bloating, abdominal pain.   Sister has colon polyps.    Current Outpatient Medications  Medication Sig Dispense Refill   atorvastatin (LIPITOR) 40 MG tablet Take 1 tablet (40 mg total) by mouth daily. 90 tablet 3   buPROPion (WELLBUTRIN SR) 200 MG 12 hr tablet Take 200 mg by mouth 2 (two) times daily.     Calcium Carb-Cholecalciferol (CALTRATE 600+D3) 600-20 MG-MCG TABS Take 600 mg by mouth daily. (Patient taking differently: Take 1 tablet by mouth daily.) 60 tablet 3   carvedilol (COREG) 12.5 MG tablet TAKE 1 TABLET(12.5 MG) BY MOUTH TWICE DAILY 180 tablet 0   chlorthalidone (HYGROTON) 25 MG tablet TAKE 1 AND 1/2 TABLETS(37.5 MG) BY MOUTH DAILY 135 tablet 3   Cholecalciferol (VITAMIN D3) 50 MCG (2000 UT) TABS Take 2,000 Units by mouth daily.     clonazePAM (KLONOPIN) 0.5 MG tablet Take 0.5 mg by mouth daily as needed for anxiety (Sleep).     cyclobenzaprine (FLEXERIL) 10 MG tablet TAKE 1 TABLET BY MOUTH EVERY NIGHT AT BEDTIME AS NEEDED FOR BACK PAIN OR NERVES 30 tablet 0   DULoxetine (CYMBALTA) 60 MG capsule Take 60 mg by mouth 2 (two) times daily.     ezetimibe (ZETIA) 10 MG tablet TAKE 1 TABLET(10 MG) BY MOUTH DAILY 90 tablet 1   gabapentin (NEURONTIN) 300 MG capsule TAKE 1 CAPSULE BY MOUTH EVERY MORNING AND EVERY EVENING, THEN 2 CAPSULES AT BEDTIME 360 capsule 0   hydrALAZINE (APRESOLINE) 100 MG tablet TAKE 1 TABLET(100  MG) BY MOUTH TWICE DAILY 180 tablet 0   lamoTRIgine (LAMICTAL) 25 MG tablet Take 2 tablets (50 mg total) by mouth daily. (Patient taking differently: Take 25-50 mg by mouth See admin instructions. Take 25 mg in the morning and 50 mg at bedtime) 60 tablet 3   lisinopril (ZESTRIL) 40 MG tablet Take 1 tablet (40 mg total) by mouth daily. 90 tablet 1   Melatonin 10 MG TABS Take 10 mg by mouth at bedtime.     spironolactone (ALDACTONE) 25 MG tablet TAKE 1 TABLET(25 MG) BY MOUTH DAILY 90 tablet 1   vitamin B-12  (CYANOCOBALAMIN) 500 MCG tablet Take 500 mcg by mouth daily.     vitamin C (ASCORBIC ACID) 500 MG tablet Take 500 mg by mouth daily.     alendronate (FOSAMAX) 70 MG tablet Take 1 tablet (70 mg total) by mouth every 7 (seven) days. Take with a full glass of water on an empty stomach. (Patient not taking: Reported on 03/14/2022) 12 tablet 3   diclofenac (VOLTAREN) 50 MG EC tablet Take 50 mg by mouth 2 (two) times daily as needed for mild pain or moderate pain. (Patient not taking: Reported on 03/14/2022)     esomeprazole (NEXIUM) 20 MG capsule Take 1 capsule (20 mg total) by mouth daily at 12 noon. (Patient not taking: Reported on 03/14/2022) 90 capsule 1   Omega-3 Fatty Acids (FISH OIL) 1000 MG CAPS Take 4,000 mg by mouth daily. (Patient not taking: Reported on 03/14/2022)     No current facility-administered medications for this visit.    Past Medical History:  Diagnosis Date   Anxiety    Arthritis    Complication of anesthesia    Patient woke up during anesthesia with bunionectomy   GERD (gastroesophageal reflux disease)    HLD (hyperlipidemia)    HTN (hypertension)    Major depressive disorder    Migraine with aura    PONV (postoperative nausea and vomiting)    RAS (renal artery stenosis) (HCC)    Sleep apnea    Sleep related hypoxia    Spinal stenosis     Past Surgical History:  Procedure Laterality Date   BUNIONECTOMY     CATARACT EXTRACTION     CHOLECYSTECTOMY     COLONOSCOPY WITH PROPOFOL N/A 12/13/2021   Procedure: COLONOSCOPY WITH PROPOFOL;  Surgeon: Daneil Dolin, MD;  [facility];  Service: Endoscopy;  Laterality: N/A;  8:00am, asa 2   ESOPHAGOGASTRODUODENOSCOPY (EGD) WITH PROPOFOL N/A 04/05/2021   Procedure: ESOPHAGOGASTRODUODENOSCOPY (EGD) WITH PROPOFOL;  Surgeon: Daneil Dolin, MD;  [facility];  Service: Endoscopy;  Laterality: N/A;  10:15am   ESOPHAGOGASTRODUODENOSCOPY (EGD) WITH PROPOFOL N/A 12/13/2021   Procedure: ESOPHAGOGASTRODUODENOSCOPY  (EGD) WITH PROPOFOL;  Surgeon: Daneil Dolin, MD;  [facility];  Service: Endoscopy;  Laterality: N/A;   MALONEY DILATION N/A 04/05/2021   Procedure: Venia Minks DILATION;  Surgeon: Daneil Dolin, MD;  [facility];  Service: Endoscopy;  Laterality: N/A;   MALONEY DILATION N/A 12/13/2021   Procedure: Venia Minks DILATION;  Surgeon: Daneil Dolin, MD;  [facility];  Service: Endoscopy;  Laterality: N/A;    Family History  Problem Relation Age of Onset   Cancer Mother    Hypertension Mother    Diabetes Mother    Heart disease Mother    Hyperlipidemia Mother    Cancer Father    Stroke Father    Hypertension Father    Heart disease Father  Hypertension Sister    Lupus Sister    Hypertension Sister    Colon polyps Sister    Cancer Brother    Brain cancer Brother    Non-Hodgkin's lymphoma Brother    Colon cancer Neg Hx     Allergies as of 03/14/2022 - Review Complete 03/14/2022  Allergen Reaction Noted   Atenolol Swelling 07/24/2021   Other Other (See Comments) 12/10/2021   Amlodipine Swelling 08/28/2020    Social History   Socioeconomic History   Marital status: Married    Spouse name: Not on file   Number of children: 3   Years of education: Not on file   Highest education level: Not on file  Occupational History   Not on file  Tobacco Use   Smoking status: Former    Types: Cigarettes   Smokeless tobacco: Never   Tobacco comments:    She quit smoking in the early 90's  Vaping Use   Vaping Use: Every day  Substance and Sexual Activity   Alcohol use: Yes    Comment: seldom   Drug use: Yes    Types: Marijuana    Comment: CBD cream; Marijuana a couple times a week. helps with her anxiety and migraines.   Sexual activity: Yes    Birth control/protection: None, Post-menopausal  Other Topics Concern   Not on file  Social History Narrative   Lives with her husband, retired.    Social Determinants of Health   Financial Resource  Strain: Unknown (08/28/2020)   Overall Financial Resource Strain (CARDIA)    Difficulty of Paying Living Expenses: Patient refused  Food Insecurity: Unknown (08/28/2020)   Hunger Vital Sign    Worried About Running Out of Food in the Last Year: Patient refused    [place] in the Last Year: Patient refused  Transportation Needs: Unknown (08/28/2020)   PRAPARE - Transportation    Lack of Transportation (Medical): Patient refused    Lack of Transportation (Non-Medical): Patient refused  Physical Activity: Unknown (08/28/2020)   Exercise Vital Sign    Days of Exercise per Week: Patient refused    Minutes of Exercise per Session: Patient refused  Stress: Unknown (08/28/2020)   Albers of Stress : Patient refused  Social Connections: Unknown (08/28/2020)   Social Connection and Isolation Panel [NHANES]    Frequency of Communication with Friends and Family: Patient refused    Frequency of Social Gatherings with Friends and Family: Patient refused    Attends Religious Services: Patient refused    Marine scientist or Organizations: Patient refused    Attends Archivist Meetings: Patient refused    Marital Status: Patient refused    Review of Systems   Gen: Denies fever, chills, anorexia. Denies fatigue, weakness, weight loss.  CV: Denies chest pain, palpitations, syncope, peripheral edema, and claudication. Resp: Denies dyspnea at rest, cough, wheezing, coughing up blood, and pleurisy. GI: See HPI Derm: Denies rash, itching, dry skin Psych: Denies depression, anxiety, memory loss, confusion. No homicidal or suicidal ideation.  Heme: Denies bruising, bleeding, and enlarged lymph nodes.   Physical Exam   BP (!) 140/80 (BP Location: Left Arm, Patient Position: Sitting, Cuff Size: Normal)   Pulse (!) 54   Temp (!) 97.5 F (36.4 C) (Temporal)   Ht '5\' 6"'$  (1.676 m)   Wt 156 lb (70.8 kg)    SpO2 97%   BMI 25.18 kg/m   General:  Alert and oriented. No distress noted. Pleasant and cooperative.  Head:  Normocephalic and atraumatic. Eyes:  Conjuctiva clear without scleral icterus. Mouth:  Oral mucosa pink and moist. Good dentition. No lesions. Lungs:  Clear to auscultation bilaterally. No wheezes, rales, or rhonchi. No distress.  Heart:  S1, S2 present without murmurs appreciated.  Abdomen:  +BS, soft, non-tender and non-distended. No rebound or guarding. Small soft reducible midline ventral hernia.  Rectal: deferred Msk:  Symmetrical without gross deformities. Normal posture. Extremities:  Without edema. Neurologic:  Alert and  oriented x4 Psych:  Alert and cooperative. Normal mood and affect.   Assessment  Erica Gallegos is a 67 y.o. female with a history of GERD/dysphagia and schatzki rings requiring dilation, HLD, HTN, depression, anxiety, renal artery stenosis, sleep apnea, fatty liver presenting today for follow up post procedures.    GERD, Dysphagia: At times has a sick feeling to her side and will have some mild nausea but no vomiting.  Denies any typical reflux type symptoms.  Does admit to drinking caffeine free sodas.  Has had ongoing intermittent issues with dysphagia especially with liquids.  She has had multiple EGDs with dilations for Schatzki's rings most recent EGD in December with Schatzki's ring that was dilated up to 60 [place] with [facility] dilator and forceps disruption of ring performed.  She was advised to start taking Nexium twice daily.  Currently has not been taking it due to forgetting to take it at noon.  Best to avoid recurrent Schatzki's rings we need to control acid suppression.  Advised her to start taking Nexium 20 mg in the morning.  We may need to increase dose or frequency going forward.  Overall feeling pretty well.  Has had 2 episodes of gagging on liquids last week.  Discussed potentially performing manometry however patient would like to try  consistent use of Nexium first.  Her symptoms almost seem more consistent with oropharyngeal dysphagia versus esophageal dysphagia therefore workup will be manometry versus MBSS with speech therapy.  Constipation: Not a big water drinker.  Primarily drinks iced tea and sodas.  Has no balls of stool at times that are not Rehman, usually with softer in nature.  Has a BM about twice per week without any significant abdominal pain or bloating.  No melena or BRBPR.  Recommended daily fiber supplementation  Family history of colon polyps: Family history of colon polyps her sister.  Colonoscopy December 2023 with redundant colon, no polyps, sigmoid diverticulosis.  Advised repeat in 5 years.  PLAN   Start Nexium 20 mg in the morning, 30 minutes.  May increase dose to 40 mg daily if inadequate results. Will proceed with manometry if ongoing symptoms vs speech eval (MBSS) Continue Vitamin D supplements.  Daily fiber supplementation with Benefiber or Metamucil Follow up in 3-4 months.     Venetia Night, MSN, FNP-BC, AGACNP-BC [facility]

## 2022-03-14 ENCOUNTER — Encounter: Payer: Self-pay | Admitting: Gastroenterology

## 2022-03-14 ENCOUNTER — Ambulatory Visit: Payer: BC Managed Care – PPO | Admitting: Gastroenterology

## 2022-03-14 VITALS — BP 140/80 | HR 54 | Temp 97.5°F | Ht 66.0 in | Wt 156.0 lb

## 2022-03-14 DIAGNOSIS — R1310 Dysphagia, unspecified: Secondary | ICD-10-CM | POA: Diagnosis not present

## 2022-03-14 DIAGNOSIS — Z83719 Family history of colon polyps, unspecified: Secondary | ICD-10-CM | POA: Diagnosis not present

## 2022-03-14 DIAGNOSIS — K5900 Constipation, unspecified: Secondary | ICD-10-CM

## 2022-03-14 DIAGNOSIS — E875 Hyperkalemia: Secondary | ICD-10-CM | POA: Diagnosis not present

## 2022-03-14 NOTE — Patient Instructions (Signed)
Start taking Nexium 20 mg once daily in the mornings, 30 minutes prior to breakfast.  Continue your vitamin D supplementation.  If you continue to have issues with swallowing liquids despite being on medication therapy for 3 to 4 weeks please reach out and let me know and we can consider referral for manometry or to speech therapy to further evaluate.  For your bowel habits he may benefit from a daily fiber supplement to have more productive bowel movements.  I would recommend Benefiber or Metamucil 2-3 teaspoons daily.  Otherwise we will plan to follow-up in 3-4 months.  Please not hesitate to reach out if you have any questions or concerns.  It was a pleasure to see you today. I want to create trusting relationships with patients. If you receive a survey regarding your visit,  I greatly appreciate you taking time to fill this out on paper or through your MyChart. I value your feedback.  Venetia Night, MSN, FNP-BC, AGACNP-BC [facility]

## 2022-03-15 LAB — CMP14+EGFR
ALT: 25 IU/L (ref 0–32)
AST: 21 IU/L (ref 0–40)
Albumin/Globulin Ratio: 1.9 (ref 1.2–2.2)
Albumin: 4.4 g/dL (ref 3.9–4.9)
Alkaline Phosphatase: 78 IU/L (ref 44–121)
BUN/Creatinine Ratio: 16 (ref 12–28)
BUN: 19 mg/dL (ref 8–27)
Bilirubin Total: 0.5 mg/dL (ref 0.0–1.2)
CO2: 21 mmol/L (ref 20–29)
Calcium: 10.5 mg/dL — ABNORMAL HIGH (ref 8.7–10.3)
Chloride: 104 mmol/L (ref 96–106)
Creatinine, Ser: 1.18 mg/dL — ABNORMAL HIGH (ref 0.57–1.00)
Globulin, Total: 2.3 g/dL (ref 1.5–4.5)
Glucose: 94 mg/dL (ref 70–99)
Potassium: 5.1 mmol/L (ref 3.5–5.2)
Sodium: 139 mmol/L (ref 134–144)
Total Protein: 6.7 g/dL (ref 6.0–8.5)
eGFR: 52 mL/min/{1.73_m2} — ABNORMAL LOW (ref >59)

## 2022-03-28 DIAGNOSIS — F4310 Post-traumatic stress disorder, unspecified: Secondary | ICD-10-CM | POA: Diagnosis not present

## 2022-03-29 DIAGNOSIS — M48062 Spinal stenosis, lumbar region with neurogenic claudication: Secondary | ICD-10-CM | POA: Diagnosis not present

## 2022-04-22 ENCOUNTER — Other Ambulatory Visit: Payer: Self-pay | Admitting: Nurse Practitioner

## 2022-04-22 ENCOUNTER — Other Ambulatory Visit: Payer: Self-pay | Admitting: Internal Medicine

## 2022-04-22 DIAGNOSIS — E782 Mixed hyperlipidemia: Secondary | ICD-10-CM

## 2022-04-23 ENCOUNTER — Other Ambulatory Visit: Payer: Self-pay | Admitting: Internal Medicine

## 2022-04-25 DIAGNOSIS — F4310 Post-traumatic stress disorder, unspecified: Secondary | ICD-10-CM | POA: Diagnosis not present

## 2022-05-05 ENCOUNTER — Other Ambulatory Visit: Payer: Self-pay | Admitting: Internal Medicine

## 2022-05-28 ENCOUNTER — Ambulatory Visit: Payer: BC Managed Care – PPO | Admitting: Internal Medicine

## 2022-06-02 ENCOUNTER — Other Ambulatory Visit: Payer: Self-pay | Admitting: Internal Medicine

## 2022-06-05 DIAGNOSIS — M48062 Spinal stenosis, lumbar region with neurogenic claudication: Secondary | ICD-10-CM | POA: Diagnosis not present

## 2022-06-05 DIAGNOSIS — M419 Scoliosis, unspecified: Secondary | ICD-10-CM | POA: Diagnosis not present

## 2022-06-13 ENCOUNTER — Telehealth: Payer: Self-pay | Admitting: *Deleted

## 2022-06-13 ENCOUNTER — Other Ambulatory Visit: Payer: Self-pay

## 2022-06-13 DIAGNOSIS — G4733 Obstructive sleep apnea (adult) (pediatric): Secondary | ICD-10-CM

## 2022-06-13 NOTE — Telephone Encounter (Signed)
Spoke with patient. Advised order has been sent for supplies and a new mask. NFN at this time

## 2022-06-13 NOTE — Telephone Encounter (Signed)
Patient called and states she would like to get new CPAP mask/headgear for her CPAP machine.   Patient has an upcoming appointment on 08/20/22 in [facility]. Patient did not wish to drive to [place].   Please call and advise patient. [phone]

## 2022-06-13 NOTE — Telephone Encounter (Signed)
Dr Craige Cotta- are you okay with [place] ordering new CPAP supplies for this pt? She has not been seen since 04/04/21' Has appt pending 08/20/22

## 2022-06-13 NOTE — Telephone Encounter (Signed)
Okay to put in order for new CPAP mask and supplies.

## 2022-06-18 ENCOUNTER — Encounter: Payer: Self-pay | Admitting: Internal Medicine

## 2022-06-18 ENCOUNTER — Ambulatory Visit (INDEPENDENT_AMBULATORY_CARE_PROVIDER_SITE_OTHER): Payer: Medicare Other | Admitting: Internal Medicine

## 2022-06-18 VITALS — BP 111/65 | HR 67 | Ht 66.0 in | Wt 150.8 lb

## 2022-06-18 DIAGNOSIS — F32A Depression, unspecified: Secondary | ICD-10-CM

## 2022-06-18 DIAGNOSIS — M4800 Spinal stenosis, site unspecified: Secondary | ICD-10-CM

## 2022-06-18 DIAGNOSIS — K219 Gastro-esophageal reflux disease without esophagitis: Secondary | ICD-10-CM

## 2022-06-18 DIAGNOSIS — M85852 Other specified disorders of bone density and structure, left thigh: Secondary | ICD-10-CM

## 2022-06-18 DIAGNOSIS — Z23 Encounter for immunization: Secondary | ICD-10-CM

## 2022-06-18 DIAGNOSIS — F419 Anxiety disorder, unspecified: Secondary | ICD-10-CM | POA: Diagnosis not present

## 2022-06-18 MED ORDER — CLONAZEPAM 0.5 MG PO TABS
0.5000 mg | ORAL_TABLET | Freq: Every day | ORAL | 0 refills | Status: DC | PRN
Start: 2022-06-18 — End: 2022-07-30

## 2022-06-18 NOTE — Assessment & Plan Note (Signed)
History of lumbar spinal stenosis.  Back and lower extremity pain have worsened recently.  She is followed by neurosurgery (Dr. Jake Samples).  She reports that they are discussing surgical intervention given worsening of her symptoms.  Currently managing her pain with gabapentin.  She is scheduled for follow-up with neurosurgery on 6/28.

## 2022-06-18 NOTE — Progress Notes (Signed)
Established Patient Office Visit  Subjective   Patient ID: Erica Gallegos, female    DOB: 1955/08/02  Age: 67 y.o. MRN: [id number]  Chief Complaint  Patient presents with   Hyperlipidemia    Follow up    Erica Gallegos returns to care today for routine follow-up.  Last evaluated by me on 2/20.  No medication changes were made at that time.  In the interim she has been seen by gastroenterology and neurosurgery. Erica Gallegos endorses chronic back and lower extremity pain that has worsened recently.  She has a history of lumbar stenosis and is followed by neurosurgery (Dr. Jake Samples).  They are discussing surgery.  She has follow-up scheduled for 6/28.  She has most recently been managing her pain with gabapentin. Erica Gallegos does not have any additional concerns to discuss today.  Past Medical History:  Diagnosis Date   Anxiety    Arthritis    Complication of anesthesia    Patient woke up during anesthesia with bunionectomy   GERD (gastroesophageal reflux disease)    HLD (hyperlipidemia)    HTN (hypertension)    Major depressive disorder    Migraine with aura    PONV (postoperative nausea and vomiting)    RAS (renal artery stenosis) (HCC)    Sleep apnea    Sleep related hypoxia    Spinal stenosis    Past Surgical History:  Procedure Laterality Date   BUNIONECTOMY     CATARACT EXTRACTION     CHOLECYSTECTOMY     COLONOSCOPY WITH PROPOFOL N/A 12/13/2021   Procedure: COLONOSCOPY WITH PROPOFOL;  Surgeon: Corbin Ade, MD;  Location: [facility];  Service: Endoscopy;  Laterality: N/A;  8:00am, asa 2   ESOPHAGOGASTRODUODENOSCOPY (EGD) WITH PROPOFOL N/A 04/05/2021   Procedure: ESOPHAGOGASTRODUODENOSCOPY (EGD) WITH PROPOFOL;  Surgeon: Corbin Ade, MD;  Location: [facility];  Service: Endoscopy;  Laterality: N/A;  10:15am   ESOPHAGOGASTRODUODENOSCOPY (EGD) WITH PROPOFOL N/A 12/13/2021   Procedure: ESOPHAGOGASTRODUODENOSCOPY (EGD) WITH PROPOFOL;  Surgeon: Corbin Ade, MD;   Location: [facility];  Service: Endoscopy;  Laterality: N/A;   MALONEY DILATION N/A 04/05/2021   Procedure: Elease Hashimoto DILATION;  Surgeon: Corbin Ade, MD;  Location: [facility];  Service: Endoscopy;  Laterality: N/A;   MALONEY DILATION N/A 12/13/2021   Procedure: Elease Hashimoto DILATION;  Surgeon: Corbin Ade, MD;  Location: [facility];  Service: Endoscopy;  Laterality: N/A;   Social History   Tobacco Use   Smoking status: Former    Types: Cigarettes   Smokeless tobacco: Never   Tobacco comments:    She quit smoking in the early 90's  Vaping Use   Vaping Use: Every day  Substance Use Topics   Alcohol use: Yes    Comment: seldom   Drug use: Yes    Types: Marijuana    Comment: CBD cream; Marijuana a couple times a week. helps with her anxiety and migraines.   Family History  Problem Relation Age of Onset   Cancer Mother    Hypertension Mother    Diabetes Mother    Heart disease Mother    Hyperlipidemia Mother    Cancer Father    Stroke Father    Hypertension Father    Heart disease Father    Hypertension Sister    Lupus Sister    Hypertension Sister    Colon polyps Sister    Cancer Brother    Brain cancer Brother    Non-Hodgkin's lymphoma Brother  Colon cancer Neg Hx    Allergies  Allergen Reactions   Atenolol Swelling   Other Other (See Comments)    Do not eat meat   Amlodipine Swelling   Review of Systems  Musculoskeletal:  Positive for back pain, joint pain and myalgias.  All other systems reviewed and are negative.    Objective:     BP 111/65   Pulse 67   Ht 5\' 6"  (1.676 m)   Wt 150 lb 12.8 oz (68.4 kg)   SpO2 93%   BMI 24.34 kg/m  BP Readings from Last 3 Encounters:  06/18/22 111/65  03/14/22 (!) 140/80  02/26/22 136/71   Physical Exam Constitutional:      General: She is not in acute distress.    Appearance: Normal appearance. She is not toxic-appearing.  HENT:     Head: Normocephalic and atraumatic.     Right Ear: External ear  normal.     Left Ear: External ear normal.     Nose: Nose normal. No congestion or rhinorrhea.     Mouth/Throat:     Mouth: Mucous membranes are moist.     Pharynx: Oropharynx is clear. No oropharyngeal exudate or posterior oropharyngeal erythema.  Eyes:     General: No scleral icterus.    Extraocular Movements: Extraocular movements intact.     Conjunctiva/sclera: Conjunctivae normal.     Pupils: Pupils are equal, round, and reactive to light.  Cardiovascular:     Rate and Rhythm: Normal rate and regular rhythm.     Pulses: Normal pulses.     Heart sounds: Normal heart sounds. No murmur heard.    No friction rub. No gallop.  Pulmonary:     Effort: Pulmonary effort is normal.     Breath sounds: Normal breath sounds. No wheezing, rhonchi or rales.  Abdominal:     General: Abdomen is flat. Bowel sounds are normal. There is no distension.     Palpations: Abdomen is soft.     Tenderness: There is no abdominal tenderness.  Musculoskeletal:        General: No swelling. Normal range of motion.     Cervical back: Normal range of motion.     Right lower leg: No edema.     Left lower leg: No edema.  Lymphadenopathy:     Cervical: No cervical adenopathy.  Skin:    General: Skin is warm and dry.     Capillary Refill: Capillary refill takes less than 2 seconds.     Coloration: Skin is not jaundiced.  Neurological:     General: No focal deficit present.     Mental Status: She is alert and oriented to person, place, and time.     Gait: Gait abnormal (Ambulates with walker).  Psychiatric:        Mood and Affect: Mood normal.        Behavior: Behavior normal.   Last CBC Lab Results  Component Value Date   WBC 7.8 02/26/2022   HGB 12.3 02/26/2022   HCT 37.8 02/26/2022   MCV 96 02/26/2022   MCH 31.4 02/26/2022   RDW 12.5 02/26/2022   PLT 265 02/26/2022   Last metabolic panel Lab Results  Component Value Date   GLUCOSE 94 03/14/2022   NA 139 03/14/2022   K 5.1 03/14/2022   CL  104 03/14/2022   CO2 21 03/14/2022   BUN 19 03/14/2022   CREATININE 1.18 (H) 03/14/2022   EGFR 52 (L) 03/14/2022   CALCIUM 10.5 (H)  03/14/2022   PROT 6.7 03/14/2022   ALBUMIN 4.4 03/14/2022   LABGLOB 2.3 03/14/2022   AGRATIO 1.9 03/14/2022   BILITOT 0.5 03/14/2022   ALKPHOS 78 03/14/2022   AST 21 03/14/2022   ALT 25 03/14/2022   ANIONGAP 3 (L) 12/04/2021   Last lipids Lab Results  Component Value Date   CHOL 125 02/26/2022   HDL 46 02/26/2022   LDLCALC 53 02/26/2022   TRIG 153 (H) 02/26/2022   CHOLHDL 2.7 02/26/2022   Last hemoglobin A1c Lab Results  Component Value Date   HGBA1C 5.3 02/26/2022   Last thyroid functions Lab Results  Component Value Date   TSH 1.720 02/26/2022   Last vitamin D Lab Results  Component Value Date   VD25OH 43.8 02/26/2022   Last vitamin B12 and Folate Lab Results  Component Value Date   VITAMINB12 1,447 (H) 02/26/2022   FOLATE 8.4 02/26/2022     Assessment & Plan:   Problem List Items Addressed This Visit       Gastroesophageal reflux disease    Symptoms remain well-controlled with Nexium 20 mg daily.  Recently seen by gastroenterology for follow-up.      Osteopenia of neck of left femur    Currently prescribed Fosamax.  This is managed by endocrinology.  Vitamin D and calcium levels were adequate on most recent labs.      Spinal stenosis    History of lumbar spinal stenosis.  Back and lower extremity pain have worsened recently.  She is followed by neurosurgery (Dr. Jake Samples).  She reports that they are discussing surgical intervention given worsening of her symptoms.  Currently managing her pain with gabapentin.  She is scheduled for follow-up with neurosurgery on 6/28.      Anxiety and depression - Primary    Previously followed at [facility], however she reports today that her insurance will no longer cover services at this facility.  She is currently prescribed Wellbutrin SR, Klonopin, Lamictal, and  Cymbalta.  Her mood remains stable.  She has requested a referral to psychiatry and additionally requests a refill of Klonopin. -PDMP reviewed and is appropriate.  Klonopin has been refilled today.  She has signed a controlled substance agreement. -Psychiatry referral placed      Need for pneumococcal 20-valent conjugate vaccination    PCV 20 administered today.      Return in about 3 months (around 09/18/2022).   Billie Lade, MD

## 2022-06-18 NOTE — Assessment & Plan Note (Signed)
PCV 20 administered today 

## 2022-06-18 NOTE — Assessment & Plan Note (Signed)
Symptoms remain well-controlled with Nexium 20 mg daily.  Recently seen by gastroenterology for follow-up.

## 2022-06-18 NOTE — Patient Instructions (Signed)
It was a pleasure to see you today.  Thank you for giving [place] the opportunity to be involved in your care.  Below is a brief recap of your visit and next steps.  We will plan to see you again in 3 months.  Summary No medication changes today Psychiatry referral placed and klonopin refilled Follow up in 3 months

## 2022-06-18 NOTE — Assessment & Plan Note (Signed)
Currently prescribed Fosamax.  This is managed by endocrinology.  Vitamin D and calcium levels were adequate on most recent labs.

## 2022-06-18 NOTE — Assessment & Plan Note (Signed)
Previously followed at [facility], however she reports today that her insurance will no longer cover services at this facility.  She is currently prescribed Wellbutrin SR, Klonopin, Lamictal, and Cymbalta.  Her mood remains stable.  She has requested a referral to psychiatry and additionally requests a refill of Klonopin. -PDMP reviewed and is appropriate.  Klonopin has been refilled today.  She has signed a controlled substance agreement. -Psychiatry referral placed

## 2022-06-19 ENCOUNTER — Encounter: Payer: Self-pay | Admitting: Gastroenterology

## 2022-06-20 DIAGNOSIS — R7989 Other specified abnormal findings of blood chemistry: Secondary | ICD-10-CM | POA: Diagnosis not present

## 2022-06-20 DIAGNOSIS — E0590 Thyrotoxicosis, unspecified without thyrotoxic crisis or storm: Secondary | ICD-10-CM | POA: Diagnosis not present

## 2022-06-20 DIAGNOSIS — M85852 Other specified disorders of bone density and structure, left thigh: Secondary | ICD-10-CM | POA: Diagnosis not present

## 2022-06-21 LAB — COMPREHENSIVE METABOLIC PANEL
ALT: 26 IU/L (ref 0–32)
AST: 24 IU/L (ref 0–40)
Albumin/Globulin Ratio: 2
Albumin: 4.2 g/dL (ref 3.9–4.9)
Alkaline Phosphatase: 93 IU/L (ref 44–121)
BUN/Creatinine Ratio: 19 (ref 12–28)
BUN: 26 mg/dL (ref 8–27)
Bilirubin Total: 0.3 mg/dL (ref 0.0–1.2)
CO2: 22 mmol/L (ref 20–29)
Calcium: 10.4 mg/dL — ABNORMAL HIGH (ref 8.7–10.3)
Chloride: 105 mmol/L (ref 96–106)
Creatinine, Ser: 1.39 mg/dL — ABNORMAL HIGH (ref 0.57–1.00)
Globulin, Total: 2.1 g/dL (ref 1.5–4.5)
Glucose: 92 mg/dL (ref 70–99)
Potassium: 5.6 mmol/L — ABNORMAL HIGH (ref 3.5–5.2)
Sodium: 138 mmol/L (ref 134–144)
Total Protein: 6.3 g/dL (ref 6.0–8.5)
eGFR: 42 mL/min/{1.73_m2} — ABNORMAL LOW (ref >59)

## 2022-06-21 LAB — THYROID PEROXIDASE ANTIBODY: Thyroperoxidase Ab SerPl-aCnc: <9 IU/mL (ref 0–34)

## 2022-06-21 LAB — MAGNESIUM: Magnesium: 1.7 mg/dL (ref 1.6–2.3)

## 2022-06-21 LAB — PTH, INTACT AND CALCIUM: PTH: 35 pg/mL (ref 15–65)

## 2022-06-21 LAB — VITAMIN D 25 HYDROXY (VIT D DEFICIENCY, FRACTURES): Vit D, 25-Hydroxy: 44.5 ng/mL (ref 30.0–100.0)

## 2022-06-21 LAB — TSH: TSH: 0.972 u[IU]/mL (ref 0.450–4.500)

## 2022-06-21 LAB — PHOSPHORUS: Phosphorus: 3.2 mg/dL (ref 3.0–4.3)

## 2022-06-21 LAB — THYROGLOBULIN ANTIBODY: Thyroglobulin Antibody: <1 IU/mL (ref 0.0–0.9)

## 2022-06-21 LAB — T3, FREE: T3, Free: 2.1 pg/mL (ref 2.0–4.4)

## 2022-06-21 LAB — T4, FREE: Free T4: 0.95 ng/dL (ref 0.82–1.77)

## 2022-06-23 ENCOUNTER — Other Ambulatory Visit: Payer: Self-pay | Admitting: Internal Medicine

## 2022-06-27 ENCOUNTER — Encounter: Payer: Self-pay | Admitting: "Endocrinology

## 2022-06-27 ENCOUNTER — Ambulatory Visit (INDEPENDENT_AMBULATORY_CARE_PROVIDER_SITE_OTHER): Payer: Medicare Other | Admitting: "Endocrinology

## 2022-06-27 VITALS — BP 122/68 | HR 56 | Ht 66.0 in | Wt 152.0 lb

## 2022-06-27 DIAGNOSIS — M85852 Other specified disorders of bone density and structure, left thigh: Secondary | ICD-10-CM | POA: Diagnosis not present

## 2022-06-27 DIAGNOSIS — R7989 Other specified abnormal findings of blood chemistry: Secondary | ICD-10-CM | POA: Insufficient documentation

## 2022-06-27 NOTE — Progress Notes (Signed)
06/27/2022, 5:53 PM  Endocrinology follow-up note   Subjective:    Patient ID: Erica Gallegos, female    DOB: [birth year], PCP Billie Lade, MD   Past Medical History:  Diagnosis Date   Anxiety    Arthritis    Complication of anesthesia    Patient woke up during anesthesia with bunionectomy   GERD (gastroesophageal reflux disease)    HLD (hyperlipidemia)    HTN (hypertension)    Major depressive disorder    Migraine with aura    PONV (postoperative nausea and vomiting)    RAS (renal artery stenosis) (HCC)    Sleep apnea    Sleep related hypoxia    Spinal stenosis    Past Surgical History:  Procedure Laterality Date   BUNIONECTOMY     CATARACT EXTRACTION     CHOLECYSTECTOMY     COLONOSCOPY WITH PROPOFOL N/A 12/13/2021   Procedure: COLONOSCOPY WITH PROPOFOL;  Surgeon: Corbin Ade, MD;  [facility]: [facility];  Service: Endoscopy;  Laterality: N/A;  8:00am, asa 2   ESOPHAGOGASTRODUODENOSCOPY (EGD) WITH PROPOFOL N/A 04/05/2021   Procedure: ESOPHAGOGASTRODUODENOSCOPY (EGD) WITH PROPOFOL;  Surgeon: Corbin Ade, MD;  [facility]: [facility];  Service: Endoscopy;  Laterality: N/A;  10:15am   ESOPHAGOGASTRODUODENOSCOPY (EGD) WITH PROPOFOL N/A 12/13/2021   Procedure: ESOPHAGOGASTRODUODENOSCOPY (EGD) WITH PROPOFOL;  Surgeon: Corbin Ade, MD;  [facility]: [facility];  Service: Endoscopy;  Laterality: N/A;   MALONEY DILATION N/A 04/05/2021   Procedure: Elease Hashimoto DILATION;  Surgeon: Corbin Ade, MD;  [facility]: [facility];  Service: Endoscopy;  Laterality: N/A;   MALONEY DILATION N/A 12/13/2021   Procedure: Elease Hashimoto DILATION;  Surgeon: Corbin Ade, MD;  Location: [facility];  Service: Endoscopy;  Laterality: N/A;   Social History   Socioeconomic History   Marital status: Married    Spouse name: Not on file   Number of children: 3   Years of education: Not on file   Highest education level: Not on file   Occupational History   Not on file  Tobacco Use   Smoking status: Former    Types: Cigarettes   Smokeless tobacco: Never   Tobacco comments:    She quit smoking in the early 90's  Vaping Use   Vaping Use: Every day  Substance and Sexual Activity   Alcohol use: Yes    Comment: seldom   Drug use: Yes    Types: Marijuana    Comment: CBD cream; Marijuana a couple times a week. helps with her anxiety and migraines.   Sexual activity: Yes    Birth control/protection: None, Post-menopausal  Other Topics Concern   Not on file  Social History Narrative   Lives with her husband, retired.    Social Determinants of Health   Financial Resource Strain: Unknown (08/28/2020)   Overall Financial Resource Strain (CARDIA)    Difficulty of Paying Living Expenses: Patient declined  Food Insecurity: Unknown (08/28/2020)   Hunger Vital Sign    Worried About Running Out of Food in the Last Year: Patient declined    Ran Out of Food in the Last Year: Patient declined  Transportation Needs: Unknown (08/28/2020)   PRAPARE - Transportation    Lack  of Transportation (Medical): Patient declined    Lack of Transportation (Non-Medical): Patient declined  Physical Activity: Unknown (08/28/2020)   Exercise Vital Sign    Days of Exercise per Week: Patient declined    Minutes of Exercise per Session: Patient declined  Stress: Unknown (08/28/2020)   [employer] of Occupational Health - Occupational Stress Questionnaire    Feeling of Stress : Patient declined  Social Connections: Unknown (08/28/2020)   Social Connection and Isolation Panel [NHANES]    Frequency of Communication with Friends and Family: Patient declined    Frequency of Social Gatherings with Friends and Family: Patient declined    Attends Religious Services: Patient declined    Database administrator or Organizations: Patient declined    Attends Engineer, structural: Patient declined    Marital Status: Patient declined    Family History  Problem Relation Age of Onset   Cancer Mother    Hypertension Mother    Diabetes Mother    Heart disease Mother    Hyperlipidemia Mother    Cancer Father    Stroke Father    Hypertension Father    Heart disease Father    Hypertension Sister    Lupus Sister    Hypertension Sister    Colon polyps Sister    Cancer Brother    Brain cancer Brother    Non-Hodgkin's lymphoma Brother    Colon cancer Neg Hx    Outpatient Encounter Medications as of 06/27/2022  Medication Sig   alendronate (FOSAMAX) 70 MG tablet Take 1 tablet (70 mg total) by mouth every 7 (seven) days. Take with a full glass of water on an empty stomach.   atorvastatin (LIPITOR) 40 MG tablet TAKE 1 TABLET(40 MG) BY MOUTH DAILY   buPROPion (WELLBUTRIN SR) 200 MG 12 hr tablet Take 200 mg by mouth 2 (two) times daily.   Calcium Carb-Cholecalciferol (CALTRATE 600+D3) 600-20 MG-MCG TABS Take 600 mg by mouth daily. (Patient taking differently: Take 1 tablet by mouth daily.)   carvedilol (COREG) 12.5 MG tablet TAKE 1 TABLET(12.5 MG) BY MOUTH TWICE DAILY   chlorthalidone (HYGROTON) 25 MG tablet TAKE 1 AND 1/2 TABLETS(37.5 MG) BY MOUTH DAILY   Cholecalciferol (VITAMIN D3) 50 MCG (2000 UT) TABS Take 2,000 Units by mouth daily.   clonazePAM (KLONOPIN) 0.5 MG tablet Take 1 tablet (0.5 mg total) by mouth daily as needed for anxiety (Sleep).   cyclobenzaprine (FLEXERIL) 10 MG tablet TAKE 1 TABLET BY MOUTH EVERY NIGHT AT BEDTIME AS NEEDED FOR BACK OR NERVE PAIN   diclofenac (VOLTAREN) 50 MG EC tablet Take 50 mg by mouth 2 (two) times daily as needed for mild pain or moderate pain.   DULoxetine (CYMBALTA) 60 MG capsule Take 60 mg by mouth 2 (two) times daily.   esomeprazole (NEXIUM) 20 MG capsule Take 1 capsule (20 mg total) by mouth daily at 12 noon.   ezetimibe (ZETIA) 10 MG tablet TAKE 1 TABLET(10 MG) BY MOUTH DAILY   gabapentin (NEURONTIN) 300 MG capsule TAKE 1 CAPSULE BY MOUTH EVERY MORNING AND EVERY EVENING THEN  TAKE 2 CAPSULES BY MOUTH AT BEDTIME   hydrALAZINE (APRESOLINE) 100 MG tablet TAKE 1 TABLET(100 MG) BY MOUTH TWICE DAILY   lamoTRIgine (LAMICTAL) 25 MG tablet Take 2 tablets (50 mg total) by mouth daily. (Patient taking differently: Take 25-50 mg by mouth See admin instructions. Take 25 mg in the morning and 50 mg at bedtime)   lisinopril (ZESTRIL) 40 MG tablet TAKE 1 TABLET(40 MG) BY MOUTH  DAILY   Melatonin 10 MG TABS Take 10 mg by mouth at bedtime.   Omega-3 Fatty Acids (FISH OIL) 1000 MG CAPS Take 4,000 mg by mouth daily.   spironolactone (ALDACTONE) 25 MG tablet TAKE 1 TABLET(25 MG) BY MOUTH DAILY   vitamin B-12 (CYANOCOBALAMIN) 500 MCG tablet Take 500 mcg by mouth daily.   vitamin C (ASCORBIC ACID) 500 MG tablet Take 500 mg by mouth daily.   No facility-administered encounter medications on file as of 06/27/2022.   ALLERGIES: Allergies  Allergen Reactions   Atenolol Swelling   Other Other (See Comments)    Do not eat meat   Amlodipine Swelling    VACCINATION STATUS: Immunization History  Administered Date(s) Administered   Influenza,inj,Quad PF,6+ Mos 10/13/2020, 10/22/2021   Moderna Covid-19 Vaccine Bivalent Booster 77yrs & up 07/07/2020, 10/16/2020   Moderna SARS-COV2 Booster Vaccination 07/28/2019   Moderna Sars-Covid-2 Vaccination 11/23/2019   PFIZER(Purple Top)SARS-COV-2 Vaccination 03/12/2019, 04/02/2019   PNEUMOCOCCAL CONJUGATE-20 06/18/2022   Tdap 07/24/2021   Zoster Recombinat (Shingrix) 07/24/2021, 10/22/2021    HPI Erica Gallegos is 67 y.o. female who presents today with a medical history as above. she is being seen in follow-up after she was seen in consultation for osteopenia requested by Billie Lade, MD.  History is obtained from the patient.  She was never diagnosed with osteoporosis.  Her most recent bone density done in October 2023 showed a T-score of -2.3 on her hip, -1.7 on femur, and -1.2 on forearms.  Her spine was not included in the study due to  degenerative joint disease. Patient thinks she has lost some height, does have medical history of scoliosis.  She is on vitamin D and calcium supplements.  Her most recent labs show mild hypocalcemia.  She denies any history of fragility fractures.  Her medical problems include hypertension and hyperlipidemia on treatment. Her intake of dairy products is out of average.  She was diffuse body aches and neuropathy/radiculopathies.  She is more than 15 years postmenopausal, a former smoker.   Review of Systems  Constitutional: + Minimally fluctuating body weight, + fatigue, no subjective hyperthermia, no subjective hypothermia Eyes: no blurry vision, no xerophthalmia   Objective:       06/27/2022   11:22 AM 06/18/2022   11:02 AM 03/14/2022    9:30 AM  Vitals with BMI  Height 5\' 6"  5\' 6"  5\' 6"   Weight 152 lbs 150 lbs 13 oz 156 lbs  BMI 24.55 24.35 25.19  Systolic 122 111 657  Diastolic 68 65 80  Pulse 56 67 54    BP 122/68   Pulse (!) 56   Ht 5\' 6"  (1.676 m)   Wt 152 lb (68.9 kg)   BMI 24.53 kg/m   Wt Readings from Last 3 Encounters:  06/27/22 152 lb (68.9 kg)  06/18/22 150 lb 12.8 oz (68.4 kg)  03/14/22 156 lb (70.8 kg)    Physical Exam  Constitutional:  Body mass index is 24.53 kg/m.,  not in acute distress, normal state of mind Eyes: PERRLA, EOMI, no exophthalmos ENT: moist mucous membranes, no gross thyromegaly, no gross cervical lymphadenopathy  Musculoskeletal: + Mild scoliotic deformity of the spine which was not included in the bone density , strength intact in all four extremities   CMP ( most recent) CMP     Component Value Date/Time   NA 138 06/20/2022 1008   K 5.6 (H) 06/20/2022 1008   CL 105 06/20/2022 1008   CO2 22 06/20/2022 1008  GLUCOSE 92 06/20/2022 1008   GLUCOSE 82 12/04/2021 1106   BUN 26 06/20/2022 1008   CREATININE 1.39 (H) 06/20/2022 1008   CREATININE 1.28 (H) 08/10/2021 1209   CALCIUM 10.4 (H) 06/20/2022 1008   PROT 6.3 06/20/2022 1008    ALBUMIN 4.2 06/20/2022 1008   AST 24 06/20/2022 1008   ALT 26 06/20/2022 1008   ALKPHOS 93 06/20/2022 1008   BILITOT 0.3 06/20/2022 1008   GFRNONAA 36 (L) 12/04/2021 1106    Lipid Panel     Component Value Date/Time   CHOL 125 02/26/2022 1110   TRIG 153 (H) 02/26/2022 1110   HDL 46 02/26/2022 1110   CHOLHDL 2.7 02/26/2022 1110   CHOLHDL 2.2 08/10/2021 1209   LDLCALC 53 02/26/2022 1110   LDLCALC 56 08/10/2021 1209   LABVLDL 26 02/26/2022 1110      Lab Results  Component Value Date   TSH 0.972 06/20/2022   TSH 1.720 02/26/2022   TSH 1.260 10/22/2021   TSH 1.45 09/21/2020   TSH 1.93 08/28/2020   FREET4 0.95 06/20/2022   FREET4 1.00 02/26/2022   FREET4 0.74 (L) 10/22/2021      Assessment & Plan:   1. Osteopenia of neck of left femur 2. Abnormal thyroid blood test 3.  Hyperglycemia  - I have reviewed her available  records and clinically evaluated the patient. - Based on these reviews, she has osteopenia with significant decrease in bone mass to a T-score of -2.3 on left hip. -She has mild scoliotic deformity, loss of height, denies fragility fractures. Considering her risk, she was approached for early initiation of bisphosphonates to which she agreed.  She is tolerating Fosamax 70 mg p.o. weekly.  She is advised to continue with plan to repeat her bone density before her next visit. Fall precautions discussed with her.  Her dietary intake of protein seems to be adequate.   Her repeat for 6 thyroid function test consistent with euthyroid state that she will not need intervention with thyroid hormone. She was also found to have mild hypercalcemia associated with normal PTH.  She is advised to discontinue her calcium supplements, however continue on vitamin D supplement.  - she is advised to maintain close follow up with Durwin Nora Lucina Mellow, MD for primary care needs.  I spent  25  minutes in the care of the patient today including review of labs from Thyroid Function,  CMP, and other relevant labs ; imaging/biopsy records (current and previous including abstractions from other facilities); face-to-face time discussing  her lab results and symptoms, medications doses, her options of short and long term treatment based on the latest standards of care / guidelines;   and documenting the encounter.  Erica Gallegos  participated in the discussions, expressed understanding, and voiced agreement with the above plans.  All questions were answered to her satisfaction. she is encouraged to contact clinic should she have any questions or concerns prior to her return visit.  Follow up plan: Return in about 6 months (around 12/27/2022) for F/U with Pre-visit Labs, DXA Scan B4 NV.   Marquis Lunch, MD [facility] [address] [place], [place] [address] Phone: [phone]  Fax: [phone]     06/27/2022, 5:53 PM  This note was partially dictated with voice recognition software. Similar sounding words can be transcribed inadequately or may not  be corrected upon review.

## 2022-07-02 ENCOUNTER — Other Ambulatory Visit: Payer: Self-pay | Admitting: Nurse Practitioner

## 2022-07-03 ENCOUNTER — Other Ambulatory Visit: Payer: Self-pay | Admitting: Internal Medicine

## 2022-07-05 DIAGNOSIS — Z6825 Body mass index (BMI) 25.0-25.9, adult: Secondary | ICD-10-CM | POA: Diagnosis not present

## 2022-07-05 DIAGNOSIS — M4316 Spondylolisthesis, lumbar region: Secondary | ICD-10-CM | POA: Diagnosis not present

## 2022-07-05 DIAGNOSIS — M8580 Other specified disorders of bone density and structure, unspecified site: Secondary | ICD-10-CM | POA: Diagnosis not present

## 2022-07-08 ENCOUNTER — Encounter: Payer: Self-pay | Admitting: Nurse Practitioner

## 2022-07-08 ENCOUNTER — Ambulatory Visit: Payer: Medicare Other | Attending: Cardiology | Admitting: Nurse Practitioner

## 2022-07-08 VITALS — BP 130/70 | HR 98 | Ht 66.0 in | Wt 151.6 lb

## 2022-07-08 DIAGNOSIS — E785 Hyperlipidemia, unspecified: Secondary | ICD-10-CM | POA: Diagnosis not present

## 2022-07-08 DIAGNOSIS — G4733 Obstructive sleep apnea (adult) (pediatric): Secondary | ICD-10-CM

## 2022-07-08 DIAGNOSIS — I1A0 Resistant hypertension: Secondary | ICD-10-CM

## 2022-07-08 DIAGNOSIS — R6889 Other general symptoms and signs: Secondary | ICD-10-CM

## 2022-07-08 DIAGNOSIS — L720 Epidermal cyst: Secondary | ICD-10-CM | POA: Diagnosis not present

## 2022-07-08 NOTE — Patient Instructions (Addendum)
Medication Instructions:  Your physician recommends that you continue on your current medications as directed. Please refer to the Current Medication list given to you today.  Labwork: none  Testing/Procedures: none  Follow-Up: Your physician recommends that you schedule a follow-up appointment in: 6 Months with Dr.Branch  Any Other Special Instructions Will Be Listed Below (If Applicable).  If you need a refill on your cardiac medications before your next appointment, please call your pharmacy.

## 2022-07-08 NOTE — Progress Notes (Signed)
  Cardiology Office Note:  .   Date:  07/08/2022  ID:  Erica Gallegos, DOB 08/11/1955, MRN [id number] PCP: Billie Lade, MD  [facility] Providers Cardiologist:  Dina Rich, MD    History of Present Illness: Erica Gallegos is a 67 y.o. female with a PMH of bradycardia, resistant HTN, HLD, GERD, sleep apnea, and MDD, who presents today for 6 month follow-up.   I last saw this patient on January 15, 2022. Was doing well, compliant with CPAP.   Today she presents for follow-up. She states since I last saw her, she has had chronic back issues, now using front rolling walker. Does admit to [facility] and occasional forgetfulness, has noticed small "bump" under right axilla. Denies any chest pain, shortness of breath, palpitations, syncope, presyncope, dizziness, orthopnea, PND, swelling or significant weight changes, acute bleeding, or claudication.  SH: Retired Nutritional therapist     Physical Exam:   VS:  BP 130/70   Pulse 98   Ht 5\' 6"  (1.676 m)   Wt 151 lb 9.6 oz (68.8 kg)   SpO2 (!) 80%   BMI 24.47 kg/m    Wt Readings from Last 3 Encounters:  07/08/22 151 lb 9.6 oz (68.8 kg)  06/27/22 152 lb (68.9 kg)  06/18/22 150 lb 12.8 oz (68.4 kg)    GEN: Well nourished, well developed in no acute distress NECK: No JVD; No carotid bruits CARDIAC: S1/S2, RRR, no murmurs, rubs, gallops RESPIRATORY:  Clear to auscultation without rales, wheezing or rhonchi  ABDOMEN: Soft, non-tender, non-distended EXTREMITIES:  No edema; No deformity   ASSESSMENT AND PLAN: .    Resistant hypertension Blood pressure stable. Discussed to monitor BP at home at least 2 hours after medications and sitting for 5-10 minutes.  Continue current medication regimen.  She will contact us if her BP remains elevated. Heart healthy diet and regular cardiovascular exercise encouraged.    2. HLD LDL 53 02/2022.  She is currently at goal.  Continue current medication regimen. Heart healthy diet and regular  cardiovascular exercise encouraged.    3. OSA on CPAP Compliant with CPAP usage. Continue to follow-up with Dr. Craige Cotta.   4. Right cyst under right axilla Small in size. Etiology could be sebaceous cyst. However etiology unclear. Recommend follow-up with PCP and OBGYN.   5. Forgetfulness Etiology unclear. Recommend evaluation with PCP at next OV.   Dispo: Follow-up with Dr. Dina Rich or [facility] in 6 months or sooner if anything changes.   Signed, Sharlene Dory, NP

## 2022-07-15 ENCOUNTER — Other Ambulatory Visit: Payer: Self-pay | Admitting: Internal Medicine

## 2022-07-21 ENCOUNTER — Other Ambulatory Visit: Payer: Self-pay | Admitting: Internal Medicine

## 2022-07-22 ENCOUNTER — Ambulatory Visit: Payer: BC Managed Care – PPO | Admitting: Cardiology

## 2022-07-22 DIAGNOSIS — M48062 Spinal stenosis, lumbar region with neurogenic claudication: Secondary | ICD-10-CM | POA: Diagnosis not present

## 2022-07-28 ENCOUNTER — Other Ambulatory Visit: Payer: Self-pay | Admitting: Internal Medicine

## 2022-07-30 ENCOUNTER — Encounter: Payer: Self-pay | Admitting: Gastroenterology

## 2022-07-30 ENCOUNTER — Other Ambulatory Visit: Payer: Self-pay | Admitting: Internal Medicine

## 2022-07-30 ENCOUNTER — Ambulatory Visit: Payer: Medicare Other | Admitting: Gastroenterology

## 2022-07-30 VITALS — BP 139/79 | HR 62 | Temp 97.8°F | Ht 66.0 in | Wt 144.2 lb

## 2022-07-30 DIAGNOSIS — K5900 Constipation, unspecified: Secondary | ICD-10-CM | POA: Diagnosis not present

## 2022-07-30 DIAGNOSIS — K219 Gastro-esophageal reflux disease without esophagitis: Secondary | ICD-10-CM | POA: Diagnosis not present

## 2022-07-30 DIAGNOSIS — F419 Anxiety disorder, unspecified: Secondary | ICD-10-CM

## 2022-07-30 NOTE — Progress Notes (Signed)
GI Office Note    Referring Provider: Billie Lade, MD Primary Care Physician:  Billie Lade, MD Primary Gastroenterologist: [name].Rourk, MD  Date:  07/30/2022  ID:  Forest, Pruden Dec 15, 1955, MRN [id number]   Chief Complaint   Chief Complaint  Patient presents with   Follow-up    Follow up. No problems   History of Present Illness  Erica Gallegos is a 67 y.o. female with a history of anxiety, depression, renal artery stenosis, GERD, HLD, HTN, sleep apnea, schatzki ring previously requiring dilations presenting today for follow up.  Office visit 11/13/21. Underwent dilation of schatzki ring early 2023. Having recurrent dysphagia and lansoprazole not controlling GERD. Denied melena or brbpr. H/o fatty liver with normal LFTs. Patient lost 22 lbs from March to this office visit. Reported her sister had colon polyps and patient stated she had a colonoscopy 5 years prior that was negative. She was offered and scheduled for a colonoscopy. BPE ordered.    BPE 11/14/21: -narrowing GE junction and obstructed 12.22mm barium tablet. Simple stricture -tiny amount of unwitnessed aspiration of contrast with spontaneous cough reflex.    EGD 12/13/21: -non critical Schatzki ring s/p dilation up to 60 Fr and forcep disruption of ring -if recurrent dysphagia would recommend manometry -Advised Nexium BID   Colonoscopy 12/13/21:  -redundant colon -internal hemorrhoids, non bleeding -Repeat colonoscopy in 5 years  Last office visit 03/14/22.  Has been forgetting to take Nexium because she thought she had to take it at lunchtime at noon.  At times feel sick to her stomach and causes nausea.  Has been taking her vitamin D.  At times feels liquids stuck when she is swallowing and feels like she will gag.  Happened twice last week with sodas.  Reports balls of stools at times but not very hard.  BM twice per week.  Denies any bloating or abdominal pain.  Advised to start Nexium 20 mg in  the morning, 30 minutes prior to breakfast, could consider increasing to 40 mg if not much improvement.  Advised if swallowing issues progressed that we should proceed with manometry versus speech eval.  She was advised continue vitamin D supplementation start fiber supplementation to help with constipation.  Today: Constipation - Does not seem to be having as much constipation. She is unsure if this is diet related. Has bene eating more fruits like cherries, grapes, watermelon, peaches and cottage cheese. Has also been eating more salads. Has been going every 2-3 days now and doing much better. Still has some little balls of stool but depends on hydration. Has more normal stools when she is adequately hydrated.   GERD - Been doing really well but breakthrough episodes are occurring with soda. Will have a big belch after sodas. No burning has occurred. No more issues with dysphagia. She states her appetite has changed some. Just not as hungry as she used to. Working on freezing veggies and such now.   Working on getting a bone density scan done so she can see if she is a candidate to get back surgery. Has not gotten a response from her doctor. She got a bone density last year and has been taking calcium and wants a recheck so she can have surgery. Got a steroid shot in her back last week.   Current Outpatient Medications  Medication Sig Dispense Refill   alendronate (FOSAMAX) 70 MG tablet Take 1 tablet (70 mg total) by mouth every 7 (seven) days. Take with  a full glass of water on an empty stomach. 12 tablet 3   atorvastatin (LIPITOR) 40 MG tablet TAKE 1 TABLET(40 MG) BY MOUTH DAILY 90 tablet 3   buPROPion (WELLBUTRIN SR) 200 MG 12 hr tablet TAKE 1 TABLET(200 MG) BY MOUTH TWICE DAILY 60 tablet 0   carvedilol (COREG) 12.5 MG tablet TAKE 1 TABLET(12.5 MG) BY MOUTH TWICE DAILY 180 tablet 0   chlorthalidone (HYGROTON) 25 MG tablet TAKE 1 AND 1/2 TABLETS(37.5 MG) BY MOUTH DAILY 135 tablet 3    Cholecalciferol (VITAMIN D3) 50 MCG (2000 UT) TABS Take 2,000 Units by mouth daily.     clonazePAM (KLONOPIN) 0.5 MG tablet TAKE 1 TABLET(0.5 MG) BY MOUTH DAILY AS NEEDED FOR ANXIETY OR SLEEP 30 tablet 0   cyclobenzaprine (FLEXERIL) 10 MG tablet TAKE 1 TABLET BY MOUTH EVERY NIGHT AT BEDTIME AS NEEDED FOR BACK OR NERVE PAIN 30 tablet 0   diclofenac (VOLTAREN) 50 MG EC tablet Take 50 mg by mouth 2 (two) times daily as needed for mild pain or moderate pain.     DULoxetine (CYMBALTA) 60 MG capsule Take 60 mg by mouth 2 (two) times daily.     esomeprazole (NEXIUM) 20 MG capsule Take 1 capsule (20 mg total) by mouth daily at 12 noon. 90 capsule 1   ezetimibe (ZETIA) 10 MG tablet TAKE 1 TABLET(10 MG) BY MOUTH DAILY 90 tablet 1   gabapentin (NEURONTIN) 300 MG capsule TAKE 1 CAPSULE BY MOUTH EVERY MORNING AND EVERY EVENING THEN TAKE 2 CAPSULES BY MOUTH AT BEDTIME 360 capsule 0   hydrALAZINE (APRESOLINE) 100 MG tablet TAKE 1 TABLET(100 MG) BY MOUTH TWICE DAILY 180 tablet 0   lamoTRIgine (LAMICTAL) 25 MG tablet Take 2 tablets (50 mg total) by mouth daily. (Patient taking differently: Take 25-50 mg by mouth See admin instructions. Take 25 mg in the morning and 50 mg at bedtime) 60 tablet 3   lisinopril (ZESTRIL) 40 MG tablet TAKE 1 TABLET(40 MG) BY MOUTH DAILY 90 tablet 1   Melatonin 10 MG TABS Take 10 mg by mouth at bedtime.     Omega-3 Fatty Acids (FISH OIL) 1000 MG CAPS Take 4,000 mg by mouth daily.     spironolactone (ALDACTONE) 25 MG tablet TAKE 1 TABLET(25 MG) BY MOUTH DAILY 90 tablet 1   vitamin B-12 (CYANOCOBALAMIN) 500 MCG tablet Take 500 mcg by mouth daily.     vitamin C (ASCORBIC ACID) 500 MG tablet Take 500 mg by mouth daily.     No current facility-administered medications for this visit.    Past Medical History:  Diagnosis Date   Anxiety    Arthritis    Complication of anesthesia    Patient woke up during anesthesia with bunionectomy   GERD (gastroesophageal reflux disease)    HLD  (hyperlipidemia)    HTN (hypertension)    Major depressive disorder    Migraine with aura    PONV (postoperative nausea and vomiting)    RAS (renal artery stenosis) (HCC)    Sleep apnea    Sleep related hypoxia    Spinal stenosis     Past Surgical History:  Procedure Laterality Date   BUNIONECTOMY     CATARACT EXTRACTION     CHOLECYSTECTOMY     COLONOSCOPY WITH PROPOFOL N/A 12/13/2021   Procedure: COLONOSCOPY WITH PROPOFOL;  Surgeon: Corbin Ade, MD;  [facility];  Service: Endoscopy;  Laterality: N/A;  8:00am, asa 2   ESOPHAGOGASTRODUODENOSCOPY (EGD) WITH PROPOFOL N/A 04/05/2021   Procedure:  ESOPHAGOGASTRODUODENOSCOPY (EGD) WITH PROPOFOL;  Surgeon: Corbin Ade, MD;  [facility];  Service: Endoscopy;  Laterality: N/A;  10:15am   ESOPHAGOGASTRODUODENOSCOPY (EGD) WITH PROPOFOL N/A 12/13/2021   Procedure: ESOPHAGOGASTRODUODENOSCOPY (EGD) WITH PROPOFOL;  Surgeon: Corbin Ade, MD;  [facility];  Service: Endoscopy;  Laterality: N/A;   MALONEY DILATION N/A 04/05/2021   Procedure: Elease Hashimoto DILATION;  Surgeon: Corbin Ade, MD;  [facility];  Service: Endoscopy;  Laterality: N/A;   MALONEY DILATION N/A 12/13/2021   Procedure: Elease Hashimoto DILATION;  Surgeon: Corbin Ade, MD;  [facility];  Service: Endoscopy;  Laterality: N/A;    Family History  Problem Relation Age of Onset   Cancer Mother    Hypertension Mother    Diabetes Mother    Heart disease Mother    Hyperlipidemia Mother    Cancer Father    Stroke Father    Hypertension Father    Heart disease Father    Hypertension Sister    Lupus Sister    Hypertension Sister    Colon polyps Sister    Cancer Brother    Brain cancer Brother    Non-Hodgkin's lymphoma Brother    Colon cancer Neg Hx     Allergies as of 07/30/2022 - Review Complete 07/30/2022  Allergen Reaction Noted   Atenolol Swelling 07/24/2021   Amlodipine Swelling 08/28/2020    Social History    Socioeconomic History   Marital status: Married    Spouse name: Not on file   Number of children: 3   Years of education: Not on file   Highest education level: Not on file  Occupational History   Not on file  Tobacco Use   Smoking status: Former    Types: Cigarettes   Smokeless tobacco: Never   Tobacco comments:    She quit smoking in the early 90's  Vaping Use   Vaping status: Every Day  Substance and Sexual Activity   Alcohol use: Yes    Comment: seldom   Drug use: Yes    Types: Marijuana    Comment: CBD cream; Marijuana a couple times a week. helps with her anxiety and migraines.   Sexual activity: Yes    Birth control/protection: None, Post-menopausal  Other Topics Concern   Not on file  Social History Narrative   Lives with her husband, retired.    Social Determinants of Health   Financial Resource Strain: Unknown (08/28/2020)   Overall Financial Resource Strain (CARDIA)    Difficulty of Paying Living Expenses: Patient declined  Food Insecurity: Unknown (08/28/2020)   Hunger Vital Sign    Worried About Running Out of Food in the Last Year: Patient declined    Ran Out of Food in the Last Year: Patient declined  Transportation Needs: Unknown (08/28/2020)   PRAPARE - Administrator, Civil Service (Medical): Patient declined    Lack of Transportation (Non-Medical): Patient declined  Physical Activity: Unknown (08/28/2020)   Exercise Vital Sign    Days of Exercise per Week: Patient declined    Minutes of Exercise per Session: Patient declined  Stress: Unknown (08/28/2020)   [employer] of Occupational Health - Occupational Stress Questionnaire    Feeling of Stress : Patient declined  Social Connections: Unknown (08/28/2020)   Social Connection and Isolation Panel [NHANES]    Frequency of Communication with Friends and Family: Patient declined    Frequency of Social Gatherings with Friends and Family: Patient declined  Attends Religious Services:  Patient declined    Active Member of Clubs or Organizations: Patient declined    Attends Banker Meetings: Patient declined    Marital Status: Patient declined     Review of Systems   Gen: Denies fever, chills, anorexia. Denies fatigue, weakness, weight loss.  CV: Denies chest pain, palpitations, syncope, peripheral edema, and claudication. Resp: Denies dyspnea at rest, cough, wheezing, coughing up blood, and pleurisy. GI: See HPI Derm: Denies rash, itching, dry skin Psych: Denies depression, anxiety, memory loss, confusion. No homicidal or suicidal ideation.  Heme: Denies bruising, bleeding, and enlarged lymph nodes.   Physical Exam   BP 139/79 (BP Location: Right Arm, Patient Position: Sitting, Cuff Size: Normal)   Pulse 62   Temp 97.8 F (36.6 C) (Temporal)   Ht 5\' 6"  (1.676 m)   Wt 144 lb 3.2 oz (65.4 kg)   SpO2 96%   BMI 23.27 kg/m   General:   Alert and oriented. No distress noted. Pleasant and cooperative.  Head:  Normocephalic and atraumatic. Eyes:  Conjuctiva clear without scleral icterus. Mouth:  Oral mucosa pink and moist. Good dentition. No lesions. Lungs:  Clear to auscultation bilaterally. No wheezes, rales, or rhonchi. No distress.  Heart:  S1, S2 present without murmurs appreciated.  Abdomen:  +BS, soft, non-tender and non-distended. No rebound or guarding. No HSM or masses noted. Rectal: deferred Msk:  unsteady gait. Uses rolling walker.  Extremities:  Without edema. Neurologic:  Alert and  oriented x4 Psych:  Alert and cooperative. Normal mood and affect.   Assessment  Erica Gallegos is a 67 y.o. female with a history of anxiety, depression, renal artery stenosis, GERD, HLD, HTN, sleep apnea, Schatzki ring previously requiring dilations presenting today for follow up.   Constipation: Much improved with higher fiber diet.  Still occasionally has some harder stools but usually related to dehydration/inadequate water intake.  Advised if  things were to continue that she could use stool softener as needed.  GERD, dysphagia: Improved with change in Nexium to 20 mg once daily prior to breakfast.  Trigger for breakthrough symptoms appears to be soda which she has been trying to avoid.  Continues to have somewhat of a lack of appetite but weight is stable.  No longer having any issues with dysphagia.  For now we will continue low-dose PPI.  PLAN   Continue Nexium 20 mg daily GERD diet Continue high fiber diet Can use stool softener as needed Follow up in 6 months  Brooke Bonito, MSN, FNP-BC, AGACNP-BC [facility]

## 2022-07-30 NOTE — Patient Instructions (Addendum)
Continue Nexium 20 mg once daily.   Continue your high fiber diet. If you are struggling with harder stools despite adequate hydration you can consider stool softener.   We will follow up in 6 months, sooner if needed. If I don't see you prior to the holidays I hope you enjoy the rest of the summer and Thanksgiving/Christmas!   It was a pleasure to see you today! I want to create trusting relationships with patients. If you receive a survey regarding your visit,  I greatly appreciate you taking time to fill this out on paper or through your MyChart. I value your feedback.  Brooke Bonito, MSN, FNP-BC, AGACNP-BC [facility]

## 2022-08-10 ENCOUNTER — Other Ambulatory Visit: Payer: Self-pay | Admitting: Internal Medicine

## 2022-08-12 ENCOUNTER — Other Ambulatory Visit: Payer: Self-pay | Admitting: Internal Medicine

## 2022-08-12 ENCOUNTER — Other Ambulatory Visit (HOSPITAL_COMMUNITY): Payer: Self-pay | Admitting: Internal Medicine

## 2022-08-12 DIAGNOSIS — Z1231 Encounter for screening mammogram for malignant neoplasm of breast: Secondary | ICD-10-CM

## 2022-08-16 ENCOUNTER — Ambulatory Visit (HOSPITAL_COMMUNITY)
Admission: RE | Admit: 2022-08-16 | Discharge: 2022-08-16 | Disposition: A | Discharge status: Home / Self Care | Payer: Medicare Other | Source: Ambulatory Visit | Attending: "Endocrinology | Admitting: "Endocrinology

## 2022-08-16 DIAGNOSIS — M810 Age-related osteoporosis without current pathological fracture: Secondary | ICD-10-CM | POA: Diagnosis not present

## 2022-08-16 DIAGNOSIS — Z780 Asymptomatic menopausal state: Secondary | ICD-10-CM | POA: Diagnosis not present

## 2022-08-16 DIAGNOSIS — M85852 Other specified disorders of bone density and structure, left thigh: Secondary | ICD-10-CM | POA: Insufficient documentation

## 2022-08-18 IMAGING — US US ABDOMEN COMPLETE
1 series · 14 of 25 positions shown · non-contrast
Comparison: None.

CLINICAL DATA: Elevated LFTs

EXAM:
ABDOMEN ULTRASOUND COMPLETE

[Series 1: us abdomen complete · 14 of 93 slices shown]
[im 1/93]
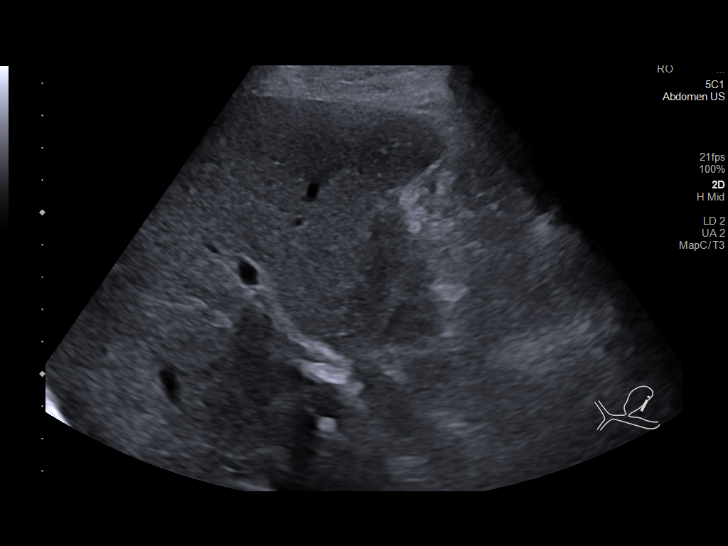
[im 8/93]
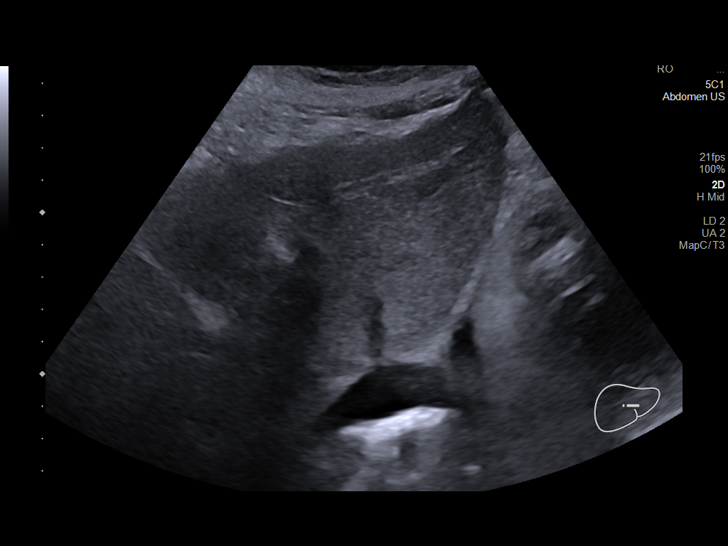
[im 16/93]
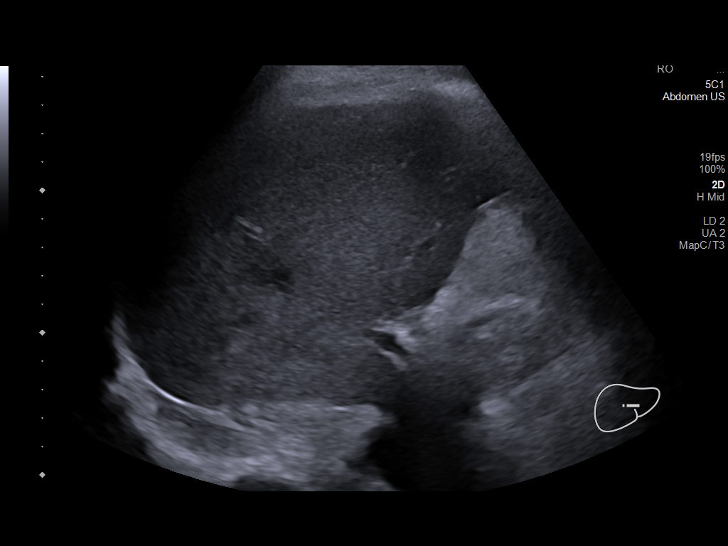
[im 24/93]
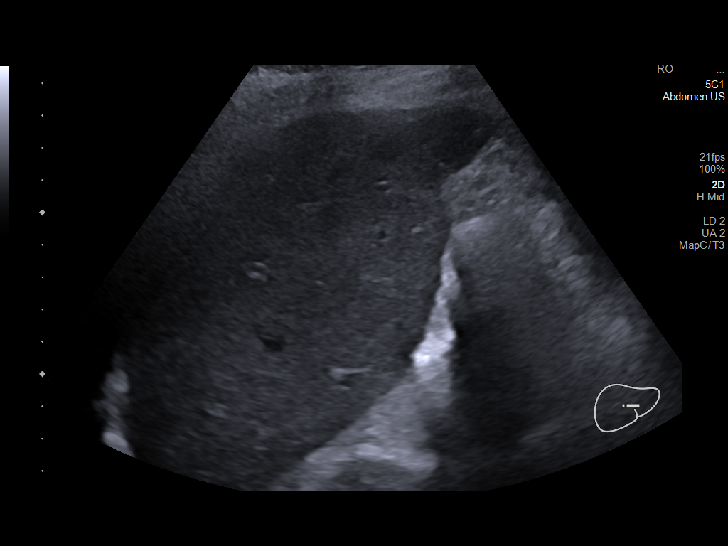
[im 31/93]
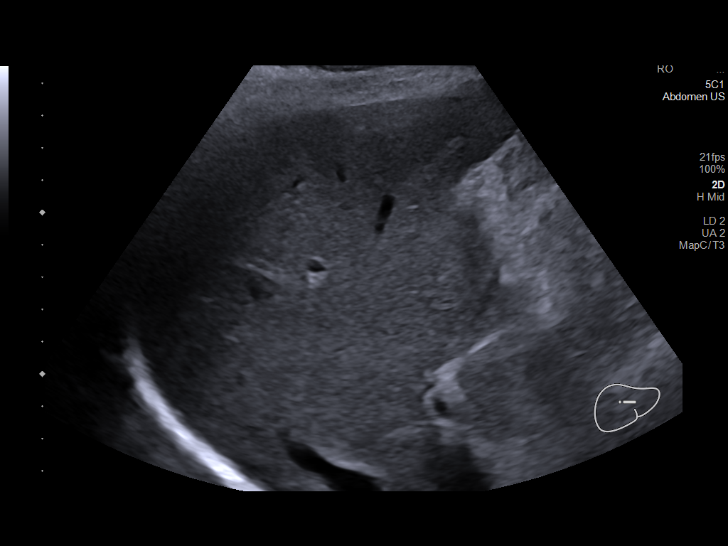
[im 35/93]
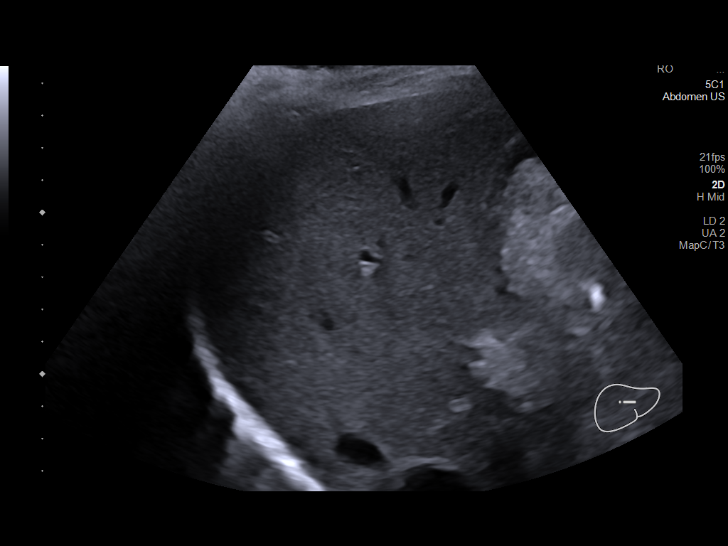
[im 43/93]
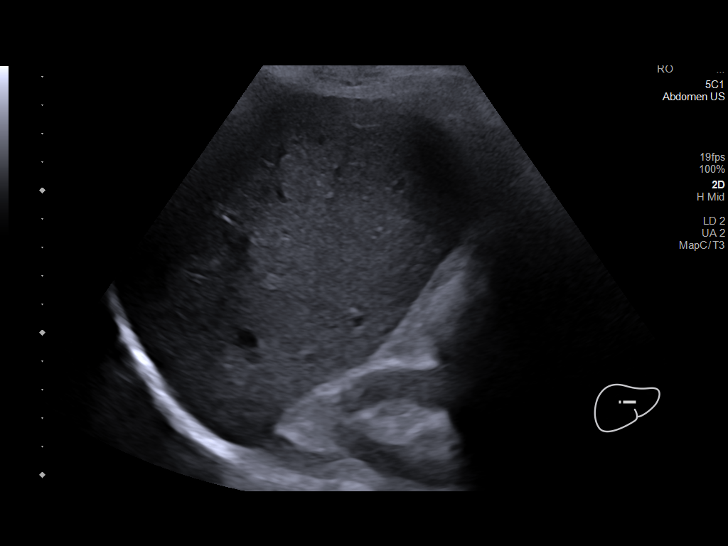
[im 50/93]
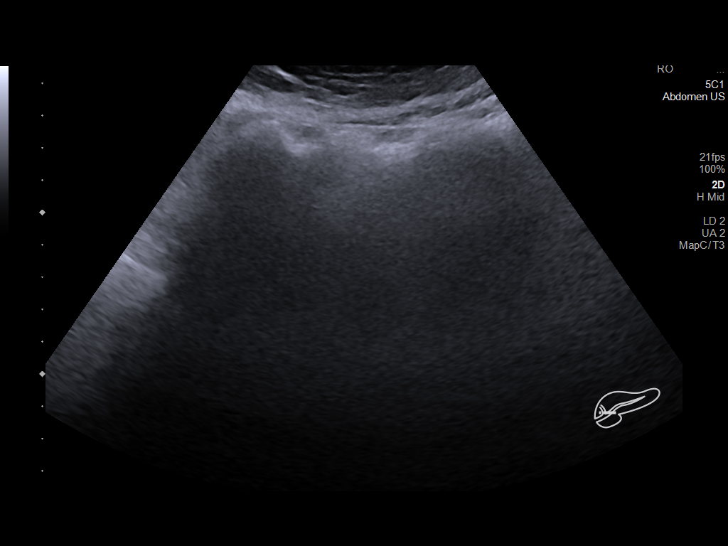
[im 58/93]
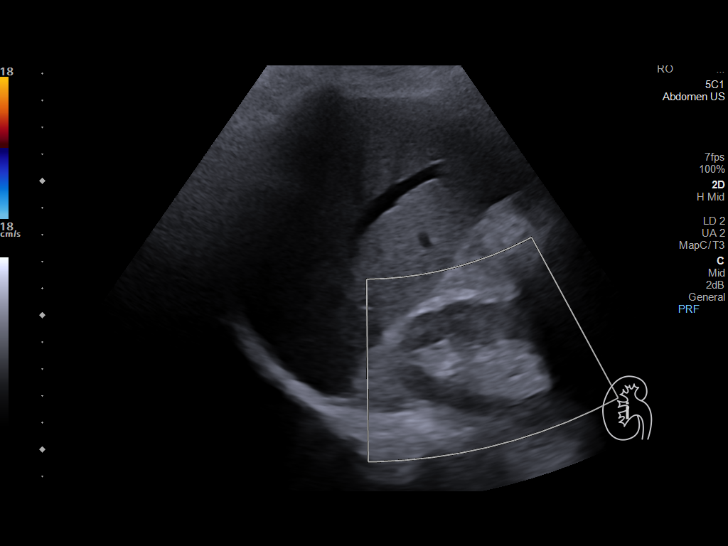
[im 62/93]
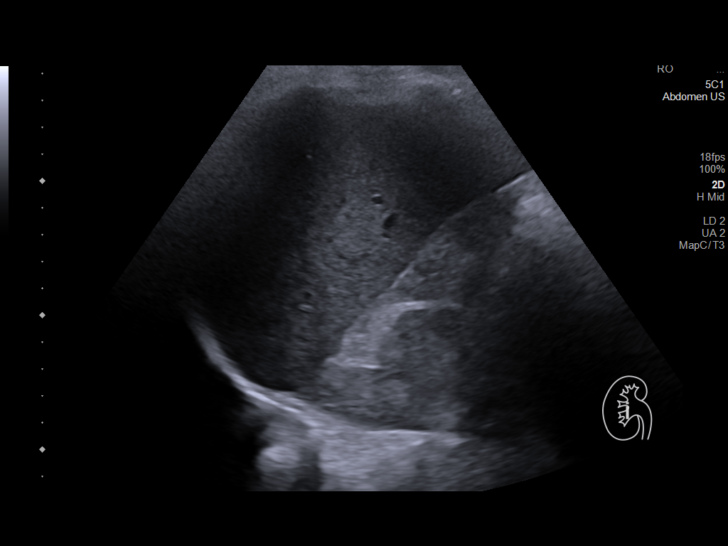
[im 70/93]
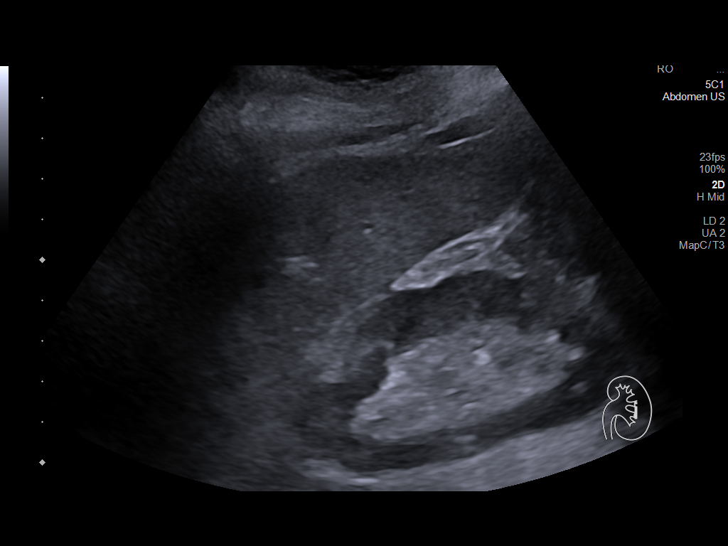
[im 77/93]
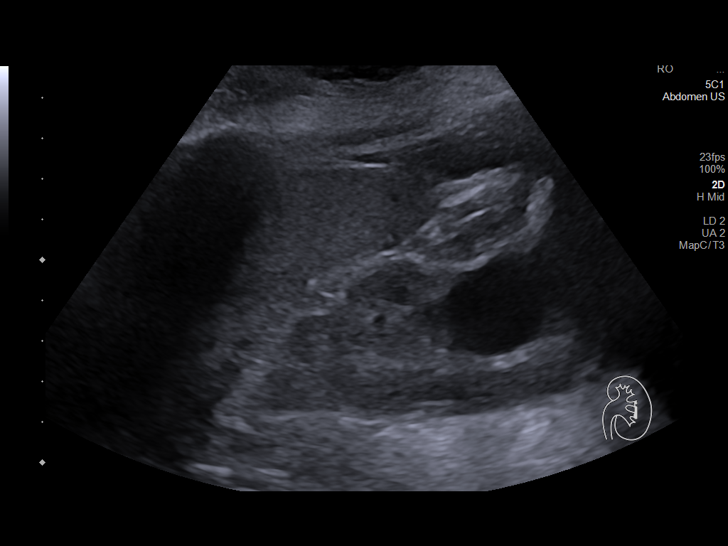
[im 85/93]
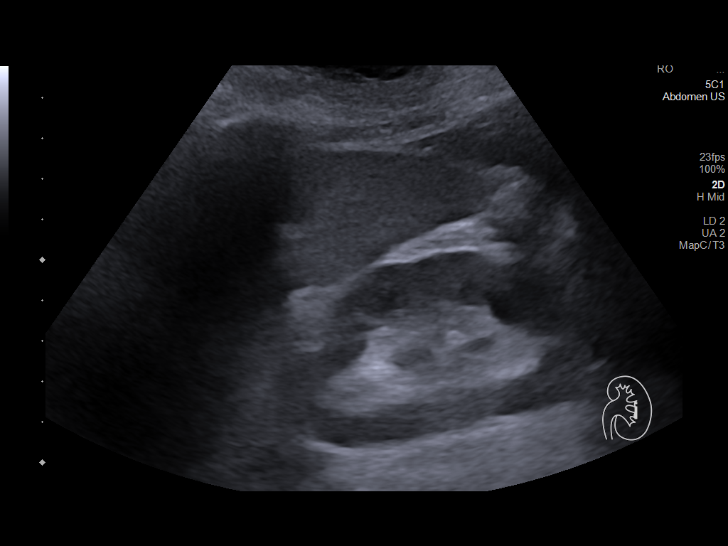
[im 93/93]
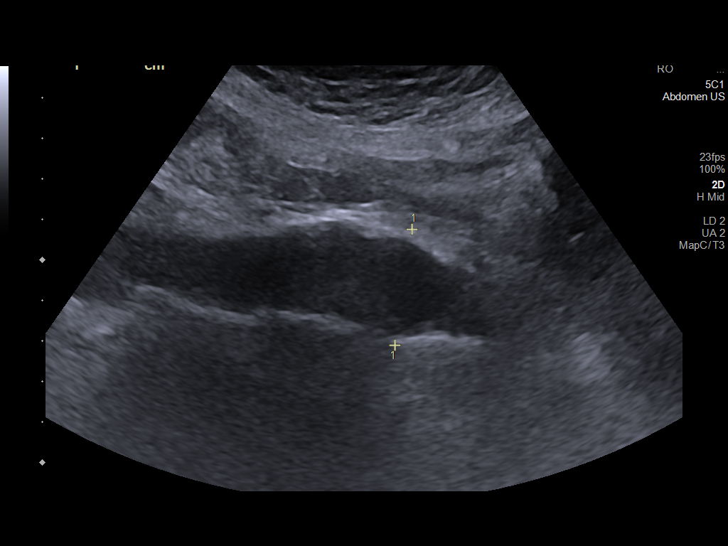

[14 of 25 positions shown; findings below may reference images not displayed]

FINDINGS: Gallbladder: Surgically removed

Common bile duct: Diameter: 6 mm

Liver: Diffusely increased in echogenicity consistent with fatty
infiltration. No focal mass is noted. Portal vein is patent on color
Doppler imaging with normal direction of blood flow towards the
liver.

IVC: No abnormality visualized.

Pancreas: Not well visualized due to overlying bowel gas.

Spleen: Size and appearance within normal limits.

Right Kidney: Length: 8 cm. Echogenicity within normal limits. No
mass or hydronephrosis visualized.

Left Kidney: Length: 8.1 cm. 3.1 cm cyst is noted in the lower pole
of the left kidney. No mass lesion or hydronephrosis is noted.

Abdominal aorta: Atherosclerotic changes are noted without
aneurysmal dilatation.

Other findings: None.
IMPRESSION: Status post cholecystectomy.

Fatty infiltration of the liver.

Left renal cyst.

## 2022-08-20 ENCOUNTER — Encounter: Payer: Self-pay | Admitting: Pulmonary Disease

## 2022-08-20 ENCOUNTER — Ambulatory Visit: Payer: Medicare Other | Admitting: Pulmonary Disease

## 2022-08-20 VITALS — BP 124/74 | HR 56 | Ht 66.0 in | Wt 145.6 lb

## 2022-08-20 DIAGNOSIS — Z01811 Encounter for preprocedural respiratory examination: Secondary | ICD-10-CM | POA: Diagnosis not present

## 2022-08-20 DIAGNOSIS — G4733 Obstructive sleep apnea (adult) (pediatric): Secondary | ICD-10-CM

## 2022-08-20 NOTE — Progress Notes (Signed)
Erica Gallegos, Critical Care, and Sleep Medicine  Chief Complaint  Patient presents with   Follow-up    She is using CPAP but feels like it's blowing too much air "makes blubbering sound".     Past Surgical History:  She  has a past surgical history that includes Cholecystectomy; Bunionectomy; Cataract extraction; Esophagogastroduodenoscopy (egd) with propofol (N/A, 04/05/2021); maloney dilation (N/A, 04/05/2021); Colonoscopy with propofol (N/A, 12/13/2021); Esophagogastroduodenoscopy (egd) with propofol (N/A, 12/13/2021); and maloney dilation (N/A, 12/13/2021).  Past Medical History:  HTN, GERD, HLD, Anxiety, Depression, Migraine headaches, Renal artery stenosis, Spinal stenosis  Constitutional:  BP 124/74 (BP Location: Left Arm, Cuff Size: Normal)   Pulse (!) 56   Ht 5\' 6"  (1.676 m)   Wt 145 lb 9.6 oz (66 kg)   SpO2 97% Comment: on RA  BMI 23.50 kg/m   Brief Summary:  Erica Gallegos is a 67 y.o. female smoker with sleep apnea.      Subjective:   She uses CPAP nightly.  Machined starting to make funny noises and she has to turn machine off to make noise stop.  She would like to try a different mask.  Pressure setting is comfortable and using CPAP helps her sleep.  She naps for about 2 hours in the afternoon.  Didn't realize she needed to use CPAP during naps also.  Physical Exam:   Appearance - well kempt   ENMT - no sinus tenderness, no oral exudate, no LAN, Mallampati 3 airway, no stridor, wears dentures  Respiratory - equal breath sounds bilaterally, no wheezing or rales  CV - s1s2 regular rate and rhythm, no murmurs  Ext - no clubbing, no edema  Skin - no rashes  Psych - normal mood and affect   Sleep Tests:  HST 05/29/17 >> AHI 22, SpO2 low 78%  Social History:  She  reports that she has quit smoking. Her smoking use included cigarettes. She has never used smokeless tobacco. She reports current alcohol use. She reports current drug use. Drug:  Marijuana.  Family History:  Her family history includes Brain cancer in her brother; Cancer in her brother, father, and mother; Colon polyps in her sister; Diabetes in her mother; Heart disease in her father and mother; Hyperlipidemia in her mother; Hypertension in her father, mother, sister, and sister; Lupus in her sister; Non-Hodgkin's lymphoma in her brother; Stroke in her father.     Assessment/Plan:   Obstructive sleep apnea. - she is compliant with CPAP and reports benefit from therapy - she uses [place] for her DME - her current CPAP is more than 65 years old - will arrange for new Resmed auto CPAP 5 to 15 cm H2O - will arrange for refitting of her CPAP mask - her previous sleep study is located in media tap in Epic under 12/14/20 Gallegos test  Back pain. - working with Dr. Jake Samples with orthopedics - she needs to build up her bone density before she can have surgery - no Gallegos restrictions for her to have surgery if needed  Insomnia. - she uses melatonin at night  Resistant hypertension. - followed by Dr. Dina Rich with [facility]  Time Spent Involved in Patient Care on Day of Examination:  26 minutes  Follow up:   There are no Patient Instructions on file for this visit.  Medication List:   Allergies as of 08/20/2022       Reactions   Atenolol Swelling   Amlodipine Swelling  Medication List        Accurate as of August 20, 2022 10:13 AM. If you have any questions, ask your nurse or doctor.          acetaminophen 325 MG tablet Commonly known as: TYLENOL Take 650 mg by mouth every 6 (six) hours as needed.   alendronate 70 MG tablet Commonly known as: FOSAMAX Take 1 tablet (70 mg total) by mouth every 7 (seven) days. Take with a full glass of water on an empty stomach.   ascorbic acid 500 MG tablet Commonly known as: VITAMIN C Take 500 mg by mouth daily.   atorvastatin 40 MG tablet Commonly known as:  LIPITOR TAKE 1 TABLET(40 MG) BY MOUTH DAILY   buPROPion 200 MG 12 hr tablet Commonly known as: WELLBUTRIN SR TAKE 1 TABLET(200 MG) BY MOUTH TWICE DAILY   carvedilol 12.5 MG tablet Commonly known as: COREG TAKE 1 TABLET(12.5 MG) BY MOUTH TWICE DAILY   chlorthalidone 25 MG tablet Commonly known as: HYGROTON TAKE 1 AND 1/2 TABLETS(37.5 MG) BY MOUTH DAILY   clonazePAM 0.5 MG tablet Commonly known as: KLONOPIN TAKE 1 TABLET(0.5 MG) BY MOUTH DAILY AS NEEDED FOR ANXIETY OR SLEEP   cyanocobalamin 500 MCG tablet Commonly known as: VITAMIN B12 Take 500 mcg by mouth daily.   cyclobenzaprine 10 MG tablet Commonly known as: FLEXERIL TAKE 1 TABLET BY MOUTH EVERY NIGHT AT BEDTIME AS NEEDED FOR BACK OR NERVE PAIN   diclofenac 50 MG EC tablet Commonly known as: VOLTAREN Take 50 mg by mouth 2 (two) times daily as needed for mild pain or moderate pain.   DULoxetine 60 MG capsule Commonly known as: CYMBALTA Take 60 mg by mouth 2 (two) times daily.   esomeprazole 20 MG capsule Commonly known as: NexIUM Take 1 capsule (20 mg total) by mouth daily at 12 noon.   ezetimibe 10 MG tablet Commonly known as: ZETIA TAKE 1 TABLET(10 MG) BY MOUTH DAILY   Fish Oil 1000 MG Caps Take 4,000 mg by mouth daily.   gabapentin 300 MG capsule Commonly known as: NEURONTIN TAKE 1 CAPSULE BY MOUTH EVERY MORNING AND EVERY EVENING THEN TAKE 2 CAPSULES BY MOUTH AT BEDTIME   hydrALAZINE 100 MG tablet Commonly known as: APRESOLINE TAKE 1 TABLET(100 MG) BY MOUTH TWICE DAILY   lamoTRIgine 25 MG tablet Commonly known as: LAMICTAL Take 2 tablets (50 mg total) by mouth daily. What changed:  how much to take when to take this additional instructions   lisinopril 40 MG tablet Commonly known as: ZESTRIL TAKE 1 TABLET(40 MG) BY MOUTH DAILY   Melatonin 10 MG Tabs Take 10 mg by mouth at bedtime.   spironolactone 25 MG tablet Commonly known as: ALDACTONE TAKE 1 TABLET(25 MG) BY MOUTH DAILY   Vitamin D3 50  MCG (2000 UT) Tabs Take 2,000 Units by mouth daily.        Signature:  Coralyn Helling, MD [facility] Gallegos/Critical Care Pager - [phone] 08/20/2022, 10:13 AM

## 2022-08-20 NOTE — Patient Instructions (Signed)
Will arrange for new CPAP machine and refitting of your CPAP mask  Follow up in 4 months

## 2022-08-22 ENCOUNTER — Other Ambulatory Visit: Payer: Self-pay

## 2022-08-22 DIAGNOSIS — Z79899 Other long term (current) drug therapy: Secondary | ICD-10-CM

## 2022-08-30 ENCOUNTER — Ambulatory Visit (INDEPENDENT_AMBULATORY_CARE_PROVIDER_SITE_OTHER): Payer: Medicare Other | Admitting: Internal Medicine

## 2022-08-30 ENCOUNTER — Encounter: Payer: Self-pay | Admitting: Internal Medicine

## 2022-08-30 VITALS — BP 136/84 | HR 57 | Ht 66.0 in | Wt 145.6 lb

## 2022-08-30 DIAGNOSIS — Z0000 Encounter for general adult medical examination without abnormal findings: Secondary | ICD-10-CM

## 2022-08-30 NOTE — Patient Instructions (Signed)
It was a pleasure to see you today.  Thank you for giving [place] the opportunity to be involved in your care.  Below is a brief recap of your visit and next steps.  We will plan to see you again in September.  Summary Welcome to Medicare visit completed We will plan for follow up as scheduled on 9/20

## 2022-08-30 NOTE — Progress Notes (Signed)
Subjective:    Erica Gallegos is a 67 y.o. female who presents for a Welcome to Medicare exam.   Review of Systems: Negative  Cardiac Risk Factors include: advanced age (>4men, >41 women);hypertension      Objective:    Today's Vitals   08/30/22 1056 08/30/22 1058  BP: 136/84   Pulse: (!) 57   SpO2: 95%   Weight: 145 lb 9.6 oz (66 kg)   Height: 5\' 6"  (1.676 m)   PainSc:  1   Body mass index is 23.5 kg/m.  Medications Outpatient Encounter Medications as of 08/30/2022  Medication Sig   acetaminophen (TYLENOL) 325 MG tablet Take 650 mg by mouth every 6 (six) hours as needed.   alendronate (FOSAMAX) 70 MG tablet Take 1 tablet (70 mg total) by mouth every 7 (seven) days. Take with a full glass of water on an empty stomach.   atorvastatin (LIPITOR) 40 MG tablet TAKE 1 TABLET(40 MG) BY MOUTH DAILY   buPROPion (WELLBUTRIN SR) 200 MG 12 hr tablet TAKE 1 TABLET(200 MG) BY MOUTH TWICE DAILY   carvedilol (COREG) 12.5 MG tablet TAKE 1 TABLET(12.5 MG) BY MOUTH TWICE DAILY   chlorthalidone (HYGROTON) 25 MG tablet TAKE 1 AND 1/2 TABLETS(37.5 MG) BY MOUTH DAILY   Cholecalciferol (VITAMIN D3) 50 MCG (2000 UT) TABS Take 2,000 Units by mouth daily.   clonazePAM (KLONOPIN) 0.5 MG tablet TAKE 1 TABLET(0.5 MG) BY MOUTH DAILY AS NEEDED FOR ANXIETY OR SLEEP   cyclobenzaprine (FLEXERIL) 10 MG tablet TAKE 1 TABLET BY MOUTH EVERY NIGHT AT BEDTIME AS NEEDED FOR BACK OR NERVE PAIN   diclofenac (VOLTAREN) 50 MG EC tablet Take 50 mg by mouth 2 (two) times daily as needed for mild pain or moderate pain.   DULoxetine (CYMBALTA) 60 MG capsule Take 60 mg by mouth 2 (two) times daily.   ezetimibe (ZETIA) 10 MG tablet TAKE 1 TABLET(10 MG) BY MOUTH DAILY   gabapentin (NEURONTIN) 300 MG capsule TAKE 1 CAPSULE BY MOUTH EVERY MORNING AND EVERY EVENING THEN TAKE 2 CAPSULES BY MOUTH AT BEDTIME   hydrALAZINE (APRESOLINE) 100 MG tablet TAKE 1 TABLET(100 MG) BY MOUTH TWICE DAILY   lamoTRIgine (LAMICTAL) 25 MG tablet  Take 2 tablets (50 mg total) by mouth daily. (Patient taking differently: Take 25-50 mg by mouth See admin instructions. Take 25 mg in the morning and 50 mg at bedtime)   lisinopril (ZESTRIL) 40 MG tablet TAKE 1 TABLET(40 MG) BY MOUTH DAILY   Melatonin 10 MG TABS Take 10 mg by mouth at bedtime.   Omega-3 Fatty Acids (FISH OIL) 1000 MG CAPS Take 4,000 mg by mouth daily.   spironolactone (ALDACTONE) 25 MG tablet TAKE 1 TABLET(25 MG) BY MOUTH DAILY   vitamin B-12 (CYANOCOBALAMIN) 500 MCG tablet Take 500 mcg by mouth daily.   vitamin C (ASCORBIC ACID) 500 MG tablet Take 500 mg by mouth daily.   esomeprazole (NEXIUM) 20 MG capsule Take 1 capsule (20 mg total) by mouth daily at 12 noon.   No facility-administered encounter medications on file as of 08/30/2022.     History: Past Medical History:  Diagnosis Date   Anxiety    Arthritis    Complication of anesthesia    Patient woke up during anesthesia with bunionectomy   GERD (gastroesophageal reflux disease)    HLD (hyperlipidemia)    HTN (hypertension)    Major depressive disorder    Migraine with aura    PONV (postoperative nausea and vomiting)    RAS (  renal artery stenosis) (HCC)    Sleep apnea    Sleep related hypoxia    Spinal stenosis    Past Surgical History:  Procedure Laterality Date   BUNIONECTOMY     CATARACT EXTRACTION     CHOLECYSTECTOMY     COLONOSCOPY WITH PROPOFOL N/A 12/13/2021   Procedure: COLONOSCOPY WITH PROPOFOL;  Surgeon: Corbin Ade, MD;  Location: [facility];  Service: Endoscopy;  Laterality: N/A;  8:00am, asa 2   ESOPHAGOGASTRODUODENOSCOPY (EGD) WITH PROPOFOL N/A 04/05/2021   Procedure: ESOPHAGOGASTRODUODENOSCOPY (EGD) WITH PROPOFOL;  Surgeon: Corbin Ade, MD;  Location: [facility];  Service: Endoscopy;  Laterality: N/A;  10:15am   ESOPHAGOGASTRODUODENOSCOPY (EGD) WITH PROPOFOL N/A 12/13/2021   Procedure: ESOPHAGOGASTRODUODENOSCOPY (EGD) WITH PROPOFOL;  Surgeon: Corbin Ade, MD;  Location: [facility];  Service: Endoscopy;  Laterality: N/A;   MALONEY DILATION N/A 04/05/2021   Procedure: Elease Hashimoto DILATION;  Surgeon: Corbin Ade, MD;  Location: [facility];  Service: Endoscopy;  Laterality: N/A;   MALONEY DILATION N/A 12/13/2021   Procedure: Elease Hashimoto DILATION;  Surgeon: Corbin Ade, MD;  Location: [facility];  Service: Endoscopy;  Laterality: N/A;    Family History  Problem Relation Age of Onset   Cancer Mother    Hypertension Mother    Diabetes Mother    Heart disease Mother    Hyperlipidemia Mother    Cancer Father    Stroke Father    Hypertension Father    Heart disease Father    Hypertension Sister    Lupus Sister    Hypertension Sister    Colon polyps Sister    Cancer Brother    Brain cancer Brother    Non-Hodgkin's lymphoma Brother    Colon cancer Neg Hx    Social History   Occupational History   Not on file  Tobacco Use   Smoking status: Former    Types: Cigarettes   Smokeless tobacco: Never   Tobacco comments:    She quit smoking in the early 90's  Vaping Use   Vaping status: Every Day  Substance and Sexual Activity   Alcohol use: Yes    Comment: seldom   Drug use: Yes    Types: Marijuana    Comment: CBD cream; Marijuana a couple times a week. helps with her anxiety and migraines.   Sexual activity: Yes    Birth control/protection: None, Post-menopausal    Tobacco Counseling Counseling given: Not Answered Tobacco comments: She quit smoking in the early 90's   Immunizations and Health Maintenance Immunization History  Administered Date(s) Administered   Influenza,inj,Quad PF,6+ Mos 10/13/2020, 10/22/2021   Moderna Covid-19 Vaccine Bivalent Booster 71yrs & up 07/07/2020, 10/16/2020   Moderna SARS-COV2 Booster Vaccination 07/28/2019   Moderna Sars-Covid-2 Vaccination 11/23/2019   PFIZER(Purple Top)SARS-COV-2 Vaccination 03/12/2019, 04/02/2019   PNEUMOCOCCAL CONJUGATE-20 06/18/2022   Tdap 07/24/2021   Zoster Recombinant(Shingrix)  07/24/2021, 10/22/2021   Health Maintenance Due  Topic Date Due   COVID-19 Vaccine (6 - 2023-24 season) 09/07/2021    Activities of Daily Living    08/30/2022   11:01 AM  In your present state of health, do you have any difficulty performing the following activities:  Hearing? 0  Vision? 0  Difficulty concentrating or making decisions? 0  Walking or climbing stairs? 1  Dressing or bathing? 0  Doing errands, shopping? 0  Preparing Food and eating ? N  Using the Toilet? N  In the past six months, have you accidently leaked  urine? Y  Do you have problems with loss of bowel control? N  Managing your Medications? N  Managing your Finances? N  Housekeeping or managing your Housekeeping? N   Advanced Directives: Does Patient Have a Medical Advance Directive?: Yes Type of Advance Directive: Healthcare Power of Attorney, Living will Does patient want to make changes to medical advance directive?: No - Patient declined Copy of Healthcare Power of Attorney in Chart?: No - copy requested    Assessment:    This is a routine wellness examination for this patient .   Vision/Hearing screen No results found.  Dietary issues and exercise activities discussed:      Goals      Patient Stated     Be able to walk straight and have nerves released. Also wants to travel more and be able to drive again.      Depression Screen    08/30/2022   11:09 AM 08/30/2022   11:07 AM 06/18/2022   11:05 AM 02/26/2022   10:26 AM  PHQ 2/9 Scores  PHQ - 2 Score 0 0 2 2  PHQ- 9 Score   8 11     Fall Risk    08/30/2022   11:08 AM  Fall Risk   Falls in the past year? 0  Number falls in past yr: 0  Injury with Fall? 0  Risk for fall due to : No Fall Risks  Follow up Falls evaluation completed    Cognitive Function:    08/21/2021    7:11 PM  MMSE - Mini Mental State Exam  Orientation to time 5  Orientation to Place 5  Registration 3  Attention/ Calculation 5  Recall 2  Language- name 2  objects 2  Language- repeat 1  Language- follow 3 step command 3  Language- read & follow direction 1  Write a sentence 1  Copy design 1  Total score 29        08/30/2022   11:09 AM  6CIT Screen  What Year? 0 points  What month? 0 points  What time? 0 points  Count back from 20 0 points  Months in reverse 0 points  Repeat phrase 0 points  Total Score 0 points    Patient Care Team: Billie Lade, MD as PCP - General (Internal Medicine) Wyline Mood, Dorothe Pea, MD as PCP - Cardiology (Cardiology) Jena Gauss, [name], MD as Consulting Physician (Gastroenterology)     Plan:     I have personally reviewed and noted the following in the patient's chart:   Medical and social history Use of alcohol, tobacco or illicit drugs  Current medications and supplements Functional ability and status Nutritional status Physical activity Advanced directives List of other physicians Hospitalizations, surgeries, and ER visits in previous 12 months Vitals Screenings to include cognitive, depression, and falls Referrals and appointments  In addition, I have reviewed and discussed with patient certain preventive protocols, quality metrics, and best practice recommendations. A written personalized care plan for preventive services as well as general preventive health recommendations were provided to patient.

## 2022-09-04 ENCOUNTER — Other Ambulatory Visit: Payer: Self-pay | Admitting: Internal Medicine

## 2022-09-04 DIAGNOSIS — F32A Depression, unspecified: Secondary | ICD-10-CM

## 2022-09-04 DIAGNOSIS — K219 Gastro-esophageal reflux disease without esophagitis: Secondary | ICD-10-CM

## 2022-09-06 ENCOUNTER — Telehealth: Payer: Self-pay | Admitting: Internal Medicine

## 2022-09-06 MED ORDER — DULOXETINE HCL 60 MG PO CPEP: 60.0000 mg | ORAL_CAPSULE | Freq: Two times a day (BID) | ORAL | 1 refills | Status: DC

## 2022-09-06 NOTE — Telephone Encounter (Signed)
Patient called need refill today, will be out before Monday.  DULoxetine (CYMBALTA) 60 MG capsule   Pharmacy: [facility] [place]

## 2022-09-10 ENCOUNTER — Telehealth (HOSPITAL_COMMUNITY): Payer: Self-pay | Admitting: Psychiatry

## 2022-09-10 ENCOUNTER — Other Ambulatory Visit: Payer: Self-pay | Admitting: Internal Medicine

## 2022-09-18 ENCOUNTER — Ambulatory Visit (HOSPITAL_COMMUNITY)
Admission: RE | Admit: 2022-09-18 | Discharge: 2022-09-18 | Disposition: A | Discharge status: Home / Self Care | Payer: Medicare Other | Source: Ambulatory Visit | Attending: Internal Medicine | Admitting: Internal Medicine

## 2022-09-18 DIAGNOSIS — Z1231 Encounter for screening mammogram for malignant neoplasm of breast: Secondary | ICD-10-CM | POA: Diagnosis not present

## 2022-09-19 DIAGNOSIS — G4733 Obstructive sleep apnea (adult) (pediatric): Secondary | ICD-10-CM | POA: Diagnosis not present

## 2022-09-23 DIAGNOSIS — M4316 Spondylolisthesis, lumbar region: Secondary | ICD-10-CM | POA: Diagnosis not present

## 2022-09-23 DIAGNOSIS — M48062 Spinal stenosis, lumbar region with neurogenic claudication: Secondary | ICD-10-CM | POA: Diagnosis not present

## 2022-09-23 DIAGNOSIS — M810 Age-related osteoporosis without current pathological fracture: Secondary | ICD-10-CM | POA: Diagnosis not present

## 2022-09-27 ENCOUNTER — Ambulatory Visit: Payer: Medicare Other | Admitting: Internal Medicine

## 2022-09-27 ENCOUNTER — Encounter: Payer: Self-pay | Admitting: Internal Medicine

## 2022-09-27 VITALS — BP 125/75 | HR 63 | Ht 66.0 in | Wt 146.0 lb

## 2022-09-27 DIAGNOSIS — M4800 Spinal stenosis, site unspecified: Secondary | ICD-10-CM | POA: Diagnosis not present

## 2022-09-27 DIAGNOSIS — M810 Age-related osteoporosis without current pathological fracture: Secondary | ICD-10-CM | POA: Diagnosis not present

## 2022-09-27 DIAGNOSIS — F419 Anxiety disorder, unspecified: Secondary | ICD-10-CM | POA: Diagnosis not present

## 2022-09-27 DIAGNOSIS — F32A Depression, unspecified: Secondary | ICD-10-CM

## 2022-09-27 DIAGNOSIS — Z23 Encounter for immunization: Secondary | ICD-10-CM | POA: Diagnosis not present

## 2022-09-27 NOTE — Patient Instructions (Signed)
It was a pleasure to see you today.  Thank you for giving [place] the opportunity to be involved in your care.  Below is a brief recap of your visit and next steps.  We will plan to see you again in 3 months.  Summary Please call Dr. Isidoro Donning office when you leave today to make sure he is aware of Dr. Mattie Marlin recommendations Psychiatry next month Flu shot today Follow up in 3 months

## 2022-09-30 ENCOUNTER — Telehealth: Payer: Self-pay

## 2022-09-30 NOTE — Telephone Encounter (Signed)
Pt called stating she had her bone density study on August 9 which showed worsening results. Pt has been taking Fosamax 70mg  weekly. States she had a appt with Dr.Dolly which recommended she contact our office. Please advise.

## 2022-10-01 NOTE — Telephone Encounter (Signed)
Spoke with pt. She requested you contact Dr.Dawley with [facility]. States he had talked with her about an injection for her bone density. Contact [phone]

## 2022-10-03 ENCOUNTER — Other Ambulatory Visit: Payer: Self-pay | Admitting: Internal Medicine

## 2022-10-03 DIAGNOSIS — F419 Anxiety disorder, unspecified: Secondary | ICD-10-CM

## 2022-10-03 MED ORDER — CLONAZEPAM 0.5 MG PO TABS
0.5000 mg | ORAL_TABLET | Freq: Every evening | ORAL | 0 refills | Status: DC | PRN
Start: 2022-10-03 — End: 2022-11-01

## 2022-10-03 MED ORDER — CYCLOBENZAPRINE HCL 10 MG PO TABS: ORAL_TABLET | ORAL | 0 refills | Status: DC

## 2022-10-03 NOTE — Telephone Encounter (Signed)
Spoke with pt advising her the lastest DEXA scan shows osteoporosis and if she would like to try injectable treatments she will need an appointment to discuss with Dr.Nida and in the mean time continue taking Fosamax 70mg  weekly per Dr.Nida. Understanding voiced per pt.

## 2022-10-04 ENCOUNTER — Encounter: Payer: Self-pay | Admitting: Internal Medicine

## 2022-10-04 NOTE — Assessment & Plan Note (Signed)
Followed by neurosurgery (Dr. Jake Samples).  As noted above, surgical intervention has been delayed in the setting of osteoporosis.  Patient reports that she will return to care for a steroid injection in the near future.

## 2022-10-04 NOTE — Assessment & Plan Note (Signed)
She will establish care with a new psychiatrist next month.  She is currently prescribed Wellbutrin, Klonopin, Lamictal and Cymbalta.  Mood remains stable.  She is tearful during today's encounter, which is largely due to poorly controlled pain.  No medication changes have been made today.

## 2022-10-04 NOTE — Assessment & Plan Note (Signed)
T-score -2.7 at neck of left femur on DEXA from last month, consistent with osteoporosis.  She is currently prescribed Fosamax and followed by endocrinology. Ms. Erica Gallegos reports today that surgical intervention for lumbar spinal stenosis has been delayed in light of this result.  Her neurosurgeon is recommending that she undergo injection treatment for osteoporosis.  I have recommended that she contact Dr. Fransico Him to discuss this.

## 2022-10-04 NOTE — Assessment & Plan Note (Signed)
Influenza vaccine administered today.

## 2022-10-09 ENCOUNTER — Telehealth: Payer: Self-pay

## 2022-10-09 NOTE — Telephone Encounter (Signed)
Pt has an upcoming appointment. Which labs do you want drawn for pt?

## 2022-10-09 NOTE — Telephone Encounter (Signed)
Pt made aware

## 2022-10-18 ENCOUNTER — Encounter: Payer: Self-pay | Admitting: "Endocrinology

## 2022-10-18 ENCOUNTER — Ambulatory Visit: Payer: Medicare Other | Admitting: "Endocrinology

## 2022-10-18 VITALS — BP 134/72 | HR 56 | Ht 66.0 in | Wt 150.2 lb

## 2022-10-18 DIAGNOSIS — M816 Localized osteoporosis [Lequesne]: Secondary | ICD-10-CM | POA: Diagnosis not present

## 2022-10-18 NOTE — Progress Notes (Signed)
10/18/2022, 1:54 PM  Endocrinology follow-up note   Subjective:    Patient ID: Erica Gallegos, female    DOB: July 24, 1955, PCP Billie Lade, MD   Past Medical History:  Diagnosis Date   Anxiety    Arthritis    Complication of anesthesia    Patient woke up during anesthesia with bunionectomy   GERD (gastroesophageal reflux disease)    HLD (hyperlipidemia)    HTN (hypertension)    Major depressive disorder    Migraine with aura    PONV (postoperative nausea and vomiting)    RAS (renal artery stenosis) (HCC)    Sleep apnea    Sleep related hypoxia    Spinal stenosis    Past Surgical History:  Procedure Laterality Date   BUNIONECTOMY     CATARACT EXTRACTION     CHOLECYSTECTOMY     COLONOSCOPY WITH PROPOFOL N/A 12/13/2021   Procedure: COLONOSCOPY WITH PROPOFOL;  Surgeon: Corbin Ade, MD;  Location: [facility];  Service: Endoscopy;  Laterality: N/A;  8:00am, asa 2   ESOPHAGOGASTRODUODENOSCOPY (EGD) WITH PROPOFOL N/A 04/05/2021   Procedure: ESOPHAGOGASTRODUODENOSCOPY (EGD) WITH PROPOFOL;  Surgeon: Corbin Ade, MD;  [facility]: [facility];  Service: Endoscopy;  Laterality: N/A;  10:15am   ESOPHAGOGASTRODUODENOSCOPY (EGD) WITH PROPOFOL N/A 12/13/2021   Procedure: ESOPHAGOGASTRODUODENOSCOPY (EGD) WITH PROPOFOL;  Surgeon: Corbin Ade, MD;  [facility];  Service: Endoscopy;  Laterality: N/A;   MALONEY DILATION N/A 04/05/2021   Procedure: Elease Hashimoto DILATION;  Surgeon: Corbin Ade, MD;  [facility]: [facility];  Service: Endoscopy;  Laterality: N/A;   MALONEY DILATION N/A 12/13/2021   Procedure: Elease Hashimoto DILATION;  Surgeon: Corbin Ade, MD;  [facility]: [facility];  Service: Endoscopy;  Laterality: N/A;   Social History   Socioeconomic History   Marital status: Married    Spouse name: Not on file   Number of children: 3   Years of education: Not on file   Highest education level: Not on file   Occupational History   Not on file  Tobacco Use   Smoking status: Former    Types: Cigarettes   Smokeless tobacco: Never   Tobacco comments:    She quit smoking in the early 90's  Vaping Use   Vaping status: Every Day  Substance and Sexual Activity   Alcohol use: Yes    Comment: seldom   Drug use: Yes    Types: Marijuana    Comment: CBD cream; Marijuana a couple times a week. helps with her anxiety and migraines.   Sexual activity: Yes    Birth control/protection: None, Post-menopausal  Other Topics Concern   Not on file  Social History Narrative   Lives with her husband, retired.    Social Determinants of Health   Financial Resource Strain: Low Risk  (08/30/2022)   Overall Financial Resource Strain (CARDIA)    Difficulty of Paying Living Expenses: Not hard at all  Food Insecurity: No Food Insecurity (08/30/2022)   Hunger Vital Sign    Worried About Running Out of Food in the Last Year: Never true    Ran Out of Food in the Last Year: Never true  Transportation Needs: No Transportation Needs (08/30/2022)  PRAPARE - Administrator, Civil Service (Medical): No    Lack of Transportation (Non-Medical): No  Physical Activity: Sufficiently Active (08/30/2022)   Exercise Vital Sign    Days of Exercise per Week: 7 days    Minutes of Exercise per Session: 60 min  Stress: Stress Concern Present (08/30/2022)   [employer] of Occupational Health - Occupational Stress Questionnaire    Feeling of Stress : To some extent  Social Connections: Moderately Isolated (08/30/2022)   Social Connection and Isolation Panel [NHANES]    Frequency of Communication with Friends and Family: More than three times a week    Frequency of Social Gatherings with Friends and Family: Three times a week    Attends Religious Services: Never    Active Member of Clubs or Organizations: No    Attends Banker Meetings: Never    Marital Status: Married   Family History  Problem  Relation Age of Onset   Cancer Mother    Hypertension Mother    Diabetes Mother    Heart disease Mother    Hyperlipidemia Mother    Cancer Father    Stroke Father    Hypertension Father    Heart disease Father    Hypertension Sister    Lupus Sister    Hypertension Sister    Colon polyps Sister    Cancer Brother    Brain cancer Brother    Non-Hodgkin's lymphoma Brother    Colon cancer Neg Hx    Outpatient Encounter Medications as of 10/18/2022  Medication Sig   acetaminophen (TYLENOL) 325 MG tablet Take 650 mg by mouth every 6 (six) hours as needed.   alendronate (FOSAMAX) 70 MG tablet Take 1 tablet (70 mg total) by mouth every 7 (seven) days. Take with a full glass of water on an empty stomach.   atorvastatin (LIPITOR) 40 MG tablet TAKE 1 TABLET(40 MG) BY MOUTH DAILY   buPROPion (WELLBUTRIN SR) 200 MG 12 hr tablet TAKE 1 TABLET(200 MG) BY MOUTH TWICE DAILY   carvedilol (COREG) 12.5 MG tablet TAKE 1 TABLET(12.5 MG) BY MOUTH TWICE DAILY   chlorthalidone (HYGROTON) 25 MG tablet TAKE 1 AND 1/2 TABLETS(37.5 MG) BY MOUTH DAILY   Cholecalciferol (VITAMIN D3) 50 MCG (2000 UT) TABS Take 2,000 Units by mouth daily.   clonazePAM (KLONOPIN) 0.5 MG tablet Take 1 tablet (0.5 mg total) by mouth at bedtime as needed for anxiety (sleep).   cyclobenzaprine (FLEXERIL) 10 MG tablet TAKE 1 TABLET BY MOUTH EVERY NIGHT AT BEDTIME AS NEEDED FOR BACK OR NERVE PAIN   diclofenac (VOLTAREN) 50 MG EC tablet Take 50 mg by mouth 2 (two) times daily as needed for mild pain or moderate pain.   DULoxetine (CYMBALTA) 60 MG capsule Take 1 capsule (60 mg total) by mouth 2 (two) times daily.   esomeprazole (NEXIUM) 20 MG capsule TAKE 1 CAPSULE BY MOUTH EVERY DAY AT NOON   ezetimibe (ZETIA) 10 MG tablet TAKE 1 TABLET(10 MG) BY MOUTH DAILY   gabapentin (NEURONTIN) 300 MG capsule TAKE 1 CAPSULE BY MOUTH EVERY MORNING AND EVERY EVENING THEN TAKE 2 CAPSULES BY MOUTH AT BEDTIME   hydrALAZINE (APRESOLINE) 100 MG tablet TAKE 1  TABLET(100 MG) BY MOUTH TWICE DAILY   lamoTRIgine (LAMICTAL) 25 MG tablet Take 2 tablets (50 mg total) by mouth daily. (Patient taking differently: Take 25-50 mg by mouth See admin instructions. Take 25 mg in the morning and 50 mg at bedtime)   lisinopril (ZESTRIL) 40 MG tablet  TAKE 1 TABLET(40 MG) BY MOUTH DAILY   Melatonin 10 MG TABS Take 10 mg by mouth at bedtime.   Omega-3 Fatty Acids (FISH OIL) 1000 MG CAPS Take 4,000 mg by mouth daily.   oxyCODONE-acetaminophen (PERCOCET/ROXICET) 5-325 MG tablet Take 1 tablet by mouth every 6 (six) hours as needed.   spironolactone (ALDACTONE) 25 MG tablet TAKE 1 TABLET(25 MG) BY MOUTH DAILY   vitamin B-12 (CYANOCOBALAMIN) 500 MCG tablet Take 500 mcg by mouth daily.   vitamin C (ASCORBIC ACID) 500 MG tablet Take 500 mg by mouth daily.   No facility-administered encounter medications on file as of 10/18/2022.   ALLERGIES: Allergies  Allergen Reactions   Atenolol Swelling   Amlodipine Swelling    VACCINATION STATUS: Immunization History  Administered Date(s) Administered   Fluad Trivalent(High Dose 65+) 09/27/2022   Influenza,inj,Quad PF,6+ Mos 10/13/2020, 10/22/2021   Moderna Covid-19 Vaccine Bivalent Booster 27yrs & up 07/07/2020, 10/16/2020   Moderna SARS-COV2 Booster Vaccination 07/28/2019   Moderna Sars-Covid-2 Vaccination 11/23/2019   PFIZER(Purple Top)SARS-COV-2 Vaccination 03/12/2019, 04/02/2019   PNEUMOCOCCAL CONJUGATE-20 06/18/2022   Tdap 07/24/2021   Zoster Recombinant(Shingrix) 07/24/2021, 10/22/2021    HPI KASH MOTHERSHEAD is 67 y.o. female who presents today with a medical history as above. she is being seen in follow-up after she was seen in consultation for osteopenia requested by Billie Lade, MD.  She was seen in June 2020 for with a bone density performed in October 2023 which showed a T-score of -2.3 on her hip, -1.7 on femur, and -1.2 on forearms.  Her spine was not included in the study due to degenerative joint  disease. Due to estimated higher fracture risk, she was offered treatment with Fosamax 70 mg p.o. weekly which she continued to tolerate. -In the interval, she obtained a repeat bone density in August 2024 which showed slight worsening of her bone density falling into the category of osteoporosis. -Patient is interested to explore her next options of treatment.  She is accompanied by her husband to clinic. Patient thinks she has lost some height, does have medical history of scoliosis.  She is on vitamin D supplements.   Her most recent labs show mild PTH independent hypercalcemia.  She denies any history of fragility fractures.  Her medical problems include hypertension and hyperlipidemia on treatment. Her intake of dairy products is out of average.  She endorses diffuse body aches and neuropathy/radiculopathies.  She is more than 15 years postmenopausal, a former smoker.   Review of Systems  Constitutional: + Minimally fluctuating body weight, + fatigue, no subjective hyperthermia, no subjective hypothermia Eyes: no blurry vision, no xerophthalmia   Objective:       10/18/2022   10:36 AM 10/18/2022   10:05 AM 09/27/2022    9:50 AM  Vitals with BMI  Height  5\' 6"  5\' 6"   Weight  150 lbs 3 oz 146 lbs  BMI  24.25 23.58  Systolic 134 142 161  Diastolic 72 78 75  Pulse  56 63    BP 134/72 Comment: R arm with manuel cuff  Pulse (!) 56   Ht 5\' 6"  (1.676 m)   Wt 150 lb 3.2 oz (68.1 kg)   BMI 24.24 kg/m   Wt Readings from Last 3 Encounters:  10/18/22 150 lb 3.2 oz (68.1 kg)  09/27/22 146 lb (66.2 kg)  08/30/22 145 lb 9.6 oz (66 kg)    Physical Exam  Constitutional:  Body mass index is 24.24 kg/m.,  not in acute  distress, normal state of mind Eyes: PERRLA, EOMI, no exophthalmos ENT: moist mucous membranes, no gross thyromegaly, no gross cervical lymphadenopathy  Musculoskeletal: + Mild scoliotic deformity of the spine which was not included in the bone density , strength  intact in all four extremities   CMP ( most recent) CMP     Component Value Date/Time   NA 139 08/16/2022 1339   K 5.7 (H) 08/16/2022 1339   CL 106 08/16/2022 1339   CO2 22 08/16/2022 1339   GLUCOSE 70 08/16/2022 1339   GLUCOSE 82 12/04/2021 1106   BUN 27 08/16/2022 1339   CREATININE 1.27 (H) 08/16/2022 1339   CREATININE 1.28 (H) 08/10/2021 1209   CALCIUM 10.5 (H) 08/16/2022 1339   PROT 6.8 08/16/2022 1339   ALBUMIN 4.3 08/16/2022 1339   AST 30 08/16/2022 1339   ALT 88 (H) 08/16/2022 1339   ALKPHOS 124 (H) 08/16/2022 1339   BILITOT 0.5 08/16/2022 1339   GFRNONAA 36 (L) 12/04/2021 1106    Lipid Panel     Component Value Date/Time   CHOL 125 02/26/2022 1110   TRIG 153 (H) 02/26/2022 1110   HDL 46 02/26/2022 1110   CHOLHDL 2.7 02/26/2022 1110   CHOLHDL 2.2 08/10/2021 1209   LDLCALC 53 02/26/2022 1110   LDLCALC 56 08/10/2021 1209   LABVLDL 26 02/26/2022 1110      Lab Results  Component Value Date   TSH 0.972 06/20/2022   TSH 1.720 02/26/2022   TSH 1.260 10/22/2021   TSH 1.45 09/21/2020   TSH 1.93 08/28/2020   FREET4 0.95 06/20/2022   FREET4 1.00 02/26/2022   FREET4 0.74 (L) 10/22/2021    Bone density latest on August 16, 2022  DualFemur Neck Left 08/16/2022 65.1 Osteoporosis -2.7 0.668 g/cm2 -6.8% Yes DualFemur Neck Left 07/02/2021 64.0 Osteopenia -2.3 0.717 g/cm2 - -   DualFemur Total Mean 08/16/2022 65.1 Osteopenia -1.9 0.764 g/cm2 -3.9% Yes DualFemur Total Mean 07/02/2021 64.0 Osteopenia -1.7 0.795 g/cm2 - -   Left Forearm Radius 33% 08/16/2022 65.1 Osteopenia -1.6 0.596 g/cm2 -4.7% - Left Forearm Radius 33% 07/02/2021 64.0 Osteopenia -1.2 0.626 g/cm2  Assessment & Plan:   1.  Osteoporosis of hip  2.  Hypercalcemia   -I have reviewed her new and available bone density studies as well as her labs. - Based on these reviews, she has has now bone density findings consistent with osteoporosis of hip, worsening from her prior study in 2023.   Her  vertebral spine was not included in the study due to degenerative changes.  She has only taken Fosamax for 2 months before the bone density was repeated and that was not enough time to conclude that Fosamax was not working for her.  She remains on it without side effects.    However, this patient is interested to explore her next options of treatment. There is reasonable room  to intensify treatment in this patient with high risk of fracture given her history of scoliosis as well as disequilibrium. Options of treatment were discussed with her.  This included switching her oral bisphosphonate to IV zoledronic acid which will confer estimated 70% risk reduction on vertebral fracture and 41% risk reduction on hip fracture. She was also given the option of Prolia which will confer an estimated 68% risk reduction for vertebral fracture and 40% risk reduction in hip fracture. Although she has mild CKD, her GFR is at 32 which would not preclude utility of Reclast. I shared the pros and cons of this treatment  options including the fact that when she started Prolia it cannot be stopped for risk of atypical fracture. She also has a relatively long duration of treatment expected. Factoring all this attributes, the family opts into IV Reclast yearly instead of Prolia. Yearly IV Reclast treatment for 3-6 years with potentially give her a drug holiday depending on her response and if she develops worsening osteoporosis, she can always be considered for Prolia at the end. She does have mild PTH independent hypercalcemia which may respond favorably to IV Reclast treatment.   Fall precautions discussed with her.  Her dietary intake of protein seems to be adequate.   Her repeat for 6 thyroid function test consistent with euthyroid state that she will not need intervention with thyroid hormone. She was also found to have mild hypercalcemia associated with normal PTH.  She is advised to discontinue her calcium  supplements, however continue on vitamin D supplements.  Reclast infusion will be undertaken at [facility] infusion center when her prescription is procured and she will be contacted. she is advised to discontinue Fosamax after her first infusion. -Patient will return in 6 months with CMP for interval renal function and treatment assessment.  Repeat  bone density will be requested in a year to assess treatment effect.   - she is advised to maintain close follow up with Durwin Nora Lucina Mellow, MD for primary care needs.   I spent  25  minutes in the care of the patient today including review of labs from Thyroid Function, CMP, and other relevant labs ; imaging/biopsy records (current and previous including abstractions from other facilities); face-to-face time discussing  her lab results and symptoms, medications doses, her options of short and long term treatment based on the latest standards of care / guidelines;   and documenting the encounter.  Erica Gallegos and her husband participated in the discussions, expressed understanding, and voiced agreement with the above plans.  All questions were answered to her satisfaction. she is encouraged to contact clinic should she have any questions or concerns prior to her return visit.   Follow up plan: Return in about 6 months (around 04/18/2023) for Refer her for Reclast Infusion.   Marquis Lunch, MD [facility] [address] [place], [place] [address] Phone: [phone]  Fax: [phone]     10/18/2022, 1:54 PM  This note was partially dictated with voice recognition software. Similar sounding words can be transcribed inadequately or may not  be corrected upon review.

## 2022-10-19 DIAGNOSIS — G4733 Obstructive sleep apnea (adult) (pediatric): Secondary | ICD-10-CM | POA: Diagnosis not present

## 2022-10-22 ENCOUNTER — Telehealth: Payer: Self-pay

## 2022-10-22 ENCOUNTER — Other Ambulatory Visit: Payer: Self-pay | Admitting: "Endocrinology

## 2022-10-22 DIAGNOSIS — M85852 Other specified disorders of bone density and structure, left thigh: Secondary | ICD-10-CM

## 2022-10-22 NOTE — Telephone Encounter (Signed)
Pt aware. Will check to see if PA is required and send order for Reclast.

## 2022-10-22 NOTE — Telephone Encounter (Signed)
-----   Message from Marquis Lunch sent at 10/18/2022 10:33 AM EDT ----- Ander Slade, This patient will need referral for Reclast infusion.

## 2022-10-23 ENCOUNTER — Telehealth: Payer: Self-pay

## 2022-10-23 ENCOUNTER — Other Ambulatory Visit: Payer: Self-pay

## 2022-10-23 DIAGNOSIS — M48062 Spinal stenosis, lumbar region with neurogenic claudication: Secondary | ICD-10-CM | POA: Diagnosis not present

## 2022-10-23 NOTE — Telephone Encounter (Signed)
Auth Submission: NO AUTH NEEDED Site of care: Site of care: [facility] Payer: BCBS Medicare Medication & CPT/J Code(s) submitted: Reclast (Zolendronic acid) [id number] Route of submission (phone, fax, portal): phone Phone # Fax # Auth type: Buy/Bill PB Units/visits requested: 5mg . 1 dose Reference number: [id number]  Approval from: 10/23/22 to 01/07/23

## 2022-10-25 ENCOUNTER — Encounter: Payer: Self-pay | Admitting: "Endocrinology

## 2022-10-27 IMAGING — MR MR LUMBAR SPINE W/O CM
4 of 5 series · 30 of 48 positions shown · non-contrast
Comparison: X-ray lumbar 04/02/2021.

CLINICAL DATA: Low back with bilateral hand and leg numbness for
over 5 months.

EXAM:
MRI LUMBAR SPINE WITHOUT CONTRAST
TECHNIQUE: Multiplanar, multisequence MR imaging of the lumbar spine was
performed. No intravenous contrast was administered.

[Series 9: T2 · sagittal · 4.0mm · 0.68mm/px · 6 of 17 slices shown (1 of 2)]
[im 1/17]
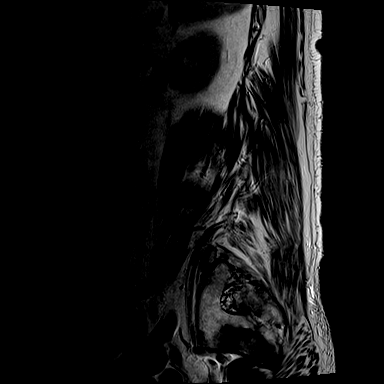
[im 4/17]
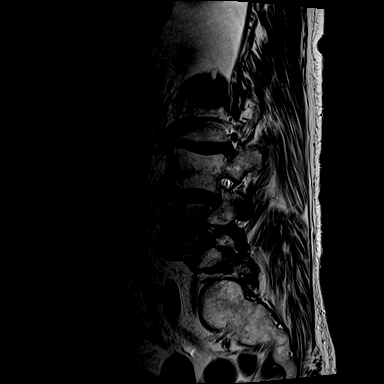
[im 7/17]
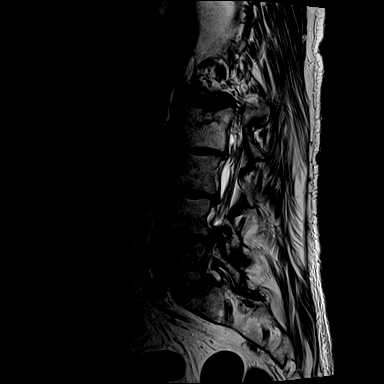
[im 10/17]
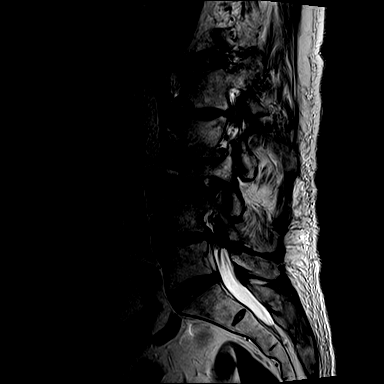
[im 13/17]
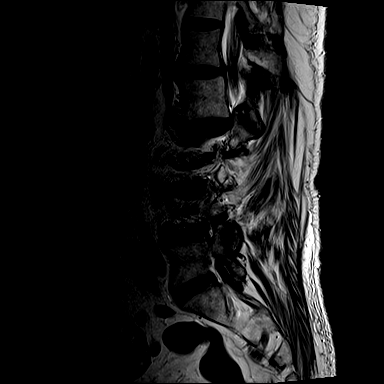
[im 17/17]
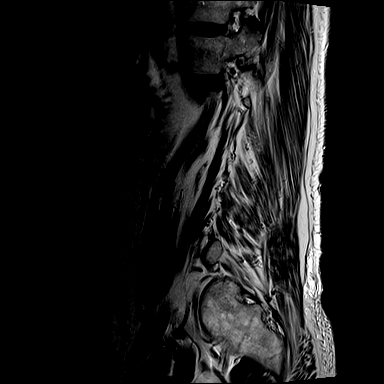

[Series 10: T1 · sagittal · 4.0mm · 0.81mm/px · 6 of 15 slices shown (1 of 2)]
[im 1/15]
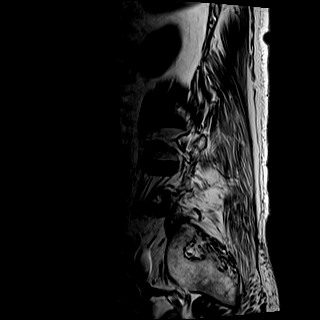
[im 3/15]
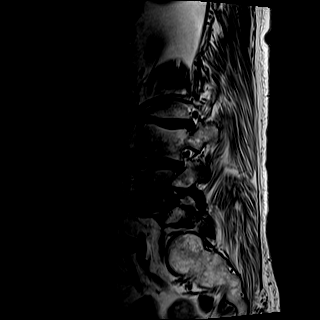
[im 6/15]
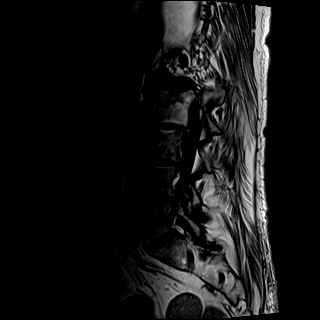
[im 9/15]
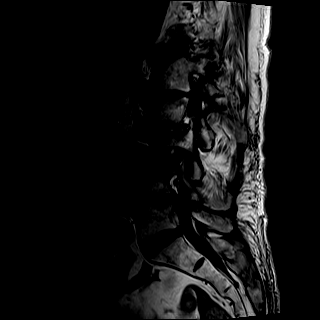
[im 12/15]
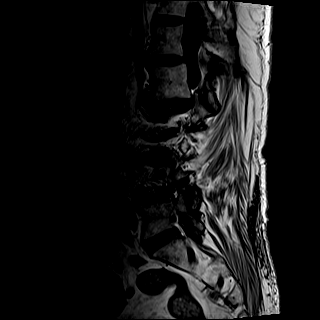
[im 15/15]
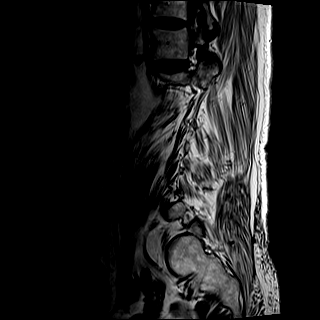

[Series 12: T2 · axial · 4.0mm · 0.70mm/px · z∈[-479,-304]mm · 9 of 37 slices shown (2 of 2)]
[im 1/37]
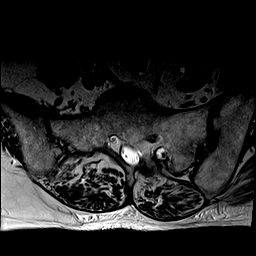
[im 6/37]
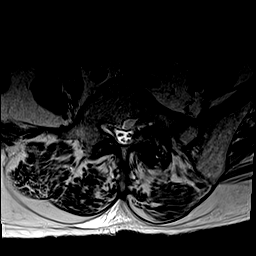
[im 11/37]
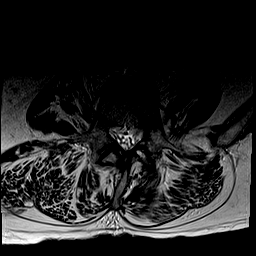
[im 16/37]
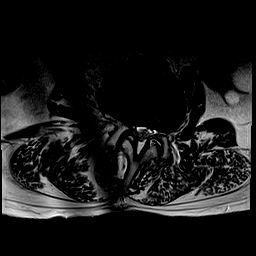
[im 19/37]
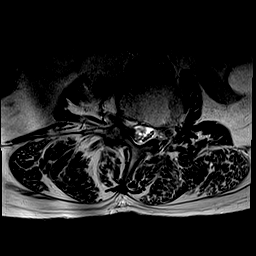
[im 21/37]
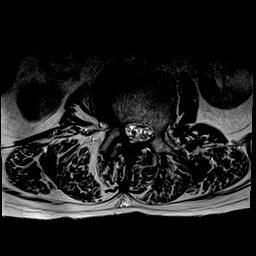
[im 26/37]
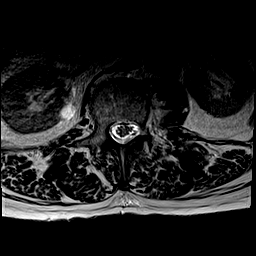
[im 31/37]
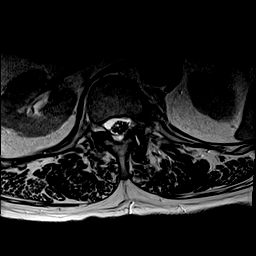
[im 37/37]
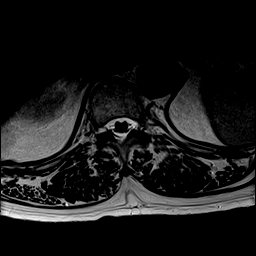

[Series 13: T1 · axial · 4.0mm · 0.35mm/px · z∈[-479,-304]mm · 9 of 37 slices shown (2 of 2)]
[im 1/37]
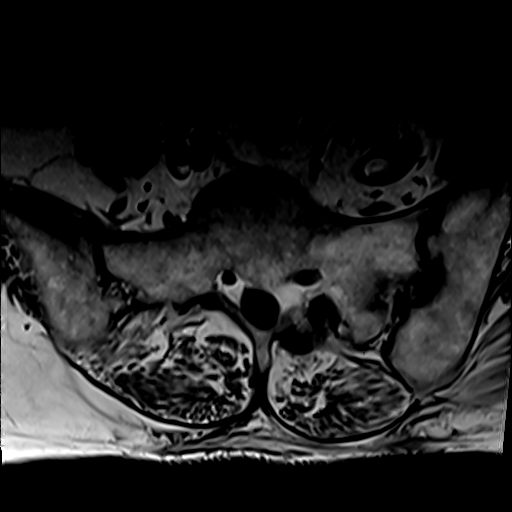
[im 6/37]
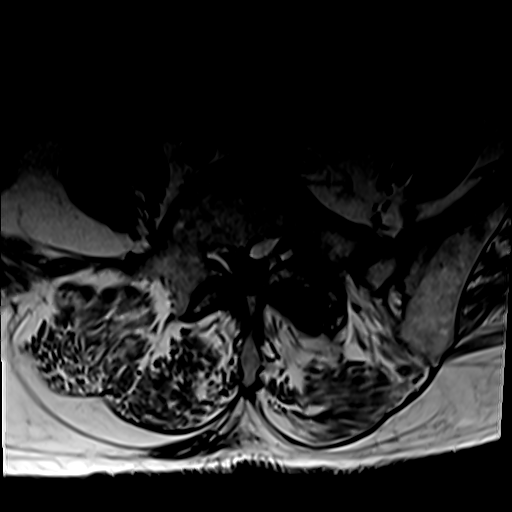
[im 11/37]
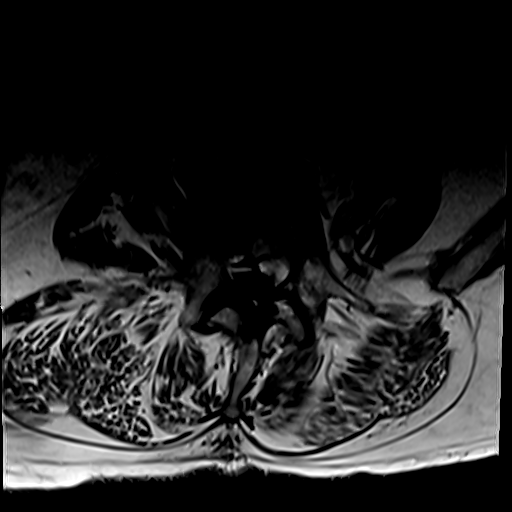
[im 16/37]
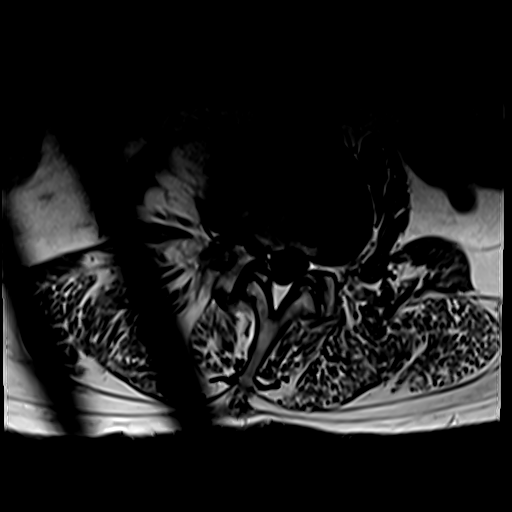
[im 19/37]
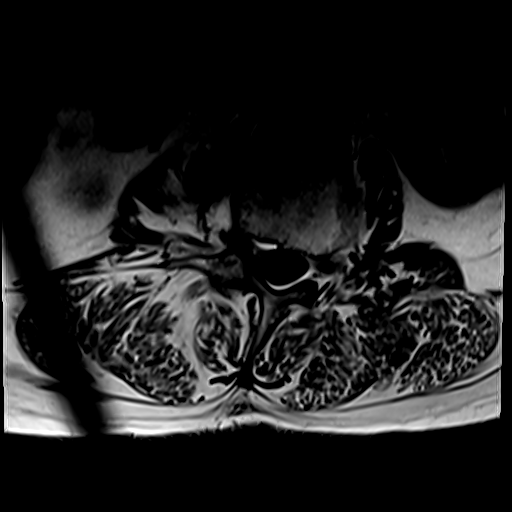
[im 21/37]
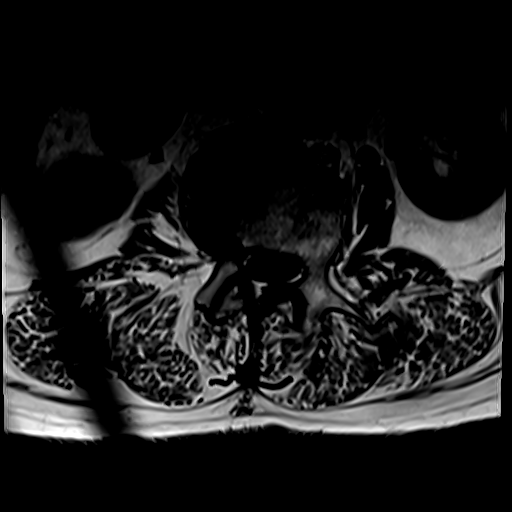
[im 26/37]
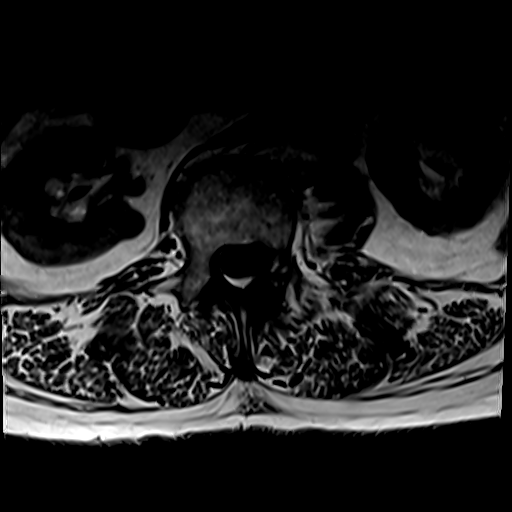
[im 31/37]
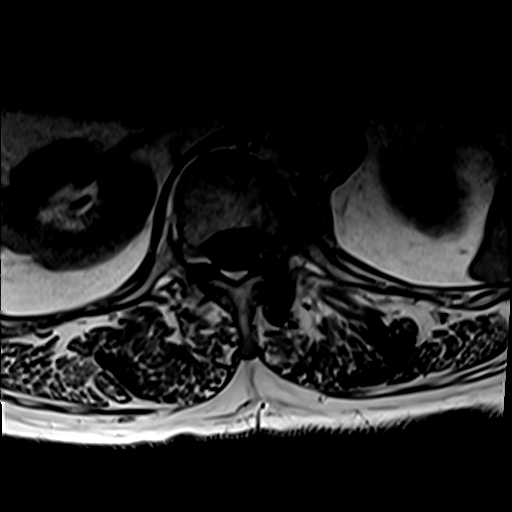
[im 37/37]
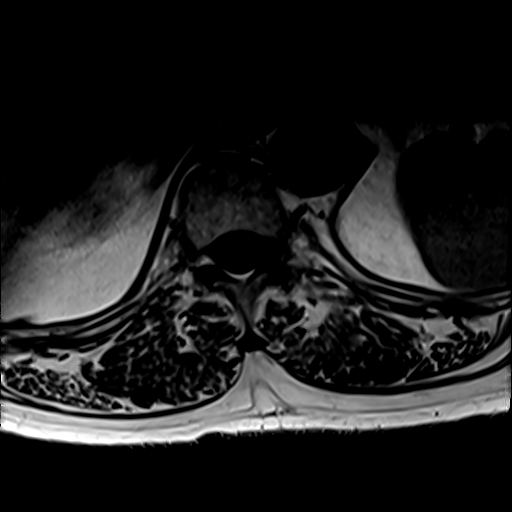

[30 of 48 positions shown; findings below may reference images not displayed]

FINDINGS: Segmentation: 5 lumbar-type vertebral bodies with partial
sacralization of L5 on the left.

Alignment: Scoliosis, with lumbar levocurvature apex at L3-L4. Trace
anterolisthesis T12 on L1. Trace retrolisthesis L2 on L3 and L3 on
L4.

Vertebrae:  No acute fracture or suspicious osseous lesion.

Conus medullaris and cauda equina: Conus extends to the T12-L1
level. Conus and cauda equina appear normal.

Paraspinal and other soft tissues: Atrophy of the superior right
psoas. Renal cysts.

Disc levels:

T12-L1: Left eccentric disc bulge. Moderate to severe facet
arthropathy. Mild spinal canal stenosis. No definite neural
foraminal narrowing.

L1-L2: Moderate disc bulge with superimposed right subarticular
protrusion. Moderate facet arthropathy. Severe spinal canal
stenosis. Effacement of the lateral recesses. No definite neural
foraminal narrowing.

L2-L3: Disc height loss with right eccentric disc osteophyte
complex. Moderate facet arthropathy. Mild to moderate spinal canal
stenosis. Narrowing of the right-greater-than-left lateral recesses.
Mild left and moderate right neural foraminal narrowing.

L3-L4: Broad-based disc bulge. Moderate facet arthropathy. Mild to
moderate spinal canal stenosis. Narrowing of the lateral recesses.
Mild left and moderate right neural foraminal narrowing.

L4-L5: Left eccentric disc bulge with right foraminal annular
fissure. Moderate to severe facet arthropathy. Ligamentum flavum
hypertrophy. Severe spinal canal stenosis. Effacement of the lateral
recesses. Moderate to severe left and moderate right neural
foraminal narrowing.

L5-S1: Left eccentric disc bulge. Severe left and moderate right
facet arthropathy. No spinal canal stenosis. Mild right and moderate
to severe left neural foraminal narrowing.
IMPRESSION: 1. L4-L5 severe spinal canal stenosis, with moderate to severe left
and moderate right neural foraminal narrowing. Effacement of the
lateral recesses at this level likely compresses the descending L5
nerve roots.
2. L1-L2 severe spinal canal stenosis. Effacement of the lateral
recesses at this level likely compresses the descending L2 nerve
roots.
3. L2-L3 and L3-L4 mild-to-moderate spinal canal stenosis with
moderate right and mild left neural foraminal narrowing. Narrowing
of the lateral recesses at these levels could affect the descending
L3 and L4 nerve roots, respectively.
4. T12-L1 mild spinal canal stenosis.
5. L5-S1 moderate to severe left and mild right neural foraminal
narrowing.

## 2022-10-30 ENCOUNTER — Encounter: Payer: Medicare Other | Attending: "Endocrinology | Admitting: Emergency Medicine

## 2022-10-30 ENCOUNTER — Other Ambulatory Visit: Payer: Self-pay | Admitting: Internal Medicine

## 2022-10-30 VITALS — BP 128/80 | HR 54 | Temp 98.2°F | Resp 18

## 2022-10-30 DIAGNOSIS — M810 Age-related osteoporosis without current pathological fracture: Secondary | ICD-10-CM

## 2022-10-30 MED ORDER — ACETAMINOPHEN 325 MG PO TABS
650.0000 mg | ORAL_TABLET | Freq: Once | ORAL | Status: AC
  Administered 2022-10-30: 650 mg via ORAL
  Filled 2022-10-30: qty 2

## 2022-10-30 MED ORDER — ZOLEDRONIC ACID 5 MG/100ML IV SOLN
5.0000 mg | Freq: Once | INTRAVENOUS | Status: AC
Start: 2022-10-30 — End: 2022-10-30
  Administered 2022-10-30: 5 mg via INTRAVENOUS
  Filled 2022-10-30: qty 100

## 2022-10-30 MED ORDER — DIPHENHYDRAMINE HCL 25 MG PO CAPS
25.0000 mg | ORAL_CAPSULE | Freq: Once | ORAL | Status: AC
  Administered 2022-10-30: 25 mg via ORAL
  Filled 2022-10-30: qty 1

## 2022-10-30 NOTE — Progress Notes (Signed)
Diagnosis: Osteoporosis  Provider:  Danella Deis MD  Procedure: IV Infusion  IV Type: Peripheral, IV Location: L Antecubital  Reclast (Zolendronic Acid), Dose: 5 mg  Infusion Start Time: 1138  Infusion Stop Time: 1209  Post Infusion IV Care: Observation period completed and Peripheral IV Discontinued  Discharge: Condition: Good, Destination: Home . AVS Provided  Performed by:  Arrie Senate, RN

## 2022-10-31 ENCOUNTER — Encounter (HOSPITAL_COMMUNITY): Payer: Self-pay | Admitting: Psychiatry

## 2022-10-31 ENCOUNTER — Ambulatory Visit (HOSPITAL_COMMUNITY): Payer: Medicare Other | Admitting: Psychiatry

## 2022-10-31 VITALS — BP 170/81 | HR 58 | Resp 18 | Ht 65.0 in | Wt 152.4 lb

## 2022-10-31 DIAGNOSIS — F32A Depression, unspecified: Secondary | ICD-10-CM | POA: Diagnosis not present

## 2022-10-31 DIAGNOSIS — F419 Anxiety disorder, unspecified: Secondary | ICD-10-CM

## 2022-10-31 DIAGNOSIS — F4310 Post-traumatic stress disorder, unspecified: Secondary | ICD-10-CM | POA: Diagnosis not present
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
## 2022-11-01 ENCOUNTER — Other Ambulatory Visit: Payer: Self-pay | Admitting: Internal Medicine

## 2022-11-01 DIAGNOSIS — F419 Anxiety disorder, unspecified: Secondary | ICD-10-CM

## 2022-11-04 ENCOUNTER — Other Ambulatory Visit: Payer: Self-pay | Admitting: Internal Medicine

## 2022-11-07 ENCOUNTER — Other Ambulatory Visit: Payer: Self-pay | Admitting: Internal Medicine

## 2022-11-12 ENCOUNTER — Encounter (HOSPITAL_COMMUNITY): Payer: Self-pay

## 2022-11-12 ENCOUNTER — Ambulatory Visit (INDEPENDENT_AMBULATORY_CARE_PROVIDER_SITE_OTHER): Payer: Medicare Other | Admitting: Licensed Clinical Social Worker

## 2022-11-12 DIAGNOSIS — F419 Anxiety disorder, unspecified: Secondary | ICD-10-CM

## 2022-11-12 DIAGNOSIS — F4310 Post-traumatic stress disorder, unspecified: Secondary | ICD-10-CM | POA: Diagnosis not present

## 2022-11-12 DIAGNOSIS — F32A Depression, unspecified: Secondary | ICD-10-CM | POA: Diagnosis not present
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
## 2022-11-19 DIAGNOSIS — G4733 Obstructive sleep apnea (adult) (pediatric): Secondary | ICD-10-CM | POA: Diagnosis not present

## 2022-11-20 DIAGNOSIS — M816 Localized osteoporosis [Lequesne]: Secondary | ICD-10-CM | POA: Diagnosis not present

## 2022-11-21 ENCOUNTER — Telehealth: Payer: Self-pay | Admitting: "Endocrinology

## 2022-11-21 LAB — VITAMIN D 25 HYDROXY (VIT D DEFICIENCY, FRACTURES): Vit D, 25-Hydroxy: 42.8 ng/mL (ref 30.0–100.0)

## 2022-11-21 LAB — PTH, INTACT AND CALCIUM: PTH: 69 pg/mL — ABNORMAL HIGH (ref 15–65)

## 2022-11-21 LAB — COMPREHENSIVE METABOLIC PANEL
ALT: 199 [IU]/L — ABNORMAL HIGH (ref 0–32)
AST: 128 [IU]/L — ABNORMAL HIGH (ref 0–40)
Albumin: 4 g/dL (ref 3.9–4.9)
Alkaline Phosphatase: 75 [IU]/L (ref 44–121)
BUN/Creatinine Ratio: 18 (ref 12–28)
BUN: 29 mg/dL — ABNORMAL HIGH (ref 8–27)
Bilirubin Total: 0.4 mg/dL (ref 0.0–1.2)
CO2: 20 mmol/L (ref 20–29)
Calcium: 9.3 mg/dL (ref 8.7–10.3)
Chloride: 108 mmol/L — ABNORMAL HIGH (ref 96–106)
Creatinine, Ser: 1.64 mg/dL — ABNORMAL HIGH (ref 0.57–1.00)
Globulin, Total: 1.9 g/dL (ref 1.5–4.5)
Glucose: 80 mg/dL (ref 70–99)
Potassium: 5.2 mmol/L (ref 3.5–5.2)
Sodium: 141 mmol/L (ref 134–144)
Total Protein: 5.9 g/dL — ABNORMAL LOW (ref 6.0–8.5)
eGFR: 35 mL/min/{1.73_m2} — ABNORMAL LOW (ref >59)

## 2022-11-21 LAB — TSH: TSH: 1.12 u[IU]/mL (ref 0.450–4.500)

## 2022-11-21 LAB — MAGNESIUM: Magnesium: 2 mg/dL (ref 1.6–2.3)

## 2022-11-21 LAB — PHOSPHORUS: Phosphorus: 2.8 mg/dL — ABNORMAL LOW (ref 3.0–4.3)

## 2022-11-21 LAB — T4, FREE: Free T4: 0.95 ng/dL (ref 0.82–1.77)

## 2022-11-21 NOTE — Telephone Encounter (Signed)
Pt did her labs and her appt is in April. Can you place new lab orders and I will call pt

## 2022-11-26 ENCOUNTER — Ambulatory Visit (HOSPITAL_COMMUNITY): Payer: Medicare Other | Admitting: Licensed Clinical Social Worker

## 2022-11-26 ENCOUNTER — Telehealth (HOSPITAL_COMMUNITY): Payer: Self-pay | Admitting: Licensed Clinical Social Worker

## 2022-11-26 DIAGNOSIS — F4310 Post-traumatic stress disorder, unspecified: Secondary | ICD-10-CM

## 2022-11-26 DIAGNOSIS — F419 Anxiety disorder, unspecified: Secondary | ICD-10-CM | POA: Diagnosis not present

## 2022-11-26 DIAGNOSIS — F32A Depression, unspecified: Secondary | ICD-10-CM | POA: Diagnosis not present

## 2022-11-26 NOTE — Progress Notes (Signed)
THERAPIST PROGRESS NOTE   Session Date: 11/26/2022  Session Time: 0900 - 1000  Participation Level: Active  Behavioral Response: Casual, Neat, and Well GroomedAlertEuthymic and Irritable  Type of Therapy: Individual Therapy  Treatment Goals addressed:  - LTG: Reduce frequency, intensity, and duration of depression symptoms so that daily functioning is improved (OP Depression) - LTG: Increase coping skills to manage depression and improve ability to perform daily activities (OP Depression) - STG: Bentlei "Erica Gallegos" will reduce frequency of avoidant behaviors by 50% as evidenced by self-report in therapy sessions (Anxiety) - LTG: Erica Gallegos" will score less than 5 on the Generalized Anxiety Disorder 7 Scale (GAD-7) (Anxiety) - STG: Report a decrease in anxiety symptoms as evidenced by an overall reduction in anxiety score by a minimum of 25% on the Generalized Anxiety Disorder Scale (GAD-7) (Anxiety)  ProgressTowards Goals: Not Progressing  Interventions: CBT, Motivational Interviewing, and Supportive  Summary: Erica Gallegos is a 67 y.o. female with past psych history of anxiety, depression, and PTSD, presenting for follow-up therapy session in efforts to improve management of depressive and anxious symptoms. Patient actively engaged in session, detailing past two weeks and presenting stressors experienced across settings. Pt engaged in re-administering of PHQ9 and GAD7, processing increased instances of depressive and anxious sxs experienced over the past two weeks, detailing various factors pt believes to be primary stressors impacting herself and overall relationship with spouse. Pt detailed feeling restricted by spouses financial stress and fears of potential of outliving savings, as well as stress surrounding limitations of physical abilities at this time while pending appropriateness for surgery. Patient responded well to interventions. Patient continues to meet criteria for Depression,  Anxiety, and PTSD . Patient will continue to benefit from engagement in outpatient therapy due to being the least restrictive service to meet presenting needs.     11/26/2022    9:06 AM 11/12/2022    1:19 PM 06/18/2022   11:05 AM 02/26/2022   10:27 AM  GAD 7 : Generalized Anxiety Score  Nervous, Anxious, on Edge 3 1 0 1  Control/stop worrying 1 0 0 0  Worry too much - different things 3 3 0 1  Trouble relaxing 1 2 0 1  Restless 0 0 0 0  Easily annoyed or irritable 3 3 0 2  Afraid - awful might happen 1 0 0 0  Total GAD 7 Score 12 9 0 5  Anxiety Difficulty Very difficult Not difficult at all  Not difficult at all      11/26/2022    9:52 AM 11/12/2022    1:22 PM 09/27/2022    9:51 AM 08/30/2022   11:09 AM 08/30/2022   11:07 AM  Depression screen PHQ 2/9  Decreased Interest 3 1 0 0 0  Down, Depressed, Hopeless 2 0 0 0 0  PHQ - 2 Score 5 1 0 0 0  Altered sleeping 3 1     Tired, decreased energy 2 2     Change in appetite 0 1     Feeling bad or failure about yourself  3 0     Trouble concentrating 3 1     Moving slowly or fidgety/restless 0 0     Suicidal thoughts 0 0     PHQ-9 Score 16 6     Difficult doing work/chores Extremely dIfficult        Suicidal/Homicidal: No  Therapist Response: Clinician utilized CBT, MI, and supportive reflection techniques to address pt presenting sxs and identified challenges. Re-administered PHQ9 and GAD7  in tracking pt's presenting sxs, engaging pt in processing variations in scoring and presence of various stressors pt has experienced over the past two weeks. Assigned pt tasks to explore opportunities at engaging in physical activity through programs at local [employer] to increase mobility, activity, and social interactions. Therapist provided support and empathy to patient during session.  Plan: Return again in 2 weeks.  Diagnosis:  Encounter Diagnoses  Name Primary?   Anxiety and depression Yes   PTSD (post-traumatic stress disorder)      Collaboration of Care: Other None necessary at this time.  Patient/Guardian was advised Release of Information must be obtained prior to any record release in order to collaborate their care with an outside provider. Patient/Guardian was advised if they have not already done so to contact the registration department to sign all necessary forms in order for [place] to release information regarding their care.   Consent: Patient/Guardian gives verbal consent for treatment and assignment of benefits for services provided during this visit. Patient/Guardian expressed understanding and agreed to proceed.   Leisa Lenz, MSW, LCSW 11/26/2022,  8:59 AM

## 2022-11-28 ENCOUNTER — Other Ambulatory Visit: Payer: Self-pay

## 2022-11-28 DIAGNOSIS — R7989 Other specified abnormal findings of blood chemistry: Secondary | ICD-10-CM

## 2022-11-28 NOTE — Telephone Encounter (Signed)
Labs updated and sent to [facility]. 

## 2022-12-10 ENCOUNTER — Ambulatory Visit (HOSPITAL_COMMUNITY): Payer: Medicare Other | Admitting: Licensed Clinical Social Worker

## 2022-12-11 ENCOUNTER — Other Ambulatory Visit: Payer: Self-pay | Admitting: Internal Medicine

## 2022-12-14 ENCOUNTER — Other Ambulatory Visit: Payer: Self-pay | Admitting: Internal Medicine

## 2022-12-16 ENCOUNTER — Ambulatory Visit (HOSPITAL_COMMUNITY): Payer: Medicare Other | Admitting: Licensed Clinical Social Worker

## 2022-12-16 DIAGNOSIS — F32A Depression, unspecified: Secondary | ICD-10-CM

## 2022-12-16 DIAGNOSIS — F419 Anxiety disorder, unspecified: Secondary | ICD-10-CM

## 2022-12-16 NOTE — Progress Notes (Signed)
THERAPIST PROGRESS NOTE   Session Date: 12/16/2022  Session Time: 1316 - 1402  Participation Level: Active  Behavioral Response: Casual, Neat, and Well GroomedAlertEuthymic and Irritable  Type of Therapy: Individual Therapy  Treatment Goals addressed:  - LTG: Reduce frequency, intensity, and duration of depression symptoms so that daily functioning is improved (OP Depression) - LTG: Increase coping skills to manage depression and improve ability to perform daily activities (OP Depression) - STG: Lalonnie "[name]" will reduce frequency of avoidant behaviors by 50% as evidenced by self-report in therapy sessions (Anxiety) - LTG: Nicholos JohnsOlegario Messier" will score less than 5 on the Generalized Anxiety Disorder 7 Scale (GAD-7) (Anxiety) - STG: Report a decrease in anxiety symptoms as evidenced by an overall reduction in anxiety score by a minimum of 25% on the Generalized Anxiety Disorder Scale (GAD-7) (Anxiety)  ProgressTowards Goals: Not Progressing  Interventions: CBT, Motivational Interviewing, and Supportive  Summary: [name] is a 67 y.o. female with past psych history of anxiety, depression, and PTSD, presenting for follow-up therapy session in efforts to improve management of depressive and anxious symptoms. Patient actively engaged in session, detailing events of the past 2 weeks, sharing of time spent with the daughter during daughter's visit, as well as in-laws visiting for holidays.  Patient further detailed continued increase in stress and conflict between she and spouse, resulting in patient opting to engage minimally with husband due to husband's difficulties in managing his moods.  Patient expressed desires of wanting to spend time by herself away from spouse and efforts to decompress and avoid continued conflict, further engaging in processing history behind increased conflict and the correlation with both patient and spouse retiring.  Revisited assigned homework, patient acknowledged  having forgotten what assigned tasks previously where, further exploring homework to seek out social supports at either [employer], community groups, or local churches, in efforts to increase patient's socialization. Patient responded well to interventions. Patient continues to meet criteria for Depression, Anxiety, and PTSD . Patient will continue to benefit from engagement in outpatient therapy due to being the least restrictive service to meet presenting needs.      11/26/2022    9:06 AM 11/12/2022    1:19 PM 06/18/2022   11:05 AM 02/26/2022   10:27 AM  GAD 7 : Generalized Anxiety Score  Nervous, Anxious, on Edge 3 1 0 1  Control/stop worrying 1 0 0 0  Worry too much - different things 3 3 0 1  Trouble relaxing 1 2 0 1  Restless 0 0 0 0  Easily annoyed or irritable 3 3 0 2  Afraid - awful might happen 1 0 0 0  Total GAD 7 Score 12 9 0 5  Anxiety Difficulty Very difficult Not difficult at all  Not difficult at all      11/26/2022    9:52 AM 11/12/2022    1:22 PM 09/27/2022    9:51 AM 08/30/2022   11:09 AM 08/30/2022   11:07 AM  Depression screen PHQ 2/9  Decreased Interest 3 1 0 0 0  Down, Depressed, Hopeless 2 0 0 0 0  PHQ - 2 Score 5 1 0 0 0  Altered sleeping 3 1     Tired, decreased energy 2 2     Change in appetite 0 1     Feeling bad or failure about yourself  3 0     Trouble concentrating 3 1     Moving slowly or fidgety/restless 0 0     Suicidal thoughts  0 0     PHQ-9 Score 16 6     Difficult doing work/chores Extremely dIfficult        Suicidal/Homicidal: No  Therapist Response: Clinician utilized CBT, MI, and supportive reflection techniques to address pt presenting sxs and identified challenges.  Supported patient in eliciting the patient's recounts of recent events over the past 2 weeks since previous session, exploring events engaged in while daughter was visiting 2 weeks ago, further evoking patient's thoughts and feelings surrounding time spent with family and  challenges related to ongoing increasing conflict between patient and spouse.  Revisited previously assigned homework for pt to explore opportunities at engaging in physical activity through programs at [employer], community groups, or churches to increase mobility, activity, and social interactions. Therapist provided support and empathy to patient during session.  Plan: Return again in 3 weeks.  Diagnosis:  Encounter Diagnosis  Name Primary?   Anxiety and depression Yes     Collaboration of Care: Other None necessary at this time.  Patient/Guardian was advised Release of Information must be obtained prior to any record release in order to collaborate their care with an outside provider. Patient/Guardian was advised if they have not already done so to contact the registration department to sign all necessary forms in order for [place] to release information regarding their care.   Consent: Patient/Guardian gives verbal consent for treatment and assignment of benefits for services provided during this visit. Patient/Guardian expressed understanding and agreed to proceed.   Leisa Lenz, MSW, LCSW 12/16/2022,  9:23 PM

## 2022-12-19 DIAGNOSIS — G4733 Obstructive sleep apnea (adult) (pediatric): Secondary | ICD-10-CM | POA: Diagnosis not present

## 2022-12-24 ENCOUNTER — Ambulatory Visit (HOSPITAL_COMMUNITY): Payer: Medicare Other | Admitting: Licensed Clinical Social Worker

## 2022-12-24 ENCOUNTER — Other Ambulatory Visit: Payer: Self-pay | Admitting: Internal Medicine

## 2022-12-24 DIAGNOSIS — F419 Anxiety disorder, unspecified: Secondary | ICD-10-CM

## 2022-12-25 ENCOUNTER — Ambulatory Visit: Payer: Medicare Other | Admitting: "Endocrinology

## 2022-12-25 ENCOUNTER — Encounter: Payer: Self-pay | Admitting: Gastroenterology

## 2022-12-26 ENCOUNTER — Encounter: Payer: Self-pay | Admitting: Internal Medicine

## 2022-12-26 ENCOUNTER — Ambulatory Visit (INDEPENDENT_AMBULATORY_CARE_PROVIDER_SITE_OTHER): Payer: Medicare Other | Admitting: Internal Medicine

## 2022-12-26 VITALS — BP 136/78 | HR 67 | Ht 66.0 in | Wt 150.0 lb

## 2022-12-26 DIAGNOSIS — L299 Pruritus, unspecified: Secondary | ICD-10-CM | POA: Insufficient documentation

## 2022-12-26 DIAGNOSIS — F419 Anxiety disorder, unspecified: Secondary | ICD-10-CM

## 2022-12-26 DIAGNOSIS — F32A Depression, unspecified: Secondary | ICD-10-CM | POA: Diagnosis not present

## 2022-12-26 DIAGNOSIS — I10 Essential (primary) hypertension: Secondary | ICD-10-CM

## 2022-12-26 DIAGNOSIS — M810 Age-related osteoporosis without current pathological fracture: Secondary | ICD-10-CM

## 2022-12-26 DIAGNOSIS — K219 Gastro-esophageal reflux disease without esophagitis: Secondary | ICD-10-CM | POA: Diagnosis not present

## 2022-12-26 DIAGNOSIS — E782 Mixed hyperlipidemia: Secondary | ICD-10-CM

## 2022-12-26 MED ORDER — CLONAZEPAM 0.5 MG PO TABS: 0.5000 mg | ORAL_TABLET | Freq: Every evening | ORAL | 0 refills | Status: DC | PRN

## 2022-12-26 MED ORDER — HYDROXYZINE PAMOATE 25 MG PO CAPS
25.0000 mg | ORAL_CAPSULE | Freq: Three times a day (TID) | ORAL | 0 refills | Status: DC | PRN
Start: 2022-12-26 — End: 2023-01-22

## 2022-12-26 MED ORDER — CARVEDILOL 12.5 MG PO TABS: ORAL_TABLET | ORAL | 3 refills | Status: DC

## 2022-12-26 MED ORDER — CYCLOBENZAPRINE HCL 10 MG PO TABS: ORAL_TABLET | ORAL | 0 refills | Status: DC

## 2022-12-26 MED ORDER — LISINOPRIL 40 MG PO TABS: 40.0000 mg | ORAL_TABLET | Freq: Every day | ORAL | 1 refills | Status: DC

## 2022-12-26 MED ORDER — ESOMEPRAZOLE MAGNESIUM 20 MG PO CPDR: 20.0000 mg | DELAYED_RELEASE_CAPSULE | Freq: Every day | ORAL | 1 refills | Status: DC

## 2022-12-26 MED ORDER — ATORVASTATIN CALCIUM 40 MG PO TABS: 40.0000 mg | ORAL_TABLET | Freq: Every day | ORAL | 3 refills | Status: DC

## 2022-12-26 MED ORDER — DULOXETINE HCL 60 MG PO CPEP: 60.0000 mg | ORAL_CAPSULE | Freq: Every day | ORAL | 1 refills | Status: DC

## 2022-12-26 MED ORDER — HYDRALAZINE HCL 100 MG PO TABS: 100.0000 mg | ORAL_TABLET | Freq: Two times a day (BID) | ORAL | 3 refills | Status: DC

## 2022-12-26 MED ORDER — LAMOTRIGINE 25 MG PO TABS: 50.0000 mg | ORAL_TABLET | Freq: Every day | ORAL | 3 refills | Status: DC

## 2022-12-26 MED ORDER — SPIRONOLACTONE 25 MG PO TABS: ORAL_TABLET | ORAL | 1 refills | Status: DC

## 2022-12-26 MED ORDER — CHLORTHALIDONE 25 MG PO TABS: ORAL_TABLET | ORAL | 3 refills | Status: DC

## 2022-12-26 MED ORDER — EZETIMIBE 10 MG PO TABS: 10.0000 mg | ORAL_TABLET | Freq: Every day | ORAL | 3 refills | Status: DC

## 2022-12-26 NOTE — Patient Instructions (Signed)
It was a pleasure to see you today.  Thank you for giving [place] the opportunity to be involved in your care.  Below is a brief recap of your visit and next steps.  We will plan to see you again in 3 months.  Summary Add hydroxyzine for as needed itch relief Consider a daily moisturizer cream Medications refilled Follow up in 3 months

## 2022-12-26 NOTE — Progress Notes (Signed)
Established Patient Office Visit  Subjective   Patient ID: Erica Gallegos, female    DOB: Mar 05, 1955  Age: 67 y.o. MRN: [id number]  Chief Complaint  Patient presents with   Follow-up   Erica Gallegos returns to care today for routine follow-up.  Erica Gallegos was last evaluated by me on 9/20 at which time Erica Gallegos endorsed poorly controlled pain and frustration that surgical intervention for lumbar stenosis would be delayed in the setting of osteoporosis.  No medication changes were made.  In the interim, Erica Gallegos has been seen by endocrinology for follow-up and has established care with a new psychiatrist and counseling services. Erica Gallegos reports feeling fairly well today.  Her acute concern is generalized itching that has developed on each arm.  Erica Gallegos is interested in an as needed medication for itch relief.  Erica Gallegos does not have any additional concerns to discuss.  Past Medical History:  Diagnosis Date   Anxiety    Arthritis    Complication of anesthesia    Patient woke up during anesthesia with bunionectomy   GERD (gastroesophageal reflux disease)    HLD (hyperlipidemia)    HTN (hypertension)    Major depressive disorder    Migraine with aura    PONV (postoperative nausea and vomiting)    RAS (renal artery stenosis) (HCC)    Sleep apnea    Sleep related hypoxia    Spinal stenosis    Past Surgical History:  Procedure Laterality Date   BUNIONECTOMY     CATARACT EXTRACTION     CHOLECYSTECTOMY     COLONOSCOPY WITH PROPOFOL N/A 12/13/2021   Procedure: COLONOSCOPY WITH PROPOFOL;  Surgeon: Corbin Ade, MD;  Location: [facility];  Service: Endoscopy;  Laterality: N/A;  8:00am, asa 2   ESOPHAGOGASTRODUODENOSCOPY (EGD) WITH PROPOFOL N/A 04/05/2021   Procedure: ESOPHAGOGASTRODUODENOSCOPY (EGD) WITH PROPOFOL;  Surgeon: Corbin Ade, MD;  Location: [facility];  Service: Endoscopy;  Laterality: N/A;  10:15am   ESOPHAGOGASTRODUODENOSCOPY (EGD) WITH PROPOFOL N/A 12/13/2021   Procedure:  ESOPHAGOGASTRODUODENOSCOPY (EGD) WITH PROPOFOL;  Surgeon: Corbin Ade, MD;  Location: [facility];  Service: Endoscopy;  Laterality: N/A;   MALONEY DILATION N/A 04/05/2021   Procedure: Elease Hashimoto DILATION;  Surgeon: Corbin Ade, MD;  Location: [facility];  Service: Endoscopy;  Laterality: N/A;   MALONEY DILATION N/A 12/13/2021   Procedure: Elease Hashimoto DILATION;  Surgeon: Corbin Ade, MD;  Location: [facility];  Service: Endoscopy;  Laterality: N/A;   Social History   Tobacco Use   Smoking status: Former    Types: Cigarettes   Smokeless tobacco: Never   Tobacco comments:    Erica Gallegos quit smoking in the early 90's  Vaping Use   Vaping status: Every Day  Substance Use Topics   Alcohol use: Yes    Comment: seldom   Drug use: Yes    Types: Marijuana    Comment: CBD cream; Marijuana a couple times a week. helps with her anxiety and migraines.   Family History  Problem Relation Age of Onset   Cancer Mother    Hypertension Mother    Diabetes Mother    Heart disease Mother    Hyperlipidemia Mother    Cancer Father    Stroke Father    Hypertension Father    Heart disease Father    Hypertension Sister    Lupus Sister    Hypertension Sister    Colon polyps Sister    Cancer Brother    Brain cancer Brother  Non-Hodgkin's lymphoma Brother    Colon cancer Neg Hx    Allergies  Allergen Reactions   Atenolol Swelling   Amlodipine Swelling   Review of Systems  Skin:  Positive for itching (Generalized).  All other systems reviewed and are negative.    Objective:     BP 136/78   Pulse 67   Ht 5\' 6"  (1.676 m)   Wt 150 lb (68 kg)   SpO2 97%   BMI 24.21 kg/m  BP Readings from Last 3 Encounters:  12/26/22 136/78  10/31/22 (!) 170/81  10/30/22 128/80   Physical Exam Constitutional:      General: Erica Gallegos is not in acute distress.    Appearance: Normal appearance. Erica Gallegos is not toxic-appearing.  HENT:     Head: Normocephalic and atraumatic.     Right Ear: External ear  normal.     Left Ear: External ear normal.     Nose: Nose normal. No congestion or rhinorrhea.     Mouth/Throat:     Mouth: Mucous membranes are moist.     Pharynx: Oropharynx is clear. No oropharyngeal exudate or posterior oropharyngeal erythema.  Eyes:     General: No scleral icterus.    Extraocular Movements: Extraocular movements intact.     Conjunctiva/sclera: Conjunctivae normal.     Pupils: Pupils are equal, round, and reactive to light.  Cardiovascular:     Rate and Rhythm: Normal rate and regular rhythm.     Pulses: Normal pulses.     Heart sounds: Normal heart sounds. No murmur heard.    No friction rub. No gallop.  Pulmonary:     Effort: Pulmonary effort is normal.     Breath sounds: Normal breath sounds. No wheezing, rhonchi or rales.  Abdominal:     General: Abdomen is flat. Bowel sounds are normal. There is no distension.     Palpations: Abdomen is soft.     Tenderness: There is no abdominal tenderness.  Musculoskeletal:        General: No swelling. Normal range of motion.     Cervical back: Normal range of motion.     Right lower leg: No edema.     Left lower leg: No edema.  Lymphadenopathy:     Cervical: No cervical adenopathy.  Skin:    General: Skin is warm and dry.     Capillary Refill: Capillary refill takes less than 2 seconds.     Coloration: Skin is not jaundiced.  Neurological:     General: No focal deficit present.     Mental Status: Erica Gallegos is alert and oriented to person, place, and time.     Gait: Gait abnormal (Ambulates with walker).  Psychiatric:        Behavior: Behavior normal.   Last CBC Lab Results  Component Value Date   WBC 7.8 02/26/2022   HGB 12.3 02/26/2022   HCT 37.8 02/26/2022   MCV 96 02/26/2022   MCH 31.4 02/26/2022   RDW 12.5 02/26/2022   PLT 265 02/26/2022   Last metabolic panel Lab Results  Component Value Date   GLUCOSE 80 11/20/2022   NA 141 11/20/2022   K 5.2 11/20/2022   CL 108 (H) 11/20/2022   CO2 20 11/20/2022    BUN 29 (H) 11/20/2022   CREATININE 1.64 (H) 11/20/2022   EGFR 35 (L) 11/20/2022   CALCIUM 9.3 11/20/2022   PHOS 2.8 (L) 11/20/2022   PROT 5.9 (L) 11/20/2022   ALBUMIN 4.0 11/20/2022   LABGLOB 1.9 11/20/2022  AGRATIO 2.0 06/20/2022   BILITOT 0.4 11/20/2022   ALKPHOS 75 11/20/2022   AST 128 (H) 11/20/2022   ALT 199 (H) 11/20/2022   ANIONGAP 3 (L) 12/04/2021   Last lipids Lab Results  Component Value Date   CHOL 125 02/26/2022   HDL 46 02/26/2022   LDLCALC 53 02/26/2022   TRIG 153 (H) 02/26/2022   CHOLHDL 2.7 02/26/2022   Last hemoglobin A1c Lab Results  Component Value Date   HGBA1C 5.3 02/26/2022   Last thyroid functions Lab Results  Component Value Date   TSH 1.120 11/20/2022   Last vitamin D Lab Results  Component Value Date   VD25OH 42.8 11/20/2022   Last vitamin B12 and Folate Lab Results  Component Value Date   VITAMINB12 1,447 (H) 02/26/2022   FOLATE 8.4 02/26/2022     Assessment & Plan:   Problem List Items Addressed This Visit       Hypertension   Remains adequately controlled on current antihypertensive regimen.  Refills provided today.      Osteoporosis   T-score -2.7 at the neck of the left femur on DEXA from August 2024.  Followed by endocrinology and has recently started Reclast infusions.  Previously taking Fosamax.  Erica Gallegos will return to care for follow-up with endocrinology in April 2025.      Anxiety and depression   Erica Gallegos has established care with a new psychiatrist and reports that her mood is stable and anxiety adequately controlled.  Erica Gallegos remains on Wellbutrin, clonazepam, Lamictal, and Cymbalta.  PDMP reviewed.  Clonazepam refilled today.      Itching - Primary   Her acute concern today is generalized itching.  No lesions or rashes are appreciated on exam today.  Hydroxyzine added for as needed itch relief.       Return in about 3 months (around 03/26/2023).    Billie Lade, MD

## 2022-12-26 NOTE — Assessment & Plan Note (Signed)
Her acute concern today is generalized itching.  No lesions or rashes are appreciated on exam today.  Hydroxyzine added for as needed itch relief.

## 2022-12-26 NOTE — Assessment & Plan Note (Signed)
Remains adequately controlled on current antihypertensive regimen.  Refills provided today.

## 2022-12-26 NOTE — Assessment & Plan Note (Signed)
T-score -2.7 at the neck of the left femur on DEXA from August 2024.  Followed by endocrinology and has recently started Reclast infusions.  Previously taking Fosamax.  She will return to care for follow-up with endocrinology in April 2025.

## 2022-12-26 NOTE — Assessment & Plan Note (Signed)
She has established care with a new psychiatrist and reports that her mood is stable and anxiety adequately controlled.  She remains on Wellbutrin, clonazepam, Lamictal, and Cymbalta.  PDMP reviewed.  Clonazepam refilled today.

## 2023-01-09 ENCOUNTER — Ambulatory Visit (INDEPENDENT_AMBULATORY_CARE_PROVIDER_SITE_OTHER): Payer: Medicare Other | Admitting: Licensed Clinical Social Worker

## 2023-01-09 DIAGNOSIS — F419 Anxiety disorder, unspecified: Secondary | ICD-10-CM

## 2023-01-09 DIAGNOSIS — F4310 Post-traumatic stress disorder, unspecified: Secondary | ICD-10-CM

## 2023-01-09 DIAGNOSIS — F32A Depression, unspecified: Secondary | ICD-10-CM

## 2023-01-09 NOTE — Progress Notes (Signed)
 THERAPIST PROGRESS NOTE   Session Date: 01/09/2023  Session Time: 1100 - 1215  Participation Level: Active  Behavioral Response: Casual, Neat, and Well GroomedAlertDepressed, Euthymic, and tearful  Type of Therapy: Individual Therapy  Treatment Goals addressed:  - LTG: Reduce frequency, intensity, and duration of depression symptoms so that daily functioning is improved (OP Depression) - LTG: Increase coping skills to manage depression and improve ability to perform daily activities (OP Depression) - STG: Erica Gallegos will reduce frequency of avoidant behaviors by 50% as evidenced by self-report in therapy sessions (Anxiety) - LTG: Erica Gallegos will score less than 5 on the Generalized Anxiety Disorder 7 Scale (GAD-7) (Anxiety) - STG: Report a decrease in anxiety symptoms as evidenced by an overall reduction in anxiety score by a minimum of 25% on the Generalized Anxiety Disorder Scale (GAD-7) (Anxiety)  ProgressTowards Goals: Not Progressing  Interventions: CBT, Motivational Interviewing, and Supportive  Summary: Service is a 68 y.o. female with past psych history of anxiety, depression, and PTSD, presenting for follow-up therapy session in efforts to improve management of depressive and anxious symptoms. Patient actively engaged in session, varying between pleasant and depressed moods, accompanying tearfulness throughout. Pt provided updates from previous discussed areas, detailing of husbands willingness to support pt in attending senior community events in order to increase social interaction with individuals other than spouse. Actively reflected on hx of relationship and of pt being the more social partner, proving to detail hx of social events and interactions pt engaged in throughout early adulthood. Processed variances in social interaction/participation throughout life stages, exploring current status of increased isolation following retirement and limitations to pt's abilities  to drive self. Explored pt's understanding of ways in which values are developed and adjust throughout life stages, identifying variances in values between pt and spouse, and things that prove to be important to them both individually. Explored means in which pt could increase structure to daily life by incorporating weekly activity schedule to allocate days/times to certain tasks she enjoys.  Patient responded well to interventions. Patient continues to meet criteria for Depression, Anxiety, and PTSD . Patient will continue to benefit from engagement in outpatient therapy due to being the least restrictive service to meet presenting needs.      01/09/2023   12:07 PM 11/26/2022    9:06 AM 11/12/2022    1:19 PM 06/18/2022   11:05 AM  GAD 7 : Generalized Anxiety Score  Nervous, Anxious, on Edge 2 3 1  0  Control/stop worrying 2 1 0 0  Worry too much - different things 1 3 3  0  Trouble relaxing 1 1 2  0  Restless 0 0 0 0  Easily annoyed or irritable 3 3 3  0  Afraid - awful might happen 0 1 0 0  Total GAD 7 Score 9 12 9  0  Anxiety Difficulty Somewhat difficult Very difficult Not difficult at all       01/09/2023   12:10 PM 11/26/2022    9:52 AM 11/12/2022    1:22 PM 09/27/2022    9:51 AM 08/30/2022   11:09 AM  Depression screen PHQ 2/9  Decreased Interest 2 3 1  0 0  Down, Depressed, Hopeless 2 2 0 0 0  PHQ - 2 Score 4 5 1  0 0  Altered sleeping 3 3 1     Tired, decreased energy 3 2 2     Change in appetite 0 0 1    Feeling bad or failure about yourself  1 3 0  Trouble concentrating 3 3 1     Moving slowly or fidgety/restless 0 0 0    Suicidal thoughts 0 0 0    PHQ-9 Score 14 16 6     Difficult doing work/chores Somewhat difficult Extremely dIfficult       Suicidal/Homicidal: No  Therapist Response: Clinician utilized CBT, MI, and supportive reflection techniques to address pt presenting sxs and identified challenges.  Supported patient in actively listening to pt's recounts of events since  previous session, further evoking pt's thoughts and feelings surrounding previously explored stressors. Elicited pt's perspectives on observable improvements in moods since previous session, further processing pt's abilities to disregard negativity from spouse. Engaged pt in reflecting on hx of development and how values are developed and change throughout stages of life. Encouraged pt to explore ways in which she can create routine in daily activities. Therapist provided support and empathy to patient during session.  Plan: Return again in 3 weeks.  Diagnosis:  Encounter Diagnoses  Name Primary?   Anxiety and depression Yes   PTSD (post-traumatic stress disorder)       Collaboration of Care: Other None necessary at this time.  Patient/Guardian was advised Release of Information must be obtained prior to any record release in order to collaborate their care with an outside provider. Patient/Guardian was advised if they have not already done so to contact the registration department to sign all necessary forms in order for us  to release information regarding their care.   Consent: Patient/Guardian gives verbal consent for treatment and assignment of benefits for services provided during this visit. Patient/Guardian expressed understanding and agreed to proceed.   Erica Gallegos, MSW, LCSW 01/09/2023,  12:17 PM

## 2023-01-19 DIAGNOSIS — G4733 Obstructive sleep apnea (adult) (pediatric): Secondary | ICD-10-CM | POA: Diagnosis not present

## 2023-01-22 ENCOUNTER — Other Ambulatory Visit: Payer: Self-pay | Admitting: Internal Medicine

## 2023-01-22 ENCOUNTER — Telehealth (HOSPITAL_COMMUNITY): Payer: Self-pay

## 2023-01-22 DIAGNOSIS — L299 Pruritus, unspecified: Secondary | ICD-10-CM

## 2023-01-22 DIAGNOSIS — F419 Anxiety disorder, unspecified: Secondary | ICD-10-CM

## 2023-01-22 NOTE — Telephone Encounter (Signed)
 She is not only getting klonopin  but all other medications from PCP. They need to provide enough until her next appointment with me. Thanks

## 2023-01-22 NOTE — Telephone Encounter (Signed)
 Patient states that her PCP would like you to take over her klonopin  prescription since she is in mental health care now. Patient states she is not out, she has 3 - 4 days left but will need a refill before she sees you on 1/28. She takes 0.5 mg at bedtime. Please review and advise, thank you

## 2023-01-22 NOTE — Telephone Encounter (Signed)
 I have reached out to the PCP to see if they will refill until her appointment on 01/31/2023.

## 2023-01-22 NOTE — Telephone Encounter (Signed)
 Copied from CRM [id number]. Topic: Clinical - Prescription Issue >> Jan 22, 2023  4:45 PM Marval Slice C wrote: Reason for CRM: Ra[facility] called in requesting Dr. Kermit Ped to provide a courtesy bridge refill for the following medications for the patient until they are seen by their psychiatric physician. Reagan requests to be contacted at [phone] if the medications cannot be bridged as a courtesy. Thank you.  Medications: buPROPion  (WELLBUTRIN  SR) 200 MG 12 hr tablet  (0 refills available) clonazePAM  (KLONOPIN ) 0.5 MG tablet (0 refills available) DULoxetine  (CYMBALTA ) 60 MG capsule (has refills but may need more) hydrOXYzine  (VISTARIL ) 25 MG capsule (has refills but may need more) lamoTRIgine  (LAMICTAL ) 25 MG tablet (has refills but may need more)

## 2023-01-23 MED ORDER — LAMOTRIGINE 25 MG PO TABS: 50.0000 mg | ORAL_TABLET | Freq: Every day | ORAL | 3 refills | Status: DC

## 2023-01-23 MED ORDER — BUPROPION HCL ER (SR) 200 MG PO TB12: 200.0000 mg | ORAL_TABLET | Freq: Two times a day (BID) | ORAL | 3 refills | Status: DC

## 2023-01-23 MED ORDER — HYDROXYZINE PAMOATE 25 MG PO CAPS: 25.0000 mg | ORAL_CAPSULE | Freq: Three times a day (TID) | ORAL | 0 refills | Status: DC | PRN

## 2023-01-23 MED ORDER — CLONAZEPAM 0.5 MG PO TABS: 0.5000 mg | ORAL_TABLET | Freq: Every evening | ORAL | 0 refills | Status: DC | PRN

## 2023-01-24 DIAGNOSIS — M810 Age-related osteoporosis without current pathological fracture: Secondary | ICD-10-CM | POA: Diagnosis not present

## 2023-01-24 DIAGNOSIS — Z6825 Body mass index (BMI) 25.0-25.9, adult: Secondary | ICD-10-CM | POA: Diagnosis not present

## 2023-01-24 DIAGNOSIS — M48062 Spinal stenosis, lumbar region with neurogenic claudication: Secondary | ICD-10-CM | POA: Diagnosis not present

## 2023-01-24 DIAGNOSIS — M4316 Spondylolisthesis, lumbar region: Secondary | ICD-10-CM | POA: Diagnosis not present

## 2023-01-31 ENCOUNTER — Telehealth (HOSPITAL_BASED_OUTPATIENT_CLINIC_OR_DEPARTMENT_OTHER): Payer: Medicare Other | Admitting: Psychiatry

## 2023-01-31 ENCOUNTER — Encounter (HOSPITAL_COMMUNITY): Payer: Self-pay | Admitting: Psychiatry

## 2023-01-31 VITALS — Wt 150.0 lb

## 2023-01-31 DIAGNOSIS — F419 Anxiety disorder, unspecified: Secondary | ICD-10-CM | POA: Diagnosis not present

## 2023-01-31 DIAGNOSIS — F4310 Post-traumatic stress disorder, unspecified: Secondary | ICD-10-CM | POA: Diagnosis not present

## 2023-01-31 DIAGNOSIS — F32A Depression, unspecified: Secondary | ICD-10-CM | POA: Diagnosis not present

## 2023-01-31 MED ORDER — CLONAZEPAM 0.5 MG PO TABS: 0.5000 mg | ORAL_TABLET | Freq: Every evening | ORAL | 1 refills | Status: DC | PRN
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
## 2023-02-04 ENCOUNTER — Ambulatory Visit (HOSPITAL_COMMUNITY): Payer: Medicare Other | Admitting: Licensed Clinical Social Worker

## 2023-02-04 DIAGNOSIS — F419 Anxiety disorder, unspecified: Secondary | ICD-10-CM

## 2023-02-04 DIAGNOSIS — F32A Depression, unspecified: Secondary | ICD-10-CM

## 2023-02-04 DIAGNOSIS — F4310 Post-traumatic stress disorder, unspecified: Secondary | ICD-10-CM | POA: Diagnosis not present

## 2023-02-04 NOTE — Progress Notes (Unsigned)
THERAPIST PROGRESS NOTE   Session Date: 02/04/2023  Session Time: 1105 - 1159  Participation Level: Active  Behavioral Response: Neat and Well GroomedAlertEuthymic  Type of Therapy: Individual Therapy  Treatment Goals addressed:  - LTG: Reduce frequency, intensity, and duration of depression symptoms so that daily functioning is improved (OP Depression) - LTG: Increase coping skills to manage depression and improve ability to perform daily activities (OP Depression) - STG: Erica "Erica Gallegos" will reduce frequency of avoidant behaviors by 50% as evidenced by self-report in therapy sessions (Anxiety) - LTG: Erica Erica Gallegos" will score less than 5 on the Generalized Anxiety Disorder 7 Scale (GAD-7) (Anxiety) - STG: Report a decrease in anxiety symptoms as evidenced by an overall reduction in anxiety score by a minimum of 25% on the Generalized Anxiety Disorder Scale (GAD-7) (Anxiety)  ProgressTowards Goals: Progressing  Interventions: CBT, Motivational Interviewing, and Supportive  Summary: Erica Gallegos is a 68 y.o. female with past psych history of anxiety, depression, and PTSD, presenting for follow-up therapy session in efforts to improve management of depressive and anxious symptoms. Patient actively engaged in session, presenting in pleasant moods and congruent affect throughout session.  Pt provided updates from previous discussed areas, detailing of husbands willingness to support pt in attending senior community events in order to increase social interaction with individuals other than spouse. Actively reflected on hx of relationship and of pt being the more social partner, proving to detail hx of social events and interactions pt engaged in throughout early adulthood. Processed variances in social interaction/participation throughout life stages, exploring current status of increased isolation following retirement and limitations to pt's abilities to drive self. Explored pt's understanding of  ways in which values are developed and adjust throughout life stages, identifying variances in values between pt and spouse, and things that prove to be important to them both individually. Explored means in which pt could increase structure to daily life by incorporating weekly activity schedule to allocate days/times to certain tasks she enjoys.  Patient responded well to interventions. Patient continues to meet criteria for Depression, Anxiety, and PTSD . Patient will continue to benefit from engagement in outpatient therapy due to being the least restrictive service to meet presenting needs.      02/04/2023   11:16 AM 01/09/2023   12:07 PM 11/26/2022    9:06 AM 11/12/2022    1:19 PM  GAD 7 : Generalized Anxiety Score  Nervous, Anxious, on Edge 1 2 3 1   Control/stop worrying 0 2 1 0  Worry too much - different things 0 1 3 3   Trouble relaxing 0 1 1 2   Restless 0 0 0 0  Easily annoyed or irritable 1 3 3 3   Afraid - awful might happen 0 0 1 0  Total GAD 7 Score 2 9 12 9   Anxiety Difficulty Not difficult at all Somewhat difficult Very difficult Not difficult at all      02/04/2023   11:18 AM 01/09/2023   12:10 PM 11/26/2022    9:52 AM 11/12/2022    1:22 PM 09/27/2022    9:51 AM  Depression screen PHQ 2/9  Decreased Interest 0 2 3 1  0  Down, Depressed, Hopeless 1 2 2  0 0  PHQ - 2 Score 1 4 5 1  0  Altered sleeping 2 3 3 1    Tired, decreased energy 1 3 2 2    Change in appetite 1 0 0 1   Feeling bad or failure about yourself  0 1 3 0   Trouble  concentrating 0 3 3 1    Moving slowly or fidgety/restless 0 0 0 0   Suicidal thoughts 0 0 0 0   PHQ-9 Score 5 14 16 6    Difficult doing work/chores Not difficult at all Somewhat difficult Extremely dIfficult      Suicidal/Homicidal: No  Therapist Response: Clinician utilized CBT, MI, and supportive reflection techniques to address pt presenting sxs and identified challenges. Clinician actively engaged pt in check-in, assessing mood and affect.  ***.   Supported patient in actively listening to pt's recounts of events since previous session, further evoking pt's thoughts and feelings surrounding previously explored stressors. Elicited pt's perspectives on observable improvements in moods since previous session, further processing pt's abilities to disregard negativity from spouse. Engaged pt in reflecting on hx of development and how values are developed and change throughout stages of life. Encouraged pt to explore ways in which she can create routine in daily activities. Therapist provided support and empathy to patient during session.  Clinician reassessed severity of presenting sxs, and presence of any safety concerns. Therapist provided support and empathy to patient during session.  Plan: Return again in 3-4 weeks.  Diagnosis:  Encounter Diagnoses  Name Primary?   Anxiety and depression Yes   PTSD (post-traumatic stress disorder)     Collaboration of Care: Other None necessary at this time.  Patient/Guardian was advised Release of Information must be obtained prior to any record release in order to collaborate their care with an outside provider. Patient/Guardian was advised if they have not already done so to contact the registration department to sign all necessary forms in order for [place] to release information regarding their care.   Consent: Patient/Guardian gives verbal consent for treatment and assignment of benefits for services provided during this visit. Patient/Guardian expressed understanding and agreed to proceed.   Leisa Lenz, MSW, LCSW 02/04/2023,  11:56 AM

## 2023-02-19 DIAGNOSIS — G4733 Obstructive sleep apnea (adult) (pediatric): Secondary | ICD-10-CM | POA: Diagnosis not present

## 2023-02-19 DIAGNOSIS — M48062 Spinal stenosis, lumbar region with neurogenic claudication: Secondary | ICD-10-CM | POA: Diagnosis not present

## 2023-02-26 ENCOUNTER — Other Ambulatory Visit: Payer: Self-pay | Admitting: Internal Medicine

## 2023-02-28 ENCOUNTER — Ambulatory Visit (HOSPITAL_COMMUNITY): Payer: Medicare Other | Admitting: Licensed Clinical Social Worker

## 2023-02-28 DIAGNOSIS — F32A Depression, unspecified: Secondary | ICD-10-CM

## 2023-02-28 DIAGNOSIS — F419 Anxiety disorder, unspecified: Secondary | ICD-10-CM | POA: Diagnosis not present

## 2023-02-28 DIAGNOSIS — F4310 Post-traumatic stress disorder, unspecified: Secondary | ICD-10-CM | POA: Diagnosis not present

## 2023-02-28 NOTE — Progress Notes (Signed)
 THERAPIST PROGRESS NOTE   Session Date: 02/28/2023  Session Time: 1105 - 1210  Participation Level: Active  Behavioral Response: Neat and Well GroomedAlertAnxious, Depressed, Hopeless, and tearful  Type of Therapy: Individual Therapy  Treatment Goals addressed:  - LTG: Reduce frequency, intensity, and duration of depression symptoms so that daily functioning is improved (OP Depression) - LTG: Increase coping skills to manage depression and improve ability to perform daily activities (OP Depression) - STG: Erica "Erica Gallegos" will reduce frequency of avoidant behaviors by 50% as evidenced by self-report in therapy sessions (Anxiety) - LTG: Erica Gallegos" will score less than 5 on the Generalized Anxiety Disorder 7 Scale (GAD-7) (Anxiety) - STG: Report a decrease in anxiety symptoms as evidenced by an overall reduction in anxiety score by a minimum of 25% on the Generalized Anxiety Disorder Scale (GAD-7) (Anxiety)  ProgressTowards Goals: Progressing  Interventions: CBT, Motivational Interviewing, and Supportive  Summary: Erica Gallegos is a 68 y.o. female with past psych history of anxiety, depression, and PTSD, presenting for follow-up therapy session in efforts to improve management of depressive and anxious symptoms.   Patient actively engaged in session, presenting in depressed and anxious moods and congruent affect, with periods of hopelessness and tearfulness throughout session.  - Openly engaged in reassessment of depressive and anxious sxs via PHQ-9 and GAD-7, processing minor increase in anx sxs, moderate inc in depressive sxs, and pts perceived greater increase in sxs, based on challenges occurring primarily within the past week since daughter left from visiting. - Further discussed noticeable trends occurring when daughter visits, and later leaves, resulting in just pt and husband around each other, proving to increase anxious and depressive sxs, triggered by disagreements, husbands lack  of interest in engaging in activities, and harsh nature towards pt. - Pt further reports husband recently suggested divorce due to constant challenges and distress w/in relationship. - Revisited efforts to increase social interaction outside of just spousal interaction for both pt and husband in efforts to increase time apart, allow space from each other, and find alternate interest, further processing benefits of social interactions with peers.   Patient responded well to interventions. Patient continues to meet criteria for Depression, Anxiety, and PTSD . Patient will continue to benefit from engagement in outpatient therapy due to being the least restrictive service to meet presenting needs.      02/28/2023   11:18 AM 02/04/2023   11:16 AM 01/09/2023   12:07 PM 11/26/2022    9:06 AM  GAD 7 : Generalized Anxiety Score  Nervous, Anxious, on Edge 1 1 2 3   Control/stop worrying 1 0 2 1  Worry too much - different things 0 0 1 3  Trouble relaxing 0 0 1 1  Restless 0 0 0 0  Easily annoyed or irritable 1 1 3 3   Afraid - awful might happen 0 0 0 1  Total GAD 7 Score 3 2 9 12   Anxiety Difficulty Somewhat difficult Not difficult at all Somewhat difficult Very difficult      02/28/2023   11:27 AM 02/04/2023   11:18 AM 01/09/2023   12:10 PM 11/26/2022    9:52 AM 11/12/2022    1:22 PM  Depression screen PHQ 2/9  Decreased Interest 1 0 2 3 1   Down, Depressed, Hopeless 2 1 2 2  0  PHQ - 2 Score 3 1 4 5 1   Altered sleeping 1 2 3 3 1   Tired, decreased energy 1 1 3 2 2   Change in appetite 1 1 0 0  1  Feeling bad or failure about yourself  1 0 1 3 0  Trouble concentrating 1 0 3 3 1   Moving slowly or fidgety/restless 1 0 0 0 0  Suicidal thoughts 1 0 0 0 0  PHQ-9 Score 10 5 14 16 6   Difficult doing work/chores Somewhat difficult Not difficult at all Somewhat difficult Extremely dIfficult     Suicidal/Homicidal: No  Therapist Response: Clinician utilized CBT, MI, and supportive reflection techniques  to address pt presenting sxs and identified challenges. Clinician actively engaged pt in check-in, assessing mood and affect.  - Clinician reassessed presenting depressive and anxious sxs via PHQ-9 and GAD-7, engaging pt in reflection on variances in scores over past two months, processing reported stressors and sxs, increased scores, and contributing factors. - Actively listened to pt's recounts of the past three weeks, supporting pt in reflection and processing of events, time spent with daughter, and challenges encountered with spouse in recent interactions. - Supported pt in challenging thoughts and exploring perspectives and approaches to challenging interactions in efforts to improve perspectives. - Revisited discussions surrounding exploring increasing social interactions in efforts to improve socialization and engagement in community.  Clinician reassessed severity of presenting sxs, and presence of any safety concerns. Therapist provided support and empathy to patient during session.  Plan: Return again in 3-4 weeks.  Diagnosis:  Encounter Diagnoses  Name Primary?   Anxiety and depression Yes   PTSD (post-traumatic stress disorder)      Collaboration of Care: Other None necessary at this time.  Patient/Guardian was advised Release of Information must be obtained prior to any record release in order to collaborate their care with an outside provider. Patient/Guardian was advised if they have not already done so to contact the registration department to sign all necessary forms in order for [place] to release information regarding their care.   Consent: Patient/Guardian gives verbal consent for treatment and assignment of benefits for services provided during this visit. Patient/Guardian expressed understanding and agreed to proceed.   Leisa Lenz, MSW, LCSW 02/28/2023,  11:34 AM

## 2023-03-10 ENCOUNTER — Other Ambulatory Visit: Payer: Self-pay | Admitting: Internal Medicine

## 2023-03-10 DIAGNOSIS — K219 Gastro-esophageal reflux disease without esophagitis: Secondary | ICD-10-CM

## 2023-03-17 ENCOUNTER — Other Ambulatory Visit: Payer: Self-pay | Admitting: Internal Medicine

## 2023-03-19 DIAGNOSIS — G4733 Obstructive sleep apnea (adult) (pediatric): Secondary | ICD-10-CM | POA: Diagnosis not present

## 2023-03-21 ENCOUNTER — Ambulatory Visit (HOSPITAL_COMMUNITY): Payer: Medicare Other | Admitting: Licensed Clinical Social Worker

## 2023-03-26 ENCOUNTER — Telehealth (HOSPITAL_COMMUNITY): Payer: Self-pay

## 2023-03-26 DIAGNOSIS — F4310 Post-traumatic stress disorder, unspecified: Secondary | ICD-10-CM

## 2023-03-26 DIAGNOSIS — F419 Anxiety disorder, unspecified: Secondary | ICD-10-CM

## 2023-03-26 MED ORDER — CLONAZEPAM 0.5 MG PO TABS: 0.5000 mg | ORAL_TABLET | Freq: Every evening | ORAL | 1 refills | Status: DC | PRN

## 2023-03-26 NOTE — Telephone Encounter (Signed)
 Prescription sent to [facility].  She can pick up the medicine on her due date.

## 2023-03-26 NOTE — Telephone Encounter (Signed)
 Medication refill request - Fax from patient's [facility] for a new Clonazepam 0.5 mg order, one at bedtime as needed, last prescribed when seen 01/31/23 + 1 refill and last filled on 03/07/23. Patient does not return for next appointment until 04/23/93.

## 2023-04-05 ENCOUNTER — Other Ambulatory Visit: Payer: Self-pay | Admitting: Internal Medicine

## 2023-04-07 ENCOUNTER — Other Ambulatory Visit: Payer: Self-pay | Admitting: Internal Medicine

## 2023-04-10 ENCOUNTER — Ambulatory Visit (HOSPITAL_COMMUNITY): Payer: Medicare Other | Admitting: Licensed Clinical Social Worker

## 2023-04-10 DIAGNOSIS — F32A Depression, unspecified: Secondary | ICD-10-CM | POA: Diagnosis not present

## 2023-04-10 DIAGNOSIS — F419 Anxiety disorder, unspecified: Secondary | ICD-10-CM | POA: Diagnosis not present

## 2023-04-10 DIAGNOSIS — F4310 Post-traumatic stress disorder, unspecified: Secondary | ICD-10-CM

## 2023-04-10 DIAGNOSIS — R7989 Other specified abnormal findings of blood chemistry: Secondary | ICD-10-CM | POA: Diagnosis not present

## 2023-04-10 NOTE — Progress Notes (Signed)
 THERAPIST PROGRESS NOTE   Session Date: 04/10/2023  Session Time: 1116 - 1207  Participation Level: Active  Behavioral Response: CasualAlertDepressed  Type of Therapy: Individual Therapy  Treatment Goals addressed:  - LTG: Reduce frequency, intensity, and duration of depression symptoms so that daily functioning is improved (OP Depression) - LTG: Increase coping skills to manage depression and improve ability to perform daily activities (OP Depression) - STG: Erica Gallegos "Erica Gallegos" will reduce frequency of avoidant behaviors by 50% as evidenced by self-report in therapy sessions (Anxiety) - LTG: Erica Gallegos" will score less than 5 on the Generalized Anxiety Disorder 7 Scale (GAD-7) (Anxiety) - STG: Report a decrease in anxiety symptoms as evidenced by an overall reduction in anxiety score by a minimum of 25% on the Generalized Anxiety Disorder Scale (GAD-7) (Anxiety)  ProgressTowards Goals: Not Progressing  Interventions: CBT, Motivational Interviewing, and Supportive  Summary: Erica Gallegos is a 68 y.o. female with past psych history of anxiety, depression, and PTSD, presenting for follow-up therapy session in efforts to improve management of depressive and anxious symptoms.   Patient openly engaged in session, presenting in depressed moods and congruent affect, with periods of brief tearfulness throughout session.  Patient openly engaged in providing detailed recounts of events throughout the past six weeks since previous visit, expressing increasing concerns as of late surrounding memory and balance.  Patient further shared concerns surrounding recent experiences feeling the presence of her relatives that have passed, including 3 brothers, a sister and her children, and her father, engaging further in extensive exploration of circumstances surrounding her siblings passings, proving available to clearly reflect on time for things and circumstances of the events.  Further explored thoughts surrounding  increased frequency of challenges with recall, processing of whether factors have been explored with med man provider or alternate providers involved in patient care.  Patient detailed having expressed concerns to PCP, with provider having confirmed plans for referrals for possible cognitive and/or neurological testing prior to PCP leaving practice.  Actively processed avenues in which patient can explore possibilities of securing referral and/or scheduling necessary screening through PCP or neurologist.  Briefly revisited previously explored efforts to increase social interaction, sharing of no progressions.  Patient responded well to interventions. Patient continues to meet criteria for Depression, Anxiety, and PTSD . Patient will continue to benefit from engagement in outpatient therapy due to being the least restrictive service to meet presenting needs.      02/28/2023   11:18 AM 02/04/2023   11:16 AM 01/09/2023   12:07 PM 11/26/2022    9:06 AM  GAD 7 : Generalized Anxiety Score  Nervous, Anxious, on Edge 1 1 2 3   Control/stop worrying 1 0 2 1  Worry too much - different things 0 0 1 3  Trouble relaxing 0 0 1 1  Restless 0 0 0 0  Easily annoyed or irritable 1 1 3 3   Afraid - awful might happen 0 0 0 1  Total GAD 7 Score 3 2 9 12   Anxiety Difficulty Somewhat difficult Not difficult at all Somewhat difficult Very difficult      02/28/2023   11:27 AM 02/04/2023   11:18 AM 01/09/2023   12:10 PM 11/26/2022    9:52 AM 11/12/2022    1:22 PM  Depression screen PHQ 2/9  Decreased Interest 1 0 2 3 1   Down, Depressed, Hopeless 2 1 2 2  0  PHQ - 2 Score 3 1 4 5 1   Altered sleeping 1 2 3 3 1   Tired, decreased  energy 1 1 3 2 2   Change in appetite 1 1 0 0 1  Feeling bad or failure about yourself  1 0 1 3 0  Trouble concentrating 1 0 3 3 1   Moving slowly or fidgety/restless 1 0 0 0 0  Suicidal thoughts 1 0 0 0 0  PHQ-9 Score 10 5 14 16 6   Difficult doing work/chores Somewhat difficult Not difficult  at all Somewhat difficult Extremely dIfficult     Suicidal/Homicidal: No  Therapist Response: Clinician utilized CBT, MI, and supportive reflection techniques to address pt presenting sxs and identified challenges.   Clinician openly greeted patient, actively engaged pt in check-in, assessing presenting moods and affect, prompting patient's brief detailing of presenting moods and contributing factors.  Utilized open-ended questions and efforts to further prompt patient's recounts of recent events, actively listening to patient's reflections of events and reports of increased challenges surrounding memory loss, proving to increase depressive symptoms.  Further engaged patient via Socratic questioning and efforts to elicit greater processing of thoughts and feelings in relation to presenting stressors.  Provided patient with support and empathy throughout reflection of presenting challenges and history of loss.  Utilized MI and supportive reflection interventions and further processing patient's willingness to pursue greater support surrounding presenting medical concerns, provided patient with encouragement surrounding prior discussions related to social interactions outside of the home engaging with husband.  Clinician reassessed severity of presenting sxs, and presence of any safety concerns. Therapist provided support and empathy to patient during session.  Plan: Return again in 3-4 weeks.  Diagnosis:  Encounter Diagnoses  Name Primary?   Anxiety and depression Yes   PTSD (post-traumatic stress disorder)       Collaboration of Care: Other None necessary at this time.  Patient/Guardian was advised Release of Information must be obtained prior to any record release in order to collaborate their care with an outside provider. Patient/Guardian was advised if they have not already done so to contact the registration department to sign all necessary forms in order for [place] to release information  regarding their care.   Consent: Patient/Guardian gives verbal consent for treatment and assignment of benefits for services provided during this visit. Patient/Guardian expressed understanding and agreed to proceed.   Leisa Lenz, MSW, LCSW 04/10/2023,  10:10 PM

## 2023-04-11 ENCOUNTER — Ambulatory Visit (HOSPITAL_COMMUNITY): Payer: Medicare Other | Admitting: Licensed Clinical Social Worker

## 2023-04-11 LAB — COMPREHENSIVE METABOLIC PANEL WITH GFR
ALT: 30 IU/L (ref 0–32)
AST: 25 IU/L (ref 0–40)
Albumin: 4.5 g/dL (ref 3.9–4.9)
Alkaline Phosphatase: 87 IU/L (ref 44–121)
BUN/Creatinine Ratio: 14 (ref 12–28)
BUN: 21 mg/dL (ref 8–27)
Bilirubin Total: 0.7 mg/dL (ref 0.0–1.2)
CO2: 23 mmol/L (ref 20–29)
Calcium: 10.6 mg/dL — ABNORMAL HIGH (ref 8.7–10.3)
Chloride: 101 mmol/L (ref 96–106)
Creatinine, Ser: 1.53 mg/dL — ABNORMAL HIGH (ref 0.57–1.00)
Globulin, Total: 2.2 g/dL (ref 1.5–4.5)
Glucose: 80 mg/dL (ref 70–99)
Potassium: 5.4 mmol/L — ABNORMAL HIGH (ref 3.5–5.2)
Sodium: 136 mmol/L (ref 134–144)
Total Protein: 6.7 g/dL (ref 6.0–8.5)
eGFR: 38 mL/min/{1.73_m2} — ABNORMAL LOW (ref >59)

## 2023-04-11 LAB — PTH, INTACT AND CALCIUM: PTH: 62 pg/mL (ref 15–65)

## 2023-04-11 LAB — MAGNESIUM: Magnesium: 1.9 mg/dL (ref 1.6–2.3)

## 2023-04-11 LAB — TSH: TSH: 2.44 u[IU]/mL (ref 0.450–4.500)

## 2023-04-11 LAB — T4, FREE: Free T4: 1.2 ng/dL (ref 0.82–1.77)

## 2023-04-11 LAB — VITAMIN D 25 HYDROXY (VIT D DEFICIENCY, FRACTURES): Vit D, 25-Hydroxy: 44.7 ng/mL (ref 30.0–100.0)

## 2023-04-11 LAB — PHOSPHORUS: Phosphorus: 3 mg/dL (ref 3.0–4.3)

## 2023-04-18 ENCOUNTER — Ambulatory Visit: Payer: Medicare Other | Admitting: "Endocrinology

## 2023-04-19 DIAGNOSIS — G4733 Obstructive sleep apnea (adult) (pediatric): Secondary | ICD-10-CM | POA: Diagnosis not present

## 2023-04-24 ENCOUNTER — Other Ambulatory Visit: Payer: Self-pay

## 2023-04-24 ENCOUNTER — Ambulatory Visit (HOSPITAL_COMMUNITY): Payer: Medicare Other | Admitting: Psychiatry

## 2023-04-24 ENCOUNTER — Encounter (HOSPITAL_COMMUNITY): Payer: Self-pay | Admitting: Psychiatry

## 2023-04-24 VITALS — BP 135/87 | HR 61 | Ht 66.0 in | Wt 157.0 lb

## 2023-04-24 DIAGNOSIS — F4310 Post-traumatic stress disorder, unspecified: Secondary | ICD-10-CM | POA: Diagnosis not present

## 2023-04-24 DIAGNOSIS — F419 Anxiety disorder, unspecified: Secondary | ICD-10-CM | POA: Diagnosis not present

## 2023-04-24 DIAGNOSIS — F330 Major depressive disorder, recurrent, mild: Secondary | ICD-10-CM | POA: Diagnosis not present

## 2023-04-24 MED ORDER — QUETIAPINE FUMARATE 50 MG PO TABS: 50.0000 mg | ORAL_TABLET | Freq: Two times a day (BID) | ORAL | 1 refills | Status: DC

## 2023-04-24 MED ORDER — CLONAZEPAM 0.5 MG PO TABS: 0.5000 mg | ORAL_TABLET | Freq: Every evening | ORAL | 1 refills | Status: DC | PRN

## 2023-04-24 MED ORDER — BUPROPION HCL ER (XL) 300 MG PO TB24: 300.0000 mg | ORAL_TABLET | ORAL | 1 refills | Status: DC
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
## 2023-04-24 NOTE — Addendum Note (Signed)
 Addended by: Almin Livingstone E on: 04/24/2023 11:33 AM   Modules accepted: Orders

## 2023-04-26 LAB — PAIN MGT SCRN (14 DRUGS), UR
Amphetamine Scrn, Ur: NEGATIVE ng/mL
BARBITURATE SCREEN URINE: NEGATIVE ng/mL
BENZODIAZEPINE SCREEN, URINE: NEGATIVE ng/mL
Buprenorphine, Urine: NEGATIVE ng/mL
CANNABINOIDS UR QL SCN: POSITIVE ng/mL — AB
Cocaine (Metab) Scrn, Ur: NEGATIVE ng/mL
Creatinine(Crt), U: 72.8 mg/dL (ref 20.0–300.0)
Fentanyl, Urine: NEGATIVE pg/mL
Meperidine Screen, Urine: NEGATIVE ng/mL
Methadone Screen, Urine: NEGATIVE ng/mL
OXYCODONE+OXYMORPHONE UR QL SCN: POSITIVE ng/mL — AB
Opiate Scrn, Ur: POSITIVE ng/mL — AB
Ph of Urine: 5.5 (ref 4.5–8.9)
Phencyclidine Qn, Ur: NEGATIVE ng/mL
Propoxyphene Scrn, Ur: NEGATIVE ng/mL
Tramadol Screen, Urine: NEGATIVE ng/mL

## 2023-04-28 ENCOUNTER — Telehealth (HOSPITAL_COMMUNITY): Payer: Self-pay | Admitting: *Deleted

## 2023-04-28 ENCOUNTER — Other Ambulatory Visit (HOSPITAL_COMMUNITY): Payer: Self-pay | Admitting: *Deleted

## 2023-04-28 NOTE — Telephone Encounter (Signed)
 I called patient and inform about her U tox positive for cannabinoids.  She admitted smoking cannabis and delta 8 agent.  I also informed that we will not dispense Klonopin  and we are going to call the pharmacy to cancel the future refills of Klonopin .  We will repeat the labs in 3 months and we make the decision upon the results.  Please call pharmacy to cancel her Klonopin  refills.  Patient understand and accept the decision.  Aaron Aas

## 2023-04-28 NOTE — Telephone Encounter (Signed)
 Done

## 2023-04-28 NOTE — Telephone Encounter (Signed)
 Received results from U-Tox preformed by [facility] on 04/24/23 and resulted on 04/26/23. Results are in EPIC, please review.

## 2023-05-01 ENCOUNTER — Telehealth (HOSPITAL_COMMUNITY): Payer: Self-pay | Admitting: *Deleted

## 2023-05-01 NOTE — Telephone Encounter (Signed)
 PA for Seroquel  50 mg submitted to [facility]. PA has been Approved from 04/30/23 through 04/30/24. Pt advised.

## 2023-05-06 ENCOUNTER — Other Ambulatory Visit: Payer: Self-pay | Admitting: Internal Medicine

## 2023-05-07 ENCOUNTER — Encounter (HOSPITAL_COMMUNITY): Payer: Self-pay

## 2023-05-07 ENCOUNTER — Ambulatory Visit (HOSPITAL_COMMUNITY): Admitting: Licensed Clinical Social Worker

## 2023-05-07 DIAGNOSIS — F330 Major depressive disorder, recurrent, mild: Secondary | ICD-10-CM

## 2023-05-07 DIAGNOSIS — F4310 Post-traumatic stress disorder, unspecified: Secondary | ICD-10-CM | POA: Diagnosis not present

## 2023-05-07 NOTE — Progress Notes (Signed)
 THERAPIST PROGRESS NOTE   Session Date: 05/07/2023  Session Time: 1315 - 1406  Participation Level: Active  Behavioral Response: CasualAlertEuthymic  Type of Therapy: Individual Therapy  Treatment Goals addressed:  - LTG: Reduce frequency, intensity, and duration of depression symptoms so that daily functioning is improved (OP Depression) - LTG: Increase coping skills to manage depression and improve ability to perform daily activities (OP Depression) - STG: Isra "Thersia Flax" will reduce frequency of avoidant behaviors by 50% as evidenced by self-report in therapy sessions (Anxiety) - LTG: Raeanne BullThersia Flax" will score less than 5 on the Generalized Anxiety Disorder 7 Scale (GAD-7) (Anxiety) (MET) - STG: Report a decrease in anxiety symptoms as evidenced by an overall reduction in anxiety score by a minimum of 25% on the Generalized Anxiety Disorder Scale (GAD-7) (Anxiety) (MET)  ProgressTowards Goals: Not Progressing  Interventions: CBT, Motivational Interviewing, and Supportive  Summary: Thersia Flax is a 68 y.o. female with past psych history of anxiety, depression, and PTSD, presenting for follow-up therapy session in efforts to improve management of depressive and anxious symptoms.   Pt actively engaged in session, presenting in variable moods and congruent affect, primarily pleasant with intermittent periods of being down, irritable, and tearfulness when reflecting on recent events and conflicts with spouse. Pt further engaged in reflection of events, sharing of "Things are doing better", believe has to do with spouse leaving pt at home to focus on tasks such as getting items for projects husband may be working on.  Patient shared of finding memory is continuing to be of concern, and husband has been trying to test pt's memory throughout the days by giving things to remember and testing throughout the day, which patient expressed to be found frustrating. Planning on visiting concerns with PCP  for possible referral to explore concerns related to dementia.  Patient further engaged in continued exploration of ongoing stress surrounding relationship with husband, engaging in processing of contributing factors, specifically noting of continued increased time around each other and finding cells picking at small irritations.  Processed patient's understanding of choosing to love each other and finding the good within the relationship and factors related to negative thought patterns supporting depressed moods.  Further processed efforts at increasing social interaction outside of the couple, expressing continued intent to attend local [employer] to connect with other individuals and participate in community social programs.  Actively engaged in 6 months review of individualized treatment goals outlined in treatment plan, noting of having met two goals applicable to the management of anxious symptoms, however not proving to progress consistently towards depression related goals and electing to maintain depression specific goals for future course of treatment.  Patient expressed having reviewed treatment notes from prior sessions, noting of having not been truthful when periodically reviewing SI, endorsing occasional passive SI, sharing of thoughts, however denying plan, intent, or means.  Patient responded well to interventions. Patient continues to meet criteria for Depression, Anxiety, and PTSD . Patient will continue to benefit from engagement in outpatient therapy due to being the least restrictive service to meet presenting needs.      02/28/2023   11:18 AM 02/04/2023   11:16 AM 01/09/2023   12:07 PM 11/26/2022    9:06 AM  GAD 7 : Generalized Anxiety Score  Nervous, Anxious, on Edge 1 1 2 3   Control/stop worrying 1 0 2 1  Worry too much - different things 0 0 1 3  Trouble relaxing 0 0 1 1  Restless 0 0 0 0  Easily  annoyed or irritable 1 1 3 3   Afraid - awful might happen 0 0 0 1  Total GAD 7 Score 3  2 9 12   Anxiety Difficulty Somewhat difficult Not difficult at all Somewhat difficult Very difficult      02/28/2023   11:27 AM 02/04/2023   11:18 AM 01/09/2023   12:10 PM 11/26/2022    9:52 AM 11/12/2022    1:22 PM  Depression screen PHQ 2/9  Decreased Interest 1 0 2 3 1   Down, Depressed, Hopeless 2 1 2 2  0  PHQ - 2 Score 3 1 4 5 1   Altered sleeping 1 2 3 3 1   Tired, decreased energy 1 1 3 2 2   Change in appetite 1 1 0 0 1  Feeling bad or failure about yourself  1 0 1 3 0  Trouble concentrating 1 0 3 3 1   Moving slowly or fidgety/restless 1 0 0 0 0  Suicidal thoughts 1 0 0 0 0  PHQ-9 Score 10 5 14 16 6   Difficult doing work/chores Somewhat difficult Not difficult at all Somewhat difficult Extremely dIfficult     Suicidal/Homicidal: Yes; pSI, no plans or intent.  Therapist Response: Clinician utilized CBT, MI, and supportive reflection techniques to address pt presenting sxs and identified challenges.   Clinician greeted patient upon joining today's session, actively engaging in introductory check-in, assessing presenting moods and affect, and further prompting patient's reflection of recent events and factors contributing to presenting moods.  Actively listening to patient's reflections of recent events, utilizing open-ended questions to further prompt patient's processing of thoughts and feelings in relation to presenting stressors and challenges.  Utilized CBT and MI to support patient in processing understanding surrounding negative perspectives and implications on thought patterns, feelings, behavioral responses, and overall depressed moods.  Further supported patient and encouraging increased frequency of social connections outside of marriage to alleviate stressors within relationship.  Engage patient in 85-month review of treatment goals outlined and individualized plan, reflecting on progressions and/or lack thereof, and continued areas for work.  Clinician reassessed severity of  presenting sxs, and presence of any safety concerns. Therapist provided support and empathy to patient during session.  Plan: Return again in 3-4 weeks.  Diagnosis:  Encounter Diagnoses  Name Primary?   MDD (major depressive disorder), recurrent episode, mild (HCC) Yes   PTSD (post-traumatic stress disorder)        Collaboration of Care: Other None necessary at this time.  Patient/Guardian was advised Release of Information must be obtained prior to any record release in order to collaborate their care with an outside provider. Patient/Guardian was advised if they have not already done so to contact the registration department to sign all necessary forms in order for us  to release information regarding their care.   Consent: Patient/Guardian gives verbal consent for treatment and assignment of benefits for services provided during this visit. Patient/Guardian expressed understanding and agreed to proceed.   Patsi Boots, MSW, LCSW 05/07/2023,  1:50 PM

## 2023-05-15 ENCOUNTER — Other Ambulatory Visit: Payer: Self-pay

## 2023-05-15 ENCOUNTER — Ambulatory Visit (INDEPENDENT_AMBULATORY_CARE_PROVIDER_SITE_OTHER): Admitting: Internal Medicine

## 2023-05-15 ENCOUNTER — Emergency Department (HOSPITAL_COMMUNITY)
Admission: EM | Admit: 2023-05-15 | Discharge: 2023-05-15 | Disposition: A | Discharge status: Home / Self Care | Attending: Emergency Medicine | Admitting: Emergency Medicine

## 2023-05-15 ENCOUNTER — Emergency Department (HOSPITAL_COMMUNITY)

## 2023-05-15 ENCOUNTER — Encounter (HOSPITAL_COMMUNITY): Payer: Self-pay

## 2023-05-15 ENCOUNTER — Encounter: Payer: Self-pay | Admitting: Internal Medicine

## 2023-05-15 VITALS — BP 122/68 | HR 65 | Ht 66.0 in | Wt 155.2 lb

## 2023-05-15 DIAGNOSIS — F32A Depression, unspecified: Secondary | ICD-10-CM

## 2023-05-15 DIAGNOSIS — M79601 Pain in right arm: Secondary | ICD-10-CM | POA: Diagnosis not present

## 2023-05-15 DIAGNOSIS — N1832 Chronic kidney disease, stage 3b: Secondary | ICD-10-CM | POA: Diagnosis not present

## 2023-05-15 DIAGNOSIS — E782 Mixed hyperlipidemia: Secondary | ICD-10-CM | POA: Diagnosis not present

## 2023-05-15 DIAGNOSIS — F419 Anxiety disorder, unspecified: Secondary | ICD-10-CM

## 2023-05-15 DIAGNOSIS — M810 Age-related osteoporosis without current pathological fracture: Secondary | ICD-10-CM | POA: Diagnosis not present

## 2023-05-15 DIAGNOSIS — M4800 Spinal stenosis, site unspecified: Secondary | ICD-10-CM

## 2023-05-15 DIAGNOSIS — I10 Essential (primary) hypertension: Secondary | ICD-10-CM

## 2023-05-15 DIAGNOSIS — M25511 Pain in right shoulder: Secondary | ICD-10-CM | POA: Insufficient documentation

## 2023-05-15 DIAGNOSIS — S4991XA Unspecified injury of right shoulder and upper arm, initial encounter: Secondary | ICD-10-CM | POA: Diagnosis not present

## 2023-05-15 HISTORY — DX: Scoliosis, unspecified: M41.9

## 2023-05-15 MED ORDER — NAPROXEN 500 MG PO TABS: 500.0000 mg | ORAL_TABLET | Freq: Two times a day (BID) | ORAL | 0 refills | Status: AC

## 2023-05-15 NOTE — Assessment & Plan Note (Signed)
 GFR 38 on labs from last month.  She is currently prescribed lisinopril .  Repeat CMP ordered today.

## 2023-05-15 NOTE — Assessment & Plan Note (Signed)
 Remains adequately controlled on current antihypertensive regimen.  No medication changes are indicated today.

## 2023-05-15 NOTE — ED Triage Notes (Signed)
 Pt arrived via POV c/o injury to her upper right arm. Pt reports she lost her balance and fell into her granite countertop. Pt presents with limited mobility to her RUE, and is concerned she may have broke her arm.

## 2023-05-15 NOTE — ED Provider Notes (Signed)
 [place] EMERGENCY DEPARTMENT AT [facility] Provider Note   CSN: [id number] Arrival date & time: 05/15/23  1839     History  Chief Complaint  Patient presents with   Arm Injury    Erica Gallegos is a 68 y.o. female who states earlier this evening she was in the kitchen cooking, and tripped over her walker when trying to move a pan from the oven to the sink.  Patient states she fell directly onto her Granite countertop with her right shoulder and arm.  Patient denies hitting her head.  Patient denies blood thinners.  Patient denies syncope or any preceding event other than mechanical etiology of fall.  Patient states it is painful for her to move her shoulder and arm at all.  Denies pain different from baseline of any other extremity, chest, back, abdomen, hips.  Denies neck pain.   Arm Injury Associated symptoms: no fever and no neck pain       Home Medications Prior to Admission medications   Medication Sig Start Date End Date Taking? Authorizing Provider  acetaminophen  (TYLENOL ) 325 MG tablet Take 650 mg by mouth every 6 (six) hours as needed. Patient not taking: Reported on 04/24/2023    [provider]  atorvastatin  (LIPITOR) 40 MG tablet Take 1 tablet (40 mg total) by mouth daily. 12/26/22   Tobi Fortes, MD  buPROPion  (WELLBUTRIN  XL) 300 MG 24 hr tablet Take 1 tablet (300 mg total) by mouth every morning. 04/24/23 06/23/23  Arfeen, Bronson Canny, MD  carvedilol  (COREG ) 12.5 MG tablet TAKE 1 TABLET(12.5 MG) BY MOUTH TWICE DAILY 12/26/22   Tobi Fortes, MD  chlorthalidone  (HYGROTON ) 25 MG tablet TAKE 1 AND 1/2 TABLETS(37.5 MG) BY MOUTH DAILY 12/26/22   Tobi Fortes, MD  Cholecalciferol  (VITAMIN D3) 50 MCG (2000 UT) TABS Take 2,000 Units by mouth daily.    [provider]  cyclobenzaprine  (FLEXERIL ) 10 MG tablet TAKE 1 TABLET BY MOUTH EVERY AT BEDTIME AS NEEDED FOR BACK OR NERVE PAIN 05/06/23   Tobi Fortes, MD  DULoxetine  (CYMBALTA ) 60 MG  capsule TAKE 1 CAPSULE(60 MG) BY MOUTH TWICE DAILY 03/17/23   Tobi Fortes, MD  esomeprazole  (NEXIUM ) 20 MG capsule TAKE 1 CAPSULE BY MOUTH EVERY DAY AT NOON 03/11/23   Tobi Fortes, MD  ezetimibe  (ZETIA ) 10 MG tablet Take 1 tablet (10 mg total) by mouth daily. 12/26/22   Tobi Fortes, MD  gabapentin  (NEURONTIN ) 300 MG capsule TAKE 1 CAPSULE BY MOUTH EVERY MORNING AND EVERY EVENING THEN TAKE 2 CAPSULES BY MOUTH AT BEDTIME 03/11/23   Tobi Fortes, MD  hydrALAZINE  (APRESOLINE ) 100 MG tablet Take 1 tablet (100 mg total) by mouth 2 (two) times daily. 12/26/22   Tobi Fortes, MD  hydrOXYzine  (VISTARIL ) 25 MG capsule Take 1 capsule (25 mg total) by mouth every 8 (eight) hours as needed for itching. 01/23/23   Tobi Fortes, MD  lisinopril  (ZESTRIL ) 40 MG tablet Take 1 tablet (40 mg total) by mouth daily. 12/26/22   Tobi Fortes, MD  Melatonin 10 MG TABS Take 10 mg by mouth at bedtime.    [provider]  Omega-3 Fatty Acids (FISH OIL) 1000 MG CAPS Take 4,000 mg by mouth daily. 11/24/19   [provider]  oxyCODONE-acetaminophen  (PERCOCET/ROXICET) 5-325 MG tablet Take 1 tablet by mouth every 6 (six) hours as needed. 09/23/22   [provider]  QUEtiapine  (SEROQUEL ) 50 MG tablet Take 1-2 tablets (50-100  mg total) by mouth 2 (two) times daily. 04/24/23 05/24/23  Arfeen, Bronson Canny, MD  spironolactone  (ALDACTONE ) 25 MG tablet TAKE 1 TABLET(25 MG) BY MOUTH DAILY 12/26/22   Dixon, Phillip E, MD  vitamin B-12 (CYANOCOBALAMIN ) 500 MCG tablet Take 500 mcg by mouth daily.    [provider]  vitamin C (ASCORBIC ACID) 500 MG tablet Take 500 mg by mouth daily.    [provider]      Allergies    Atenolol and Amlodipine    Review of Systems   Review of Systems  Constitutional:  Negative for chills and fever.  Respiratory:  Negative for cough and shortness of breath.   Cardiovascular:  Negative for chest pain.  Gastrointestinal:  Negative for abdominal  pain and vomiting.  Musculoskeletal:  Positive for arthralgias and myalgias. Negative for neck pain and neck stiffness.  Neurological:  Negative for seizures and syncope.  All other systems reviewed and are negative.   Physical Exam Updated Vital Signs BP 138/89 (BP Location: Left Arm)   Pulse 66   Temp 97.9 F (36.6 C) (Oral)   Resp 18   Ht 5\' 6"  (1.676 m)   Wt 70.3 kg   SpO2 99%   BMI 25.01 kg/m  Physical Exam Vitals and nursing note reviewed.  Constitutional:      General: She is awake. She is not in acute distress.    Appearance: Normal appearance. She is not ill-appearing, toxic-appearing or diaphoretic.  HENT:     Head: Normocephalic and atraumatic.  Eyes:     Extraocular Movements: Extraocular movements intact.  Cardiovascular:     Rate and Rhythm: Normal rate and regular rhythm.     Pulses: Normal pulses.  Pulmonary:     Effort: Pulmonary effort is normal. No respiratory distress.     Breath sounds: Normal breath sounds. No wheezing, rhonchi or rales.  Abdominal:     General: Abdomen is flat.     Palpations: Abdomen is soft.  Musculoskeletal:        General: Swelling (R arm and shoulder) and tenderness (R arm and shoulder) present.     Right upper arm: Swelling, tenderness and bony tenderness present.     Left upper arm: Normal.     Right elbow: Normal.     Left elbow: Normal.     Right forearm: Normal.     Left forearm: Normal.     Cervical back: Normal range of motion. No tenderness.  Skin:    General: Skin is warm and dry.     Capillary Refill: Capillary refill takes less than 2 seconds.     Findings: No bruising, lesion or rash.  Neurological:     General: No focal deficit present.     Mental Status: She is alert and oriented to person, place, and time.     Sensory: No sensory deficit.     Motor: No weakness.  Psychiatric:        Mood and Affect: Mood normal.        Behavior: Behavior normal. Behavior is cooperative.     ED Results / Procedures  / Treatments   Labs (all labs ordered are listed, but only abnormal results are displayed) Labs Reviewed - No data to display  EKG None  Radiology No results found.  Procedures Procedures    Medications Ordered in ED Medications - No data to display  ED Course/ Medical Decision Making/ A&P    Patient presents to the ED for concern of  right arm pain, this involves an extensive number of treatment options, and is a complaint that carries with it a high risk of complications and morbidity.  The differential diagnosis includes fracture, dislocation, soft tissue injury, ligament tendon injury, etc.   Co morbidities that complicate the patient evaluation  Previous right shoulder dislocation.   Imaging Studies ordered:  I ordered imaging studies including right humerus x-ray I independently visualized and interpreted imaging which showed no acute osseous abnormality I agree with the radiologist interpretation   Medicines ordered and prescription drug management:  I ordered medication including naproxen  for outpatient pain and inflammation I have reviewed the patients home medicines and have made adjustments as needed   Critical Interventions:  none   Problem List / ED Course:  Right shoulder, right arm pain Patient presents after mechanical fall where she fell directly on her right shoulder and right humerus against a Granite countertop.  Patient denies hitting head, loss of consciousness, syncope.  Patient denies blood thinners.  Patient denies any pain different than baseline other than her right shoulder or right upper arm. Right humerus x-ray significant for no osseous abnormality Sling ordered as well as anti-inflammatory medication for discharge. Instructed to follow-up with PCP as well as referral given for orthopedics today  Return precautions discussed. Outpatient prescription given for naproxen  for pain and inflammation.   Reevaluation:  After the  interventions noted above, I reevaluated the patient and found that they have :stayed the same   Social Determinants of Health:  none   Dispostion:  After consideration of the diagnostic results and the patients response to treatment, I feel that the patent would benefit from discharge and outpatient follow-up with PCP as well as orthopedics.  Patient prescribed anti-inflammatory medications for outpatient pain management.  Click here for ABCD2, HEART and other calculatorsREFRESH Note before signing :1}                              Medical Decision Making Amount and/or Complexity of Data Reviewed Radiology: ordered.         Final Clinical Impression(s) / ED Diagnoses Final diagnoses:  None    Rx / DC Orders ED Discharge Orders     None         Susanne Epps 05/15/23 2018    Early Glisson, MD 05/21/23 1202

## 2023-05-15 NOTE — Assessment & Plan Note (Signed)
 Followed by psychiatry.  She is currently prescribed Wellbutrin , Cymbalta , and Seroquel .  Clonazepam  discontinued due to positive UDS.

## 2023-05-15 NOTE — Assessment & Plan Note (Addendum)
 She endorses chronic lumbar pain and right leg pain in the setting of spinal stenosis.  Followed by neurosurgery and will receive an epidural steroid injection next week.  She is also prescribed oxycodone-acetaminophen  5-325 mg every 6 hours as needed for pain relief.

## 2023-05-15 NOTE — Assessment & Plan Note (Signed)
 Followed by endocrinology.  T-score -2.7 at the neck of the femur on DEXA from August 2024.  She is on Reclast  infusions.  Endocrinology follow-up is scheduled for 5/16.

## 2023-05-15 NOTE — Discharge Instructions (Addendum)
 It was a pleasure taking care of you today.  Today we evaluated the cause for your right arm pain.  Based on your history physical exam and imaging today, there is no acute fracture noted.  Today you have been given a sling to wear for comfort, as well as a prescription for outpatient anti-inflammatory medication.  Please use this medication as prescribed.  Today you have also been given a referral for orthopedics if your symptoms persist.  Please follow-up with your primary care provider and make an appointment with orthopedics as needed.  Please return to the emergency department or seek further medical care if you experience any following symptoms including but not limited to worsening right arm pain, chest pain, shortness of breath, dizziness, shortness of breath.

## 2023-05-15 NOTE — Progress Notes (Signed)
 Established Patient Office Visit  Subjective   Patient ID: Erica Gallegos, female    DOB: October 03, 1955  Age: 68 y.o. MRN: [id number]  Chief Complaint  Patient presents with   Care Management    Follow up    Erica Gallegos returns to care today for follow-up.  Erica Gallegos was last evaluated by me in December 2024.  Hydroxyzine  was added for as needed itch relief at that time.  No additional medication changes were made and 66-month follow-up was arranged.  In the interim Erica Gallegos has been seen by psychiatry and counseling.  There have otherwise been no acute interval events.  Today Erica Gallegos endorses chronic low back and right leg pain.  Erica Gallegos does not have any acute concerns to discuss.  Past Medical History:  Diagnosis Date   Anxiety    Arthritis    Complication of anesthesia    Patient woke up during anesthesia with bunionectomy   GERD (gastroesophageal reflux disease)    HLD (hyperlipidemia)    HTN (hypertension)    Major depressive disorder    Migraine with aura    PONV (postoperative nausea and vomiting)    RAS (renal artery stenosis) (HCC)    Sleep apnea    Sleep related hypoxia    Spinal stenosis    Past Surgical History:  Procedure Laterality Date   BUNIONECTOMY     CATARACT EXTRACTION     CHOLECYSTECTOMY     COLONOSCOPY WITH PROPOFOL  N/A 12/13/2021   Procedure: COLONOSCOPY WITH PROPOFOL ;  Surgeon: Suzette Espy, MD;  [facility];  Service: Endoscopy;  Laterality: N/A;  8:00am, asa 2   ESOPHAGOGASTRODUODENOSCOPY (EGD) WITH PROPOFOL  N/A 04/05/2021   Procedure: ESOPHAGOGASTRODUODENOSCOPY (EGD) WITH PROPOFOL ;  Surgeon: Suzette Espy, MD;  [facility];  Service: Endoscopy;  Laterality: N/A;  10:15am   ESOPHAGOGASTRODUODENOSCOPY (EGD) WITH PROPOFOL  N/A 12/13/2021   Procedure: ESOPHAGOGASTRODUODENOSCOPY (EGD) WITH PROPOFOL ;  Surgeon: Suzette Espy, MD;  [facility];  Service: Endoscopy;  Laterality: N/A;   MALONEY DILATION N/A 04/05/2021   Procedure:  [name] DILATION;  Surgeon: Suzette Espy, MD;  [facility];  Service: Endoscopy;  Laterality: N/A;   MALONEY DILATION N/A 12/13/2021   Procedure: [name] DILATION;  Surgeon: Suzette Espy, MD;  [facility];  Service: Endoscopy;  Laterality: N/A;   Social History   Tobacco Use   Smoking status: Former    Types: Cigarettes   Smokeless tobacco: Never   Tobacco comments:    Erica Gallegos quit smoking in the early 90's  Vaping Use   Vaping status: Every Day  Substance Use Topics   Alcohol use: Yes    Comment: seldom   Drug use: Yes    Types: Marijuana    Comment: CBD cream; Marijuana a couple times a week. helps with Erica Gallegos anxiety and migraines.   Family History  Problem Relation Age of Onset   Cancer Mother    Hypertension Mother    Diabetes Mother    Heart disease Mother    Hyperlipidemia Mother    Cancer Father    Stroke Father    Hypertension Father    Heart disease Father    Hypertension Sister    Lupus Sister    Hypertension Sister    Colon polyps Sister    Cancer Brother    Brain cancer Brother    Non-Hodgkin's lymphoma Brother    Colon cancer Neg Hx    Allergies  Allergen Reactions  Atenolol Swelling   Amlodipine Swelling   Review of Systems  Constitutional:  Negative for chills and fever.  HENT:  Negative for sore throat.   Respiratory:  Negative for cough and shortness of breath.   Cardiovascular:  Negative for chest pain, palpitations and leg swelling.  Gastrointestinal:  Negative for abdominal pain, blood in stool, constipation, diarrhea, nausea and vomiting.  Genitourinary:  Negative for dysuria and hematuria.  Musculoskeletal:  Positive for back pain (Chronic lumbar pain). Negative for myalgias.  Skin:  Negative for itching and rash.  Neurological:  Positive for tingling (Right leg). Negative for dizziness and headaches.  Psychiatric/Behavioral:  Negative for depression and suicidal ideas.      Objective:     BP 122/68   Pulse  65   Ht 5\' 6"  (1.676 m)   Wt 155 lb 3.2 oz (70.4 kg)   SpO2 92%   BMI 25.05 kg/m  BP Readings from Last 3 Encounters:  05/15/23 122/68  04/24/23 135/87  12/26/22 136/78   Physical Exam Vitals reviewed.  Constitutional:      General: Erica Gallegos is not in acute distress.    Appearance: Normal appearance. Erica Gallegos is not toxic-appearing.  HENT:     Head: Normocephalic and atraumatic.     Right Ear: External ear normal.     Left Ear: External ear normal.     Nose: Nose normal. No congestion or rhinorrhea.     Mouth/Throat:     Mouth: Mucous membranes are moist.     Pharynx: Oropharynx is clear. No oropharyngeal exudate or posterior oropharyngeal erythema.  Eyes:     General: No scleral icterus.    Extraocular Movements: Extraocular movements intact.     Conjunctiva/sclera: Conjunctivae normal.     Pupils: Pupils are equal, round, and reactive to light.  Cardiovascular:     Rate and Rhythm: Normal rate and regular rhythm.     Pulses: Normal pulses.     Heart sounds: Normal heart sounds. No murmur heard.    No friction rub. No gallop.  Pulmonary:     Effort: Pulmonary effort is normal.     Breath sounds: Normal breath sounds. No wheezing, rhonchi or rales.  Abdominal:     General: Abdomen is flat. Bowel sounds are normal. There is no distension.     Palpations: Abdomen is soft.     Tenderness: There is no abdominal tenderness.  Musculoskeletal:        General: No swelling.     Cervical back: Normal range of motion.     Right lower leg: No edema.     Left lower leg: No edema.  Lymphadenopathy:     Cervical: No cervical adenopathy.  Skin:    General: Skin is warm and dry.     Capillary Refill: Capillary refill takes less than 2 seconds.     Coloration: Skin is not jaundiced.  Neurological:     General: No focal deficit present.     Mental Status: Erica Gallegos is alert and oriented to person, place, and time.     Gait: Gait abnormal (Ambulates with a rollator).  Psychiatric:        Mood  and Affect: Mood normal.        Behavior: Behavior normal.   Last CBC Lab Results  Component Value Date   WBC 7.8 02/26/2022   HGB 12.3 02/26/2022   HCT 37.8 02/26/2022   MCV 96 02/26/2022   MCH 31.4 02/26/2022   RDW 12.5 02/26/2022   PLT  265 02/26/2022   Last metabolic panel Lab Results  Component Value Date   GLUCOSE 80 04/10/2023   NA 136 04/10/2023   K 5.4 (H) 04/10/2023   CL 101 04/10/2023   CO2 23 04/10/2023   BUN 21 04/10/2023   CREATININE 1.53 (H) 04/10/2023   EGFR 38 (L) 04/10/2023   CALCIUM  10.6 (H) 04/10/2023   PHOS 3.0 04/10/2023   PROT 6.7 04/10/2023   ALBUMIN 4.5 04/10/2023   LABGLOB 2.2 04/10/2023   AGRATIO 2.0 06/20/2022   BILITOT 0.7 04/10/2023   ALKPHOS 87 04/10/2023   AST 25 04/10/2023   ALT 30 04/10/2023   ANIONGAP 3 (L) 12/04/2021   Last lipids Lab Results  Component Value Date   CHOL 125 02/26/2022   HDL 46 02/26/2022   LDLCALC 53 02/26/2022   TRIG 153 (H) 02/26/2022   CHOLHDL 2.7 02/26/2022   Last hemoglobin A1c Lab Results  Component Value Date   HGBA1C 5.3 02/26/2022   Last thyroid  functions Lab Results  Component Value Date   TSH 2.440 04/10/2023   Last vitamin D  Lab Results  Component Value Date   VD25OH 44.7 04/10/2023   Last vitamin B12 and Folate Lab Results  Component Value Date   VITAMINB12 1,447 (H) 02/26/2022   FOLATE 8.4 02/26/2022     Assessment & Plan:   Problem List Items Addressed This Visit       Hypertension - Primary   Remains adequately controlled on current antihypertensive regimen.  No medication changes are indicated today.      Osteoporosis   Followed by endocrinology.  T-score -2.7 at the neck of the femur on DEXA from August 2024.  Erica Gallegos is on Reclast  infusions.  Endocrinology follow-up is scheduled for 5/16.      Chronic kidney disease, stage 3b (HCC)   GFR 38 on labs from last month.  Erica Gallegos is currently prescribed lisinopril .  Repeat CMP ordered today.      Hyperlipidemia   Erica Gallegos is  currently prescribed atorvastatin  40 mg daily and ezetimibe  10 mg daily.  Lipid panel last updated in February 2024 reflects adequate control on this regimen.  Repeat lipid panel ordered today.      Spinal stenosis   Erica Gallegos endorses chronic lumbar pain and right leg pain in the setting of spinal stenosis.  Followed by neurosurgery and will receive an epidural steroid injection next week.  Erica Gallegos is also prescribed oxycodone-acetaminophen  5-325 mg every 6 hours as needed for pain relief.      Anxiety and depression   Followed by psychiatry.  Erica Gallegos is currently prescribed Wellbutrin , Cymbalta , and Seroquel .  Clonazepam  discontinued due to positive UDS.      Return in about 6 months (around 11/15/2023).   Tobi Fortes, MD

## 2023-05-15 NOTE — Assessment & Plan Note (Signed)
 She is currently prescribed atorvastatin  40 mg daily and ezetimibe  10 mg daily.  Lipid panel last updated in February 2024 reflects adequate control on this regimen.  Repeat lipid panel ordered today.

## 2023-05-15 NOTE — Patient Instructions (Signed)
 It was a pleasure to see you today.  Thank you for giving [place] the opportunity to be involved in your care.  Below is a brief recap of your visit and next steps.  We will plan to see you again in 6 months.  Summary No medication changes today Repeat labs ordered Follow up in 6 months

## 2023-05-16 ENCOUNTER — Encounter: Payer: Self-pay | Admitting: Internal Medicine

## 2023-05-16 LAB — HEMOGLOBIN A1C
Est. average glucose Bld gHb Est-mCnc: 105 mg/dL
Hgb A1c MFr Bld: 5.3 % (ref 4.8–5.6)

## 2023-05-16 LAB — CBC WITH DIFFERENTIAL/PLATELET
Basophils Absolute: 0 10*3/uL (ref 0.0–0.2)
Basos: 1 %
EOS (ABSOLUTE): 0.4 10*3/uL (ref 0.0–0.4)
Eos: 6 %
Hematocrit: 40.5 % (ref 34.0–46.6)
Hemoglobin: 13 g/dL (ref 11.1–15.9)
Immature Grans (Abs): 0 10*3/uL (ref 0.0–0.1)
Immature Granulocytes: 0 %
Lymphocytes Absolute: 1.9 10*3/uL (ref 0.7–3.1)
Lymphs: 29 %
MCH: 30.9 pg (ref 26.6–33.0)
MCHC: 32.1 g/dL (ref 31.5–35.7)
MCV: 96 fL (ref 79–97)
Monocytes Absolute: 0.4 10*3/uL (ref 0.1–0.9)
Monocytes: 6 %
Neutrophils Absolute: 3.8 10*3/uL (ref 1.4–7.0)
Neutrophils: 58 %
Platelets: 223 10*3/uL (ref 150–450)
RBC: 4.21 x10E6/uL (ref 3.77–5.28)
RDW: 13.3 % (ref 11.7–15.4)
WBC: 6.6 10*3/uL (ref 3.4–10.8)

## 2023-05-16 LAB — LIPID PANEL
Chol/HDL Ratio: 3 ratio (ref 0.0–4.4)
Cholesterol, Total: 160 mg/dL (ref 100–199)
HDL: 54 mg/dL (ref >39)
LDL Chol Calc (NIH): 86 mg/dL (ref 0–99)
Triglycerides: 111 mg/dL (ref 0–149)
VLDL Cholesterol Cal: 20 mg/dL (ref 5–40)

## 2023-05-16 LAB — CMP14+EGFR
ALT: 37 IU/L — ABNORMAL HIGH (ref 0–32)
AST: 35 IU/L (ref 0–40)
Albumin: 4.4 g/dL (ref 3.9–4.9)
Alkaline Phosphatase: 82 IU/L (ref 44–121)
BUN/Creatinine Ratio: 13 (ref 12–28)
BUN: 19 mg/dL (ref 8–27)
Bilirubin Total: 0.6 mg/dL (ref 0.0–1.2)
CO2: 21 mmol/L (ref 20–29)
Calcium: 10.4 mg/dL — ABNORMAL HIGH (ref 8.7–10.3)
Chloride: 104 mmol/L (ref 96–106)
Creatinine, Ser: 1.42 mg/dL — ABNORMAL HIGH (ref 0.57–1.00)
Globulin, Total: 2.4 g/dL (ref 1.5–4.5)
Glucose: 94 mg/dL (ref 70–99)
Potassium: 4.6 mmol/L (ref 3.5–5.2)
Sodium: 142 mmol/L (ref 134–144)
Total Protein: 6.8 g/dL (ref 6.0–8.5)
eGFR: 41 mL/min/{1.73_m2} — ABNORMAL LOW (ref >59)

## 2023-05-16 LAB — B12 AND FOLATE PANEL
Folate: 11.3 ng/mL (ref >3.0)
Vitamin B-12: 1137 pg/mL (ref 232–1245)

## 2023-05-20 DIAGNOSIS — M48062 Spinal stenosis, lumbar region with neurogenic claudication: Secondary | ICD-10-CM | POA: Diagnosis not present

## 2023-05-22 ENCOUNTER — Other Ambulatory Visit: Payer: Self-pay | Admitting: Internal Medicine

## 2023-05-22 MED ORDER — GABAPENTIN 300 MG PO CAPS: ORAL_CAPSULE | ORAL | 0 refills | Status: DC

## 2023-05-23 ENCOUNTER — Encounter: Payer: Self-pay | Admitting: "Endocrinology

## 2023-05-23 ENCOUNTER — Ambulatory Visit: Admitting: "Endocrinology

## 2023-05-23 VITALS — BP 146/78 | HR 60 | Ht 66.0 in | Wt 155.4 lb

## 2023-05-23 DIAGNOSIS — M816 Localized osteoporosis [Lequesne]: Secondary | ICD-10-CM | POA: Diagnosis not present

## 2023-05-23 NOTE — Progress Notes (Signed)
 05/23/2023, 11:03 AM  Endocrinology follow-up note   Subjective:    Patient ID: Erica Gallegos, female    DOB: 1955/07/25, PCP Tobi Fortes, MD   Past Medical History:  Diagnosis Date   Anxiety    Arthritis    Complication of anesthesia    Patient woke up during anesthesia with bunionectomy   GERD (gastroesophageal reflux disease)    HLD (hyperlipidemia)    HTN (hypertension)    Major depressive disorder    Migraine with aura    PONV (postoperative nausea and vomiting)    RAS (renal artery stenosis) (HCC)    Scoliosis    Sleep apnea    Sleep related hypoxia    Spinal stenosis    Past Surgical History:  Procedure Laterality Date   BUNIONECTOMY     CATARACT EXTRACTION     CHOLECYSTECTOMY     COLONOSCOPY WITH PROPOFOL  N/A 12/13/2021   Procedure: COLONOSCOPY WITH PROPOFOL ;  Surgeon: Suzette Espy, MD;  [facility];  Service: Endoscopy;  Laterality: N/A;  8:00am, asa 2   ESOPHAGOGASTRODUODENOSCOPY (EGD) WITH PROPOFOL  N/A 04/05/2021   Procedure: ESOPHAGOGASTRODUODENOSCOPY (EGD) WITH PROPOFOL ;  Surgeon: Suzette Espy, MD;  [facility];  Service: Endoscopy;  Laterality: N/A;  10:15am   ESOPHAGOGASTRODUODENOSCOPY (EGD) WITH PROPOFOL  N/A 12/13/2021   Procedure: ESOPHAGOGASTRODUODENOSCOPY (EGD) WITH PROPOFOL ;  Surgeon: Suzette Espy, MD;  [facility];  Service: Endoscopy;  Laterality: N/A;   MALONEY DILATION N/A 04/05/2021   Procedure: [name] DILATION;  Surgeon: Suzette Espy, MD;  [facility];  Service: Endoscopy;  Laterality: N/A;   MALONEY DILATION N/A 12/13/2021   Procedure: [name] DILATION;  Surgeon: Suzette Espy, MD;  [facility];  Service: Endoscopy;  Laterality: N/A;   Social History   Socioeconomic History   Marital status: Married    Spouse name: Not on file   Number of children: 3   Years of education: Not on file   Highest education  level: Not on file  Occupational History   Not on file  Tobacco Use   Smoking status: Former    Types: Cigarettes   Smokeless tobacco: Never   Tobacco comments:    She quit smoking in the early 90's  Vaping Use   Vaping status: Every Day  Substance and Sexual Activity   Alcohol use: Yes    Comment: seldom   Drug use: Yes    Types: Marijuana    Comment: CBD cream; Marijuana a couple times a week. helps with her anxiety and migraines.   Sexual activity: Yes    Birth control/protection: None, Post-menopausal  Other Topics Concern   Not on file  Social History Narrative   Lives with her husband, retired.    Social Drivers of Corporate investment banker Strain: Low Risk  (08/30/2022)   Overall Financial Resource Strain (CARDIA)    Difficulty of Paying Living Expenses: Not hard at all  Food Insecurity: No Food Insecurity (08/30/2022)   Hunger Vital Sign    Worried About Running Out of Food in the Last Year: Never true    Ran Out of Food in the Last Year: Never true  Transportation Needs: No  Transportation Needs (08/30/2022)   PRAPARE - Administrator, Civil Service (Medical): No    Lack of Transportation (Non-Medical): No  Physical Activity: Sufficiently Active (08/30/2022)   Exercise Vital Sign    Days of Exercise per Week: 7 days    Minutes of Exercise per Session: 60 min  Stress: Stress Concern Present (08/30/2022)   [employer] of Occupational Health - Occupational Stress Questionnaire    Feeling of Stress : To some extent  Social Connections: Moderately Isolated (08/30/2022)   Social Connection and Isolation Panel [NHANES]    Frequency of Communication with Friends and Family: More than three times a week    Frequency of Social Gatherings with Friends and Family: Three times a week    Attends Religious Services: Never    Active Member of Clubs or Organizations: No    Attends Banker Meetings: Never    Marital Status: Married   Family History   Problem Relation Age of Onset   Cancer Mother    Hypertension Mother    Diabetes Mother    Heart disease Mother    Hyperlipidemia Mother    Cancer Father    Stroke Father    Hypertension Father    Heart disease Father    Hypertension Sister    Lupus Sister    Hypertension Sister    Colon polyps Sister    Cancer Brother    Brain cancer Brother    Non-Hodgkin's lymphoma Brother    Colon cancer Neg Hx    Outpatient Encounter Medications as of 05/23/2023  Medication Sig   acetaminophen  (TYLENOL ) 325 MG tablet Take 650 mg by mouth every 6 (six) hours as needed.   hydrOXYzine  (VISTARIL ) 25 MG capsule Take 1 capsule (25 mg total) by mouth every 8 (eight) hours as needed for itching.   atorvastatin  (LIPITOR) 40 MG tablet Take 1 tablet (40 mg total) by mouth daily.   buPROPion  (WELLBUTRIN  XL) 300 MG 24 hr tablet Take 1 tablet (300 mg total) by mouth every morning.   carvedilol  (COREG ) 12.5 MG tablet TAKE 1 TABLET(12.5 MG) BY MOUTH TWICE DAILY   chlorthalidone  (HYGROTON ) 25 MG tablet TAKE 1 AND 1/2 TABLETS(37.5 MG) BY MOUTH DAILY   Cholecalciferol  (VITAMIN D3) 50 MCG (2000 UT) TABS Take 2,000 Units by mouth daily.   cyclobenzaprine  (FLEXERIL ) 10 MG tablet TAKE 1 TABLET BY MOUTH EVERY AT BEDTIME AS NEEDED FOR BACK OR NERVE PAIN   DULoxetine  (CYMBALTA ) 60 MG capsule TAKE 1 CAPSULE(60 MG) BY MOUTH TWICE DAILY   esomeprazole  (NEXIUM ) 20 MG capsule TAKE 1 CAPSULE BY MOUTH EVERY DAY AT NOON   ezetimibe  (ZETIA ) 10 MG tablet Take 1 tablet (10 mg total) by mouth daily.   gabapentin  (NEURONTIN ) 300 MG capsule TAKE 1 CAPSULE BY MOUTH EVERY MORNING AND EVERY EVENING THEN TAKE 2 CAPSULES BY MOUTH AT BEDTIME   hydrALAZINE  (APRESOLINE ) 100 MG tablet Take 1 tablet (100 mg total) by mouth 2 (two) times daily.   lisinopril  (ZESTRIL ) 40 MG tablet Take 1 tablet (40 mg total) by mouth daily.   Melatonin 10 MG TABS Take 10 mg by mouth at bedtime.   Omega-3 Fatty Acids (FISH OIL) 1000 MG CAPS Take 4,000 mg by  mouth daily.   oxyCODONE-acetaminophen  (PERCOCET/ROXICET) 5-325 MG tablet Take 1 tablet by mouth every 6 (six) hours as needed.   QUEtiapine  (SEROQUEL ) 50 MG tablet Take 1-2 tablets (50-100 mg total) by mouth 2 (two) times daily.   spironolactone  (ALDACTONE ) 25 MG tablet  TAKE 1 TABLET(25 MG) BY MOUTH DAILY   vitamin B-12 (CYANOCOBALAMIN ) 500 MCG tablet Take 500 mcg by mouth daily.   vitamin C (ASCORBIC ACID) 500 MG tablet Take 500 mg by mouth daily.   No facility-administered encounter medications on file as of 05/23/2023.   ALLERGIES: Allergies  Allergen Reactions   Atenolol Swelling   Amlodipine Swelling    VACCINATION STATUS: Immunization History  Administered Date(s) Administered   Fluad Trivalent(High Dose 65+) 09/27/2022   Influenza,inj,Quad PF,6+ Mos 10/13/2020, 10/22/2021   Moderna Covid-19 Vaccine Bivalent Booster 69yrs & up 07/07/2020, 10/16/2020   Moderna SARS-COV2 Booster Vaccination 07/28/2019   Moderna Sars-Covid-2 Vaccination 11/23/2019   PFIZER(Purple Top)SARS-COV-2 Vaccination 03/12/2019, 04/02/2019   PNEUMOCOCCAL CONJUGATE-20 06/18/2022   Tdap 07/24/2021   Zoster Recombinant(Shingrix ) 07/24/2021, 10/22/2021    HPI Erica Gallegos is 68 y.o. female who presents today with a medical history as above. she is being seen in follow-up after she was seen in consultation for osteoporosis requested by Tobi Fortes, MD.   She was seen in June 2020 for with a bone density performed in October 2023 which showed a T-score of -2.3 on her hip, -1.7 on femur, and -1.2 on forearms.  Her spine was not included in the study due to degenerative joint disease. Her repeat bone density in August 2024 showed osteoporosis of left femoral neck-see below. Due to estimated higher fracture risk, after a brief intervention with oral Fosamax , she was switched to Reclast  IV.  She received her first treatment in October 2024.  She tolerated this treatment very well.   She is here today for  her interval follow-up. Due to previously documented mild hypercalcemia, she was offered repeat CMP and PTH. Her previsit labs show persistent mild hypercalcemia 10.4 associated with inappropriately normal PTH of 62.  She previously had higher PTH of 69.  She denies any prior history of nephrolithiasis.  She is not on calcium  supplements.  She denies any family history of hypercalcemia or parathyroid dysfunction.   She is accompanied by her husband to clinic.  She wears right upper extremity sling due to bruises she sustained when she bumped into a countertop tripped by her walker.  She did not hit the ground nor fractured any bones. Patient thinks she has lost some height, does have medical history of scoliosis.  She is on vitamin D  supplements.     She denies any history of fragility fractures.  Her other medical  problems include hypertension and hyperlipidemia on treatment. Her intake of dairy products is out of average.  She endorses diffuse body aches and neuropathy/radiculopathies.  She is more than 15 years postmenopausal, a former smoker.   Review of Systems  Constitutional: + Minimally fluctuating body weight, + fatigue, no subjective hyperthermia, no subjective hypothermia Eyes: no blurry vision, no xerophthalmia   Objective:       05/23/2023    9:56 AM 05/15/2023    6:52 PM 05/15/2023    8:40 AM  Vitals with BMI  Height 5\' 6"  5\' 6"    Weight 155 lbs 6 oz 155 lbs   BMI 25.09 25.03   Systolic 146 138 914  Diastolic 78 89 68  Pulse 60 66     BP (!) 146/78   Pulse 60   Ht 5\' 6"  (1.676 m)   Wt 155 lb 6.4 oz (70.5 kg)   BMI 25.08 kg/m   Wt Readings from Last 3 Encounters:  05/23/23 155 lb 6.4 oz (70.5 kg)  05/15/23 154  lb 15.7 oz (70.3 kg)  05/15/23 155 lb 3.2 oz (70.4 kg)    Physical Exam  Constitutional:  Body mass index is 25.08 kg/m.,  not in acute distress, normal state of mind Eyes: PERRLA, EOMI, no exophthalmos ENT: moist mucous membranes, no gross  thyromegaly, no gross cervical lymphadenopathy  Musculoskeletal: + Mild scoliotic deformity of the spine which was not included in the bone density , strength intact in all four extremities   CMP ( most recent) CMP     Component Value Date/Time   NA 142 05/15/2023 0920   K 4.6 05/15/2023 0920   CL 104 05/15/2023 0920   CO2 21 05/15/2023 0920   GLUCOSE 94 05/15/2023 0920   GLUCOSE 82 12/04/2021 1106   BUN 19 05/15/2023 0920   CREATININE 1.42 (H) 05/15/2023 0920   CREATININE 1.28 (H) 08/10/2021 1209   CALCIUM  10.4 (H) 05/15/2023 0920   PROT 6.8 05/15/2023 0920   ALBUMIN 4.4 05/15/2023 0920   AST 35 05/15/2023 0920   ALT 37 (H) 05/15/2023 0920   ALKPHOS 82 05/15/2023 0920   BILITOT 0.6 05/15/2023 0920   GFRNONAA 36 (L) 12/04/2021 1106    Lipid Panel     Component Value Date/Time   CHOL 160 05/15/2023 0920   TRIG 111 05/15/2023 0920   HDL 54 05/15/2023 0920   CHOLHDL 3.0 05/15/2023 0920   CHOLHDL 2.2 08/10/2021 1209   LDLCALC 86 05/15/2023 0920   LDLCALC 56 08/10/2021 1209   LABVLDL 20 05/15/2023 0920      Lab Results  Component Value Date   TSH 2.440 04/10/2023   TSH 1.120 11/20/2022   TSH 0.972 06/20/2022   TSH 1.720 02/26/2022   TSH 1.260 10/22/2021   TSH 1.45 09/21/2020   TSH 1.93 08/28/2020   FREET4 1.20 04/10/2023   FREET4 0.95 11/20/2022   FREET4 0.95 06/20/2022   FREET4 1.00 02/26/2022   FREET4 0.74 (L) 10/22/2021    Bone density latest on August 16, 2022  DualFemur Neck Left 08/16/2022 65.1 Osteoporosis -2.7 0.668 g/cm2 -6.8% Yes DualFemur Neck Left 07/02/2021 64.0 Osteopenia -2.3 0.717 g/cm2 - -   DualFemur Total Mean 08/16/2022 65.1 Osteopenia -1.9 0.764 g/cm2 -3.9% Yes DualFemur Total Mean 07/02/2021 64.0 Osteopenia -1.7 0.795 g/cm2 - -   Left Forearm Radius 33% 08/16/2022 65.1 Osteopenia -1.6 0.596 g/cm2 -4.7% - Left Forearm Radius 33% 07/02/2021 64.0 Osteopenia -1.2 0.626 g/cm2  Assessment & Plan:   1.  Osteoporosis of hip  2.   Hypercalcemia   -I have reviewed her new and available bone density studies as well as her labs. - Based on these reviews, she has has now bone density findings consistent with osteoporosis of hip, worsening from her prior study in 2023.   Her vertebral spine was not included in the study due to degenerative changes. She also continued to have mild, persistent hypercalcemia.  She has tolerated her first Reclast  infusion in October 2024.  She will be considered for her next treatment in October 2025.  Surveillance bone density will be requested in August 2024 to assess treatment effect.    Yearly IV Reclast  treatment for 3-6 years with potentially give her a drug holiday depending on her response and if she develops worsening osteoporosis, she can always be considered for Prolia at the end. If she does not tolerate or does not generate adequate treatment effect, she will be considered for Prolia intervention. I  Shared the pros and cons of this treatment options including the fact that when  she started Prolia it cannot be stopped for risk of atypical fracture.  She continues to have mild PTH independent hypercalcemia which only minimally respond to the Reclast  treatment.   She will need 24-hour urine calcium  collection to study her hypercalcemia better.  If she is found to have significant hypercalciuria, she will be considered for surgical treatment of primary hyperparathyroidism.    Fall precautions discussed with her.  Her dietary intake of protein seems to be adequate.   Her previsit  thyroid  function test was consistent with euthyroid state.    She is advised to stay away from calcium  supplements,  however continue on vitamin D  supplements. She will return in 3 weeks with her 24-hour urine studies.   Repeat  bone density will be requested during her next visit.     - she is advised to maintain close follow up with Kermit Ped Heath Litten, MD for primary care needs.   I spent  26  minutes in  the care of the patient today including review of labs from Thyroid  Function, CMP, and other relevant labs ; imaging/biopsy records (current and previous including abstractions from other facilities); face-to-face time discussing  her lab results and symptoms, medications doses, her options of short and long term treatment based on the latest standards of care / guidelines;   and documenting the encounter.  Erica Gallegos  participated in the discussions, expressed understanding, and voiced agreement with the above plans.  All questions were answered to her satisfaction. she is encouraged to contact clinic should she have any questions or concerns prior to her return visit.   Follow up plan: Return in about 3 weeks (around 06/13/2023) for Refer her for Reclast  Infusion for December 2025, 24 Hr Urine Ca & Cr.   Kalvin Orf, MD [facility] [address] [place], [place] [address] Phone: [phone]  Fax: [phone]     05/23/2023, 11:03 AM  This note was partially dictated with voice recognition software. Similar sounding words can be transcribed inadequately or may not  be corrected upon review.

## 2023-05-26 DIAGNOSIS — M816 Localized osteoporosis [Lequesne]: Secondary | ICD-10-CM | POA: Diagnosis not present

## 2023-05-27 LAB — CALCIUM, URINE, 24 HOUR
Calcium, 24H Urine: 29 mg/(24.h) (ref 0–320)
Calcium, Urine: 1.9 mg/dL

## 2023-05-27 LAB — CREATININE, URINE, 24 HOUR
Creatinine, 24H Ur: 878 mg/(24.h) (ref 800–1800)
Creatinine, Urine: 58.5 mg/dL

## 2023-05-28 ENCOUNTER — Ambulatory Visit (HOSPITAL_COMMUNITY): Admitting: Licensed Clinical Social Worker

## 2023-05-28 DIAGNOSIS — F32A Depression, unspecified: Secondary | ICD-10-CM | POA: Diagnosis not present

## 2023-05-28 DIAGNOSIS — F419 Anxiety disorder, unspecified: Secondary | ICD-10-CM | POA: Diagnosis not present

## 2023-05-28 NOTE — Progress Notes (Signed)
 THERAPIST PROGRESS NOTE   Session Date: 05/28/2023  Session Time: 0906 - 1011  Participation Level: Active  Behavioral Response: CasualAlertEuthymic  Type of Therapy: Individual Therapy  Treatment Goals addressed:  - LTG: Reduce frequency, intensity, and duration of depression symptoms so that daily functioning is improved (OP Depression) - LTG: Increase coping skills to manage depression and improve ability to perform daily activities (OP Depression) - STG: Nate "Erica Gallegos" will reduce frequency of avoidant behaviors by 50% as evidenced by self-report in therapy sessions (Anxiety)  ProgressTowards Goals: Not Progressing  Interventions: CBT, Motivational Interviewing, and Supportive  Summary: Erica Gallegos is a 68 y.o. female with past psych history of anxiety, depression, and PTSD, presenting for follow-up therapy session in efforts to improve management of depressive and anxious symptoms.   Pt actively engaged in session, presenting in overall pleasant moods and congruent affect throughout duration of visit.  Patient actively engaged in introductory check-in, sharing of having recent increase in instances of falls, noting of having fallen at home while cooking, recently while at [employer], and yesterday evening when attempting to stand up.  Patient further detailed of experiencing increased frequency of numbness in the right leg, contributing to fall instances, confirming upcoming appointments with neurology and pain management for concerns to be addressed.  Further engaged in processing how presenting challenges proved to impact mood and relationship with spouse, noting of continued challenges and interaction with spouse due to both patient and spouse experiencing negative moods and increased stress.  Actively engaged in exploration of continued efforts of seeking out social interactions outside the home and or relationship with husband, sharing of having made a new friend a recent doctor's visit,  with plans to reach out and explore possible arrangements of spending time together.  Actively engaged in further processing of social/community supports, processing importance of patient's faith, potential interest in attending church, and engaging in exploring local churches within patient's home community that she would be able to regularly attend.  Patient expressed intent on exploring various list of churches and community Center when traveling home today.  Patient responded well to interventions. Patient continues to meet criteria for Depression, Anxiety, and PTSD . Patient will continue to benefit from engagement in outpatient therapy due to being the least restrictive service to meet presenting needs.      05/15/2023    8:36 AM 02/28/2023   11:18 AM 02/04/2023   11:16 AM 01/09/2023   12:07 PM  GAD 7 : Generalized Anxiety Score  Nervous, Anxious, on Edge 1 1 1 2   Control/stop worrying 1 1 0 2  Worry too much - different things 1 0 0 1  Trouble relaxing 2 0 0 1  Restless 2 0 0 0  Easily annoyed or irritable 2 1 1 3   Afraid - awful might happen 1 0 0 0  Total GAD 7 Score 10 3 2 9   Anxiety Difficulty Somewhat difficult Somewhat difficult Not difficult at all Somewhat difficult      05/15/2023    8:36 AM 02/28/2023   11:27 AM 02/04/2023   11:18 AM 01/09/2023   12:10 PM 11/26/2022    9:52 AM  Depression screen PHQ 2/9  Decreased Interest 2 1 0 2 3  Down, Depressed, Hopeless 2 2 1 2 2   PHQ - 2 Score 4 3 1 4 5   Altered sleeping 2 1 2 3 3   Tired, decreased energy 2 1 1 3 2   Change in appetite 2 1 1  0 0  Feeling bad or  failure about yourself  2 1 0 1 3  Trouble concentrating 2 1 0 3 3  Moving slowly or fidgety/restless 2 1 0 0 0  Suicidal thoughts 0 1 0 0 0  PHQ-9 Score 16 10 5 14 16   Difficult doing work/chores  Somewhat difficult Not difficult at all Somewhat difficult Extremely dIfficult    Suicidal/Homicidal: Yes; pSI, no plans or intent.  Therapist Response: Clinician utilized  CBT, MI, and supportive reflection techniques to address pt presenting sxs and identified challenges.   Clinician actively greeted patient upon presenting for today's visit, engaging patient in introductory check-in, assessing presenting moods and affect, and further prompting patient's recounts of events and factors contributing to presenting moods.  Actively listened to patient's reflections of recent events and factors contributing to presenting moods, providing support and empathy for patient during recounts of having increased challenges with mobility and stability leading to recent falls.  Utilized open-ended questions to explore patient's thoughts and feelings surrounding presenting challenges, and processing impact on moods and interactions with spouse.  Utilized psychoeducational, CBT, and MI interventions to separation and navigating challenges experienced surrounding increased time spent with spouse and implications of patient's and spouse's moods on their relationship, and further processing patient's interests, efforts, and abilities at seeking out and engaging in social/community events or activities for added support.  Clinician reassessed severity of presenting sxs, and presence of any safety concerns. Therapist provided support and empathy to patient during session.  Plan: Return again in 3-4 weeks.  Diagnosis:  Encounter Diagnosis  Name Primary?   Anxiety and depression Yes    Collaboration of Care: Other None necessary at this time.  Patient/Guardian was advised Release of Information must be obtained prior to any record release in order to collaborate their care with an outside provider. Patient/Guardian was advised if they have not already done so to contact the registration department to sign all necessary forms in order for us  to release information regarding their care.   Consent: Patient/Guardian gives verbal consent for treatment and assignment of benefits for services  provided during this visit. Patient/Guardian expressed understanding and agreed to proceed.   Patsi Boots, MSW, LCSW 05/28/2023,  11:08 AM

## 2023-06-04 ENCOUNTER — Telehealth: Payer: Self-pay

## 2023-06-04 NOTE — Telephone Encounter (Signed)
 Copied from CRM [id number]. Topic: Clinical - Medication Question >> Jun 03, 2023  4:21 PM Ivette P wrote: Reason for CRM: PT called in to see about having all her medciation moving forward sent over to [facility]   [facility] - [place], [place] - [address] [place] [address] Phone: [phone] Fax: [phone]

## 2023-06-04 NOTE — Telephone Encounter (Signed)
 Pharmacy updated.

## 2023-06-09 DIAGNOSIS — M48062 Spinal stenosis, lumbar region with neurogenic claudication: Secondary | ICD-10-CM | POA: Diagnosis not present

## 2023-06-10 DIAGNOSIS — M5416 Radiculopathy, lumbar region: Secondary | ICD-10-CM | POA: Diagnosis not present

## 2023-06-18 ENCOUNTER — Ambulatory Visit (HOSPITAL_COMMUNITY): Admitting: Licensed Clinical Social Worker

## 2023-06-18 DIAGNOSIS — F330 Major depressive disorder, recurrent, mild: Secondary | ICD-10-CM

## 2023-06-18 DIAGNOSIS — F419 Anxiety disorder, unspecified: Secondary | ICD-10-CM | POA: Diagnosis not present

## 2023-06-18 NOTE — Progress Notes (Signed)
 THERAPIST PROGRESS NOTE   Session Date: 06/18/2023  Session Time: 0913 - 1005  Participation Level: Active  Behavioral Response: CasualAlertEuthymic  Type of Therapy: Individual Therapy  Treatment Goals addressed:  - LTG: Reduce frequency, intensity, and duration of depression symptoms so that daily functioning is improved (OP Depression) - LTG: Increase coping skills to manage depression and improve ability to perform daily activities (OP Depression) - STG: Eunice Winecoff will reduce frequency of avoidant behaviors by 50% as evidenced by self-report in therapy sessions (Anxiety)  ProgressTowards Goals: Progressing  Interventions: CBT, Motivational Interviewing, and Supportive  Summary: Thersia Flax is a 68 y.o. female with past psych history of anxiety, depression, and PTSD, presenting for follow-up therapy session in efforts to improve management of depressive and anxious symptoms.   Pt actively engaged in session, presenting in overall pleasant moods and congruent affect throughout duration of visit.  Patient actively engaged in introductory check-in, sharing of having been better, attributing moods to challenges of waking throughout the night with GI upset, and uncertainty of possible conditions. Pt further shared of having had recent visit from close friend of 30+ years from [place]. Who stayed the past week, finding time with friend enjoyable, engaging in socialization activities to include shopping, going to lunch, and spending increased time outside of the home, proving to experiencing improved moods and joy. Shares of having gotten a recent haircut and manicure, sharing of feeling better about self, recounting further of events and activities engaged in with friend during visit. Engaged in reassessing presenting depressive and anxious sxs via PHQ-9 and GAD-7, noting of significant reduction in sxs, and exploring thoughts, feelings, and perspectives of contributing factors. Further detailed  new friendship having been developed with sister of individual pt and husband bought house from, planning to engage in more frequent outings with friend.  Patient responded well to interventions. Patient continues to meet criteria for Depression, Anxiety, and PTSD . Patient will continue to benefit from engagement in outpatient therapy due to being the least restrictive service to meet presenting needs.      06/18/2023    9:27 AM 05/15/2023    8:36 AM 02/28/2023   11:18 AM 02/04/2023   11:16 AM  GAD 7 : Generalized Anxiety Score  Nervous, Anxious, on Edge 1 1 1 1   Control/stop worrying 0 1 1 0  Worry too much - different things 1 1 0 0  Trouble relaxing 1 2 0 0  Restless 0 2 0 0  Easily annoyed or irritable 0 2 1 1   Afraid - awful might happen 1 1 0 0  Total GAD 7 Score 4 10 3 2   Anxiety Difficulty Not difficult at all Somewhat difficult Somewhat difficult Not difficult at all      06/18/2023    9:43 AM 05/15/2023    8:36 AM 02/28/2023   11:27 AM 02/04/2023   11:18 AM 01/09/2023   12:10 PM  Depression screen PHQ 2/9  Decreased Interest 0 2 1 0 2  Down, Depressed, Hopeless 0 2 2 1 2   PHQ - 2 Score 0 4 3 1 4   Altered sleeping 0 2 1 2 3   Tired, decreased energy 0 2 1 1 3   Change in appetite 0 2 1 1  0  Feeling bad or failure about yourself  0 2 1 0 1  Trouble concentrating 0 2 1 0 3  Moving slowly or fidgety/restless 0 2 1 0 0  Suicidal thoughts 0 0 1 0 0  PHQ-9 Score 0 16  10 5 14   Difficult doing work/chores   Somewhat difficult Not difficult at all Somewhat difficult    Suicidal/Homicidal: Yes; pSI, no plans or intent.  Therapist Response: Clinician utilized CBT, MI, and supportive reflection techniques to address pt presenting sxs and identified challenges.   Clinician actively greeted patient upon presentation for today's visit, assessing presenting moods and affect, engaging in introductory check-in, actively listening to patient's recounts of recent events and factors  contributing to improved moods.  Reassessed presenting depressive and anxious symptoms via PHQ-9 and GAD-7, further evoking patient's thoughts, feelings, and perspectives surrounding observed reduction in symptoms, utilizing open-ended questions to further support patient and reflection of contributing factors.  Utilized CBT to support patient in processing relationship between moods and social interactions for patient, and MI to explore patient's interest and intent on further increasing socialization to support and improvements of moods.  Clinician reassessed severity of presenting sxs, and presence of any safety concerns. Therapist provided support and empathy to patient during session.  Plan: Return again in 3-4 weeks.  Diagnosis:  Encounter Diagnoses  Name Primary?   Anxiety Yes   MDD (major depressive disorder), recurrent episode, mild (HCC)      Collaboration of Care: Other None necessary at this time.  Patient/Guardian was advised Release of Information must be obtained prior to any record release in order to collaborate their care with an outside provider. Patient/Guardian was advised if they have not already done so to contact the registration department to sign all necessary forms in order for us  to release information regarding their care.   Consent: Patient/Guardian gives verbal consent for treatment and assignment of benefits for services provided during this visit. Patient/Guardian expressed understanding and agreed to proceed.   Patsi Boots, MSW, LCSW 06/18/2023,  9:51 AM

## 2023-06-19 ENCOUNTER — Ambulatory Visit (HOSPITAL_COMMUNITY): Admitting: Licensed Clinical Social Worker

## 2023-06-20 ENCOUNTER — Telehealth: Payer: Self-pay | Admitting: "Endocrinology

## 2023-06-20 ENCOUNTER — Encounter: Payer: Self-pay | Admitting: "Endocrinology

## 2023-06-20 ENCOUNTER — Ambulatory Visit: Admitting: "Endocrinology

## 2023-06-20 VITALS — BP 124/78 | HR 60 | Ht 66.0 in | Wt 153.0 lb

## 2023-06-20 DIAGNOSIS — M816 Localized osteoporosis [Lequesne]: Secondary | ICD-10-CM | POA: Diagnosis not present

## 2023-06-20 NOTE — Telephone Encounter (Signed)
 Please fax DEXA order to [facility]

## 2023-06-20 NOTE — Telephone Encounter (Signed)
 Will fax order for DEXA scan to [facility].

## 2023-06-20 NOTE — Progress Notes (Signed)
 06/20/2023, 9:27 AM  Endocrinology follow-up note   Subjective:    Patient ID: Erica Gallegos, female    DOB: 1955/07/19, PCP Tobi Fortes, MD   Past Medical History:  Diagnosis Date   Anxiety    Arthritis    Complication of anesthesia    Patient woke up during anesthesia with bunionectomy   GERD (gastroesophageal reflux disease)    HLD (hyperlipidemia)    HTN (hypertension)    Major depressive disorder    Migraine with aura    PONV (postoperative nausea and vomiting)    RAS (renal artery stenosis) (HCC)    Scoliosis    Sleep apnea    Sleep related hypoxia    Spinal stenosis    Past Surgical History:  Procedure Laterality Date   BUNIONECTOMY     CATARACT EXTRACTION     CHOLECYSTECTOMY     COLONOSCOPY WITH PROPOFOL  N/A 12/13/2021   Procedure: COLONOSCOPY WITH PROPOFOL ;  Surgeon: Suzette Espy, MD;  [facility];  Service: Endoscopy;  Laterality: N/A;  8:00am, asa 2   ESOPHAGOGASTRODUODENOSCOPY (EGD) WITH PROPOFOL  N/A 04/05/2021   Procedure: ESOPHAGOGASTRODUODENOSCOPY (EGD) WITH PROPOFOL ;  Surgeon: Suzette Espy, MD;  [facility]: [facility];  Service: Endoscopy;  Laterality: N/A;  10:15am   ESOPHAGOGASTRODUODENOSCOPY (EGD) WITH PROPOFOL  N/A 12/13/2021   Procedure: ESOPHAGOGASTRODUODENOSCOPY (EGD) WITH PROPOFOL ;  Surgeon: Suzette Espy, MD;  [facility];  Service: Endoscopy;  Laterality: N/A;   MALONEY DILATION N/A 04/05/2021   Procedure: [name] DILATION;  Surgeon: Suzette Espy, MD;  [facility]: [facility];  Service: Endoscopy;  Laterality: N/A;   MALONEY DILATION N/A 12/13/2021   Procedure: [name] DILATION;  Surgeon: Suzette Espy, MD;  [facility]: [facility];  Service: Endoscopy;  Laterality: N/A;   Social History   Socioeconomic History   Marital status: Married    Spouse name: Not on file   Number of children: 3   Years of education: Not on file   Highest education level:  Not on file  Occupational History   Not on file  Tobacco Use   Smoking status: Former    Types: Cigarettes   Smokeless tobacco: Never   Tobacco comments:    She quit smoking in the early 90's  Vaping Use   Vaping status: Every Day  Substance and Sexual Activity   Alcohol use: Yes    Comment: seldom   Drug use: Yes    Types: Marijuana    Comment: CBD cream; Marijuana a couple times a week. helps with her anxiety and migraines.   Sexual activity: Yes    Birth control/protection: None, Post-menopausal  Other Topics Concern   Not on file  Social History Narrative   Lives with her husband, retired.    Social Drivers of Corporate investment banker Strain: Low Risk  (08/30/2022)   Overall Financial Resource Strain (CARDIA)    Difficulty of Paying Living Expenses: Not hard at all  Food Insecurity: No Food Insecurity (08/30/2022)   Hunger Vital Sign    Worried About Running Out of Food in the Last Year: Never true    Ran Out of Food in the Last Year: Never true  Transportation Needs: No  Transportation Needs (08/30/2022)   PRAPARE - Administrator, Civil Service (Medical): No    Lack of Transportation (Non-Medical): No  Physical Activity: Sufficiently Active (08/30/2022)   Exercise Vital Sign    Days of Exercise per Week: 7 days    Minutes of Exercise per Session: 60 min  Stress: Stress Concern Present (08/30/2022)   [employer] of Occupational Health - Occupational Stress Questionnaire    Feeling of Stress : To some extent  Social Connections: Moderately Isolated (08/30/2022)   Social Connection and Isolation Panel    Frequency of Communication with Friends and Family: More than three times a week    Frequency of Social Gatherings with Friends and Family: Three times a week    Attends Religious Services: Never    Active Member of Clubs or Organizations: No    Attends Banker Meetings: Never    Marital Status: Married   Family History  Problem  Relation Age of Onset   Cancer Mother    Hypertension Mother    Diabetes Mother    Heart disease Mother    Hyperlipidemia Mother    Cancer Father    Stroke Father    Hypertension Father    Heart disease Father    Hypertension Sister    Lupus Sister    Hypertension Sister    Colon polyps Sister    Cancer Brother    Brain cancer Brother    Non-Hodgkin's lymphoma Brother    Colon cancer Neg Hx    Outpatient Encounter Medications as of 06/20/2023  Medication Sig   acetaminophen  (TYLENOL ) 325 MG tablet Take 650 mg by mouth every 6 (six) hours as needed.   atorvastatin  (LIPITOR) 40 MG tablet Take 1 tablet (40 mg total) by mouth daily.   buPROPion  (WELLBUTRIN  XL) 300 MG 24 hr tablet Take 1 tablet (300 mg total) by mouth every morning.   carvedilol  (COREG ) 12.5 MG tablet TAKE 1 TABLET(12.5 MG) BY MOUTH TWICE DAILY   chlorthalidone  (HYGROTON ) 25 MG tablet TAKE 1 AND 1/2 TABLETS(37.5 MG) BY MOUTH DAILY   Cholecalciferol  (VITAMIN D3) 50 MCG (2000 UT) TABS Take 2,000 Units by mouth daily.   cyclobenzaprine  (FLEXERIL ) 10 MG tablet TAKE 1 TABLET BY MOUTH EVERY AT BEDTIME AS NEEDED FOR BACK OR NERVE PAIN   DULoxetine  (CYMBALTA ) 60 MG capsule TAKE 1 CAPSULE(60 MG) BY MOUTH TWICE DAILY   esomeprazole  (NEXIUM ) 20 MG capsule TAKE 1 CAPSULE BY MOUTH EVERY DAY AT NOON   ezetimibe  (ZETIA ) 10 MG tablet Take 1 tablet (10 mg total) by mouth daily.   gabapentin  (NEURONTIN ) 300 MG capsule TAKE 1 CAPSULE BY MOUTH EVERY MORNING AND EVERY EVENING THEN TAKE 2 CAPSULES BY MOUTH AT BEDTIME   hydrALAZINE  (APRESOLINE ) 100 MG tablet Take 1 tablet (100 mg total) by mouth 2 (two) times daily.   hydrOXYzine  (VISTARIL ) 25 MG capsule Take 1 capsule (25 mg total) by mouth every 8 (eight) hours as needed for itching.   lisinopril  (ZESTRIL ) 40 MG tablet Take 1 tablet (40 mg total) by mouth daily.   Melatonin 10 MG TABS Take 10 mg by mouth at bedtime.   Omega-3 Fatty Acids (FISH OIL) 1000 MG CAPS Take 4,000 mg by mouth daily.    oxyCODONE-acetaminophen  (PERCOCET/ROXICET) 5-325 MG tablet Take 1 tablet by mouth every 6 (six) hours as needed.   QUEtiapine  (SEROQUEL ) 50 MG tablet Take 1-2 tablets (50-100 mg total) by mouth 2 (two) times daily.   spironolactone  (ALDACTONE ) 25 MG tablet TAKE  1 TABLET(25 MG) BY MOUTH DAILY   vitamin B-12 (CYANOCOBALAMIN ) 500 MCG tablet Take 500 mcg by mouth daily.   vitamin C (ASCORBIC ACID) 500 MG tablet Take 500 mg by mouth daily.   No facility-administered encounter medications on file as of 06/20/2023.   ALLERGIES: Allergies  Allergen Reactions   Atenolol Swelling   Amlodipine Swelling    VACCINATION STATUS: Immunization History  Administered Date(s) Administered   Fluad Trivalent(High Dose 65+) 09/27/2022   Influenza,inj,Quad PF,6+ Mos 10/13/2020, 10/22/2021   Moderna Covid-19 Vaccine Bivalent Booster 82yrs & up 07/07/2020, 10/16/2020   Moderna SARS-COV2 Booster Vaccination 07/28/2019   Moderna Sars-Covid-2 Vaccination 11/23/2019   PFIZER(Purple Top)SARS-COV-2 Vaccination 03/12/2019, 04/02/2019   PNEUMOCOCCAL CONJUGATE-20 06/18/2022   Tdap 07/24/2021   Zoster Recombinant(Shingrix ) 07/24/2021, 10/22/2021    HPI LILAS DIEFENDORF is 68 y.o. female who presents today with a medical history as above. she is being seen in follow-up after she was seen in consultation for osteoporosis requested by Tobi Fortes, MD.   She was seen in June 2020 for with a bone density performed in October 2023 which showed a T-score of -2.3 on her hip, -1.7 on femur, and -1.2 on forearms.  Her spine was not included in the study due to degenerative joint disease. Her repeat bone density in August 2024 showed osteoporosis of left femoral neck-see below. Due to estimated higher fracture risk, after a brief intervention with oral Fosamax , she was switched to Reclast  IV.  She received her first treatment in October 2024.  She tolerated this treatment very well.   Due to ongoing mild hypercalcemia,  during her last visit, she was offered 24-hour urine collection for calcium  measurement.  She is here today to discuss that results.  This test did not show significant hypercalciuria-24-hour urine calcium  was only 29 mg with 878 mg of urine creatinine.  She did have mildly elevated PTH along with mild hypercalcemia in the prior measurements.  Review of her medical records as far back as September 2022 she did show mild hypercalcemia.  Her previous studies are not available to review.  She moved to this area 4 years ago from [place] .  She denies nephrolithiasis, does not report family history of hypercalcemia which would require surgical treatment.  Her highest PTH documented was 27 in November 2024 associated with normal calcium  of 9.3.       She is accompanied by her husband to clinic.     Patient thinks she has lost some height, does have medical history of scoliosis.  She is on vitamin D  supplements.   Denies any prior history of fragility fractures.  Her other medical  problems include hypertension and hyperlipidemia on treatment. Her intake of dairy products is out of average.  She endorses diffuse body aches and neuropathy/radiculopathies.  She is more than 15 years postmenopausal, a former smoker.   Review of Systems  Constitutional: + Minimally fluctuating body weight, + fatigue, no subjective hyperthermia, no subjective hypothermia Eyes: no blurry vision, no xerophthalmia   Objective:       06/20/2023    9:19 AM 05/23/2023    9:56 AM 05/15/2023    6:52 PM  Vitals with BMI  Height 5' 6 5' 6 5' 6  Weight 153 lbs 155 lbs 6 oz 155 lbs  BMI 24.71 25.09 25.03  Systolic  146 138  Diastolic  78 89  Pulse  60 66    Ht 5' 6 (1.676 m)   Wt 153 lb (69.4 kg)  BMI 24.69 kg/m   Wt Readings from Last 3 Encounters:  06/20/23 153 lb (69.4 kg)  05/23/23 155 lb 6.4 oz (70.5 kg)  05/15/23 154 lb 15.7 oz (70.3 kg)    Physical Exam  Constitutional:  Body mass index is 24.69  kg/m.,  not in acute distress, normal state of mind Eyes: PERRLA, EOMI, no exophthalmos ENT: moist mucous membranes, no gross thyromegaly, no gross cervical lymphadenopathy  Musculoskeletal: + Mild scoliotic deformity of the spine which was not included in the bone density , strength intact in all four extremities   CMP ( most recent) CMP     Component Value Date/Time   NA 142 05/15/2023 0920   K 4.6 05/15/2023 0920   CL 104 05/15/2023 0920   CO2 21 05/15/2023 0920   GLUCOSE 94 05/15/2023 0920   GLUCOSE 82 12/04/2021 1106   BUN 19 05/15/2023 0920   CREATININE 1.42 (H) 05/15/2023 0920   CREATININE 1.28 (H) 08/10/2021 1209   CALCIUM  10.4 (H) 05/15/2023 0920   PROT 6.8 05/15/2023 0920   ALBUMIN 4.4 05/15/2023 0920   AST 35 05/15/2023 0920   ALT 37 (H) 05/15/2023 0920   ALKPHOS 82 05/15/2023 0920   BILITOT 0.6 05/15/2023 0920   GFRNONAA 36 (L) 12/04/2021 1106    Lipid Panel     Component Value Date/Time   CHOL 160 05/15/2023 0920   TRIG 111 05/15/2023 0920   HDL 54 05/15/2023 0920   CHOLHDL 3.0 05/15/2023 0920   CHOLHDL 2.2 08/10/2021 1209   LDLCALC 86 05/15/2023 0920   LDLCALC 56 08/10/2021 1209   LABVLDL 20 05/15/2023 0920      Lab Results  Component Value Date   TSH 2.440 04/10/2023   TSH 1.120 11/20/2022   TSH 0.972 06/20/2022   TSH 1.720 02/26/2022   TSH 1.260 10/22/2021   TSH 1.45 09/21/2020   TSH 1.93 08/28/2020   FREET4 1.20 04/10/2023   FREET4 0.95 11/20/2022   FREET4 0.95 06/20/2022   FREET4 1.00 02/26/2022   FREET4 0.74 (L) 10/22/2021    Bone density latest on August 16, 2022  DualFemur Neck Left 08/16/2022 65.1 Osteoporosis -2.7 0.668 g/cm2 -6.8% Yes DualFemur Neck Left 07/02/2021 64.0 Osteopenia -2.3 0.717 g/cm2 - -   DualFemur Total Mean 08/16/2022 65.1 Osteopenia -1.9 0.764 g/cm2 -3.9% Yes DualFemur Total Mean 07/02/2021 64.0 Osteopenia -1.7 0.795 g/cm2 - -   Left Forearm Radius 33% 08/16/2022 65.1 Osteopenia -1.6 0.596 g/cm2 -4.7%  - Left Forearm Radius 33% 07/02/2021 64.0 Osteopenia -1.2 0.626 g/cm2  Assessment & Plan:   1.  Osteoporosis of hip  2.  Hypercalcemia   -I have reviewed her new and available bone density studies as well as her labs. - Based on these reviews, she has has now bone density findings consistent with osteoporosis of hip, worsening from her prior study in 2023.   Her vertebral spine was not included in the study due to degenerative changes. She also continued to have mild, persistent hypercalcemia.  24-hour urine calcium  was 29 mg / 24 hours against primary hyperparathyroidism.  FHH is not ruled out.  Patient denies any family history of hypercalcemia which  required parathyroidectomy.  No history of nephrolithiasis. Primary hyperparathyroidism is not confirmed at this time.  She will be put on expectant management. She will try to get her prior medical records for review.  She has tolerated her first Reclast  infusion in October 2024.  She will be considered for her next treatment in October 2025.  Surveillance bone  density will be requested after August 2025  to assess treatment effect.    Yearly IV Reclast  treatment for 3-6 years with potentially give her a drug holiday depending on her response and if she develops worsening osteoporosis, she can always be considered for Prolia at the end. If she does not tolerate or does not generate adequate treatment effect, she will be considered for Prolia intervention. I  Shared the pros and cons of this treatment options including the fact that when she started Prolia it cannot be stopped for risk of atypical fracture.    Fall precautions discussed with her.  Her dietary intake of protein seems to be adequate.   Her previsit  thyroid  function test was consistent with euthyroid state.    She is advised to stay away from calcium  supplements,  however continue on vitamin D  supplements.   - she is advised to maintain close follow up with Kermit Ped Heath Litten, MD for primary care needs.  I spent  22  minutes in the care of the patient today including review of labs from Thyroid  Function, CMP, and other relevant labs ; imaging/biopsy records (current and previous including abstractions from other facilities); face-to-face time discussing  her lab results and symptoms, medications doses, her options of short and long term treatment based on the latest standards of care / guidelines;   and documenting the encounter.  Erica Gallegos  participated in the discussions, expressed understanding, and voiced agreement with the above plans.  All questions were answered to her satisfaction. she is encouraged to contact clinic should she have any questions or concerns prior to her return visit.  Follow up plan: No follow-ups on file.   Kalvin Orf, MD [facility] [address] [place], [place] [address] Phone: [phone]  Fax: [phone]     06/20/2023, 9:27 AM  This note was partially dictated with voice recognition software. Similar sounding words can be transcribed inadequately or may not  be corrected upon review.

## 2023-06-23 ENCOUNTER — Other Ambulatory Visit (HOSPITAL_COMMUNITY): Payer: Self-pay | Admitting: *Deleted

## 2023-06-23 DIAGNOSIS — F330 Major depressive disorder, recurrent, mild: Secondary | ICD-10-CM

## 2023-06-23 DIAGNOSIS — F419 Anxiety disorder, unspecified: Secondary | ICD-10-CM

## 2023-06-23 DIAGNOSIS — F4310 Post-traumatic stress disorder, unspecified: Secondary | ICD-10-CM

## 2023-06-24 ENCOUNTER — Other Ambulatory Visit: Payer: Self-pay

## 2023-06-24 DIAGNOSIS — K219 Gastro-esophageal reflux disease without esophagitis: Secondary | ICD-10-CM

## 2023-06-24 DIAGNOSIS — L299 Pruritus, unspecified: Secondary | ICD-10-CM

## 2023-06-24 DIAGNOSIS — E782 Mixed hyperlipidemia: Secondary | ICD-10-CM

## 2023-06-24 MED ORDER — LISINOPRIL 40 MG PO TABS: 40.0000 mg | ORAL_TABLET | Freq: Every day | ORAL | 1 refills | Status: DC

## 2023-06-24 MED ORDER — ESOMEPRAZOLE MAGNESIUM 20 MG PO CPDR: 20.0000 mg | DELAYED_RELEASE_CAPSULE | Freq: Every day | ORAL | 1 refills | Status: AC

## 2023-06-24 MED ORDER — CARVEDILOL 12.5 MG PO TABS: ORAL_TABLET | ORAL | 3 refills | Status: AC

## 2023-06-24 MED ORDER — HYDROXYZINE PAMOATE 25 MG PO CAPS: 25.0000 mg | ORAL_CAPSULE | Freq: Three times a day (TID) | ORAL | 0 refills | Status: DC | PRN

## 2023-06-24 MED ORDER — ATORVASTATIN CALCIUM 40 MG PO TABS: 40.0000 mg | ORAL_TABLET | Freq: Every day | ORAL | 3 refills | Status: AC

## 2023-06-24 MED ORDER — CYCLOBENZAPRINE HCL 10 MG PO TABS: ORAL_TABLET | ORAL | 0 refills | Status: DC

## 2023-06-24 MED ORDER — CHLORTHALIDONE 25 MG PO TABS: ORAL_TABLET | ORAL | 3 refills | Status: AC

## 2023-06-24 MED ORDER — GABAPENTIN 300 MG PO CAPS: ORAL_CAPSULE | ORAL | 0 refills | Status: DC

## 2023-06-24 MED ORDER — SPIRONOLACTONE 25 MG PO TABS
ORAL_TABLET | ORAL | 1 refills | Status: AC
Start: 2023-06-24 — End: ?

## 2023-06-24 MED ORDER — EZETIMIBE 10 MG PO TABS
10.0000 mg | ORAL_TABLET | Freq: Every day | ORAL | 3 refills | Status: AC
Start: 2023-06-24 — End: ?

## 2023-06-30 ENCOUNTER — Other Ambulatory Visit: Payer: Self-pay

## 2023-06-30 MED ORDER — HYDRALAZINE HCL 100 MG PO TABS
100.0000 mg | ORAL_TABLET | Freq: Two times a day (BID) | ORAL | 0 refills | Status: DC
Start: 2023-06-30 — End: 2023-11-24

## 2023-07-02 DIAGNOSIS — M48062 Spinal stenosis, lumbar region with neurogenic claudication: Secondary | ICD-10-CM | POA: Diagnosis not present

## 2023-07-03 ENCOUNTER — Ambulatory Visit (HOSPITAL_COMMUNITY): Admitting: Licensed Clinical Social Worker

## 2023-07-04 ENCOUNTER — Other Ambulatory Visit: Payer: Self-pay

## 2023-07-04 ENCOUNTER — Telehealth: Payer: Self-pay | Admitting: General Practice

## 2023-07-04 ENCOUNTER — Telehealth (HOSPITAL_COMMUNITY): Payer: Self-pay

## 2023-07-04 DIAGNOSIS — F4310 Post-traumatic stress disorder, unspecified: Secondary | ICD-10-CM

## 2023-07-04 DIAGNOSIS — F419 Anxiety disorder, unspecified: Secondary | ICD-10-CM

## 2023-07-04 DIAGNOSIS — F330 Major depressive disorder, recurrent, mild: Secondary | ICD-10-CM

## 2023-07-04 MED ORDER — QUETIAPINE FUMARATE 50 MG PO TABS: 50.0000 mg | ORAL_TABLET | Freq: Two times a day (BID) | ORAL | 0 refills | Status: DC

## 2023-07-04 NOTE — Telephone Encounter (Signed)
 Pharmacy sent in fax requesting a refill for DULoxetine  (CYMBALTA ) 60 MG capsule and buPROPion  (WELLBUTRIN  XL) 300 MG 24 hr tablet  please advise  Last visit 01-31-23 Next visit 07-10-23   Preferred pharmacy [facility] - [place], [place] - [address] Phone: [phone]  Fax: [phone]

## 2023-07-04 NOTE — Telephone Encounter (Signed)
 Copied from CRM [id number]. Topic: Clinical - Medication Question >> Jul 04, 2023  1:50 PM Santiya F wrote: Reason for CRM: Burnard with [facility] is calling in to request a new prescription be sent in for QUEtiapine  (SEROQUEL ) 50 MG tablet [[id number]] ENDED.

## 2023-07-05 ENCOUNTER — Other Ambulatory Visit: Payer: Self-pay | Admitting: Internal Medicine

## 2023-07-09 ENCOUNTER — Ambulatory Visit (HOSPITAL_COMMUNITY): Admission: RE | Admit: 2023-07-09 | Source: Ambulatory Visit

## 2023-07-09 ENCOUNTER — Other Ambulatory Visit: Payer: Self-pay

## 2023-07-09 ENCOUNTER — Telehealth: Payer: Self-pay

## 2023-07-09 DIAGNOSIS — F4310 Post-traumatic stress disorder, unspecified: Secondary | ICD-10-CM

## 2023-07-09 DIAGNOSIS — F419 Anxiety disorder, unspecified: Secondary | ICD-10-CM

## 2023-07-09 DIAGNOSIS — F330 Major depressive disorder, recurrent, mild: Secondary | ICD-10-CM

## 2023-07-09 MED ORDER — QUETIAPINE FUMARATE 50 MG PO TABS: 50.0000 mg | ORAL_TABLET | Freq: Two times a day (BID) | ORAL | 3 refills | Status: DC

## 2023-07-09 NOTE — Telephone Encounter (Signed)
 Pt called stating she was scheduled for DEXA scan this morning but was told her insurance would not cover scan since she had one on 08/16/2022 unless medical necessity reason for yearly DEXA scan.

## 2023-07-10 ENCOUNTER — Encounter (HOSPITAL_COMMUNITY): Payer: Self-pay | Admitting: Psychiatry

## 2023-07-10 ENCOUNTER — Other Ambulatory Visit: Payer: Self-pay

## 2023-07-10 ENCOUNTER — Ambulatory Visit (HOSPITAL_COMMUNITY): Admitting: Psychiatry

## 2023-07-10 VITALS — BP 159/77 | HR 66 | Ht 66.0 in | Wt 159.0 lb

## 2023-07-10 DIAGNOSIS — Z630 Problems in relationship with spouse or partner: Secondary | ICD-10-CM

## 2023-07-10 DIAGNOSIS — F330 Major depressive disorder, recurrent, mild: Secondary | ICD-10-CM

## 2023-07-10 DIAGNOSIS — F4310 Post-traumatic stress disorder, unspecified: Secondary | ICD-10-CM

## 2023-07-10 DIAGNOSIS — F419 Anxiety disorder, unspecified: Secondary | ICD-10-CM | POA: Diagnosis not present

## 2023-07-10 MED ORDER — BUPROPION HCL ER (XL) 300 MG PO TB24: 300.0000 mg | ORAL_TABLET | ORAL | 2 refills | Status: DC

## 2023-07-10 MED ORDER — CLONAZEPAM 0.5 MG PO TABS: 0.5000 mg | ORAL_TABLET | Freq: Every day | ORAL | 2 refills | Status: DC

## 2023-07-10 NOTE — Telephone Encounter (Signed)
 Pt made aware
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
## 2023-07-15 ENCOUNTER — Encounter: Payer: Self-pay | Admitting: Cardiology

## 2023-07-15 ENCOUNTER — Ambulatory Visit: Attending: Cardiology | Admitting: Cardiology

## 2023-07-15 VITALS — BP 120/70 | HR 61 | Ht 66.0 in | Wt 166.0 lb

## 2023-07-15 DIAGNOSIS — R600 Localized edema: Secondary | ICD-10-CM | POA: Diagnosis not present

## 2023-07-15 DIAGNOSIS — R079 Chest pain, unspecified: Secondary | ICD-10-CM | POA: Diagnosis not present

## 2023-07-15 DIAGNOSIS — E782 Mixed hyperlipidemia: Secondary | ICD-10-CM

## 2023-07-15 DIAGNOSIS — I1A0 Resistant hypertension: Secondary | ICD-10-CM | POA: Diagnosis not present

## 2023-07-15 MED ORDER — FUROSEMIDE 20 MG PO TABS: 20.0000 mg | ORAL_TABLET | ORAL | 3 refills | Status: DC | PRN

## 2023-07-15 NOTE — Patient Instructions (Signed)
 Medication Instructions:   Begin Lasix  20mg  as needed for swelling  Continue all other medications.     Labwork:  none  Testing/Procedures:  Your physician has requested that you have an echocardiogram. Echocardiography is a painless test that uses sound waves to create images of your heart. It provides your doctor with information about the size and shape of your heart and how well your heart's chambers and valves are working. This procedure takes approximately one hour. There are no restrictions for this procedure. Please do NOT wear cologne, perfume, aftershave, or lotions (deodorant is allowed). Please arrive 15 minutes prior to your appointment time.  Please note: We ask at that you not bring children with you during ultrasound (echo/ vascular) testing. Due to room size and safety concerns, children are not allowed in the ultrasound rooms during exams. Our front office staff cannot provide observation of children in our lobby area while testing is being conducted. An adult accompanying a patient to their appointment will only be allowed in the ultrasound room at the discretion of the ultrasound technician under special circumstances. We apologize for any inconvenience. Office will contact with results via phone, letter or mychart.    Follow-Up:  6 weeks   Any Other Special Instructions Will Be Listed Below (If Applicable).   If you need a refill on your cardiac medications before your next appointment, please call your pharmacy.

## 2023-07-15 NOTE — Progress Notes (Signed)
 Clinical Summary Erica Gallegos is a 68 y.o.female seen today for follow up of the following medical problems.    Resistant HTN - recent issues with high bp's, SBPs up to 200s at times - 09/2020 normal renin/aldo levels - 08/2020 renal artery US  no significant stenosis, only mild left sided disease - has known sleep apnea       - we weaned clonidine and then stopped, was having fatigue and bradycardia - we changed HCTZ to chlorthalidone    - compliant with meds      2. OSA - just got new cpap, compliant   3. HLD - 05/2023 TC 160 TG 111 HDL 53 LDL 86   4. CKD - followed by pcp  5. Hypercalcemia/Ostoporosis -followed by endocrine  6. LE edema - started  1 weeks ago, bilateral swelling nonpainful - no new SOB/DOE  7. Chest pain - episode last week. Woke up with the pain. Tightness 7/10 in severity, some nausea. Some related jaw pain. Does not remember if positional, pain lasted 10-15 minutes.  - No recurrences - for the last week some leg swelling,    SH: grandaughter 75 months old lives in [place] .  Past Medical History:  Diagnosis Date   Anxiety    Arthritis    Complication of anesthesia    Patient woke up during anesthesia with bunionectomy   GERD (gastroesophageal reflux disease)    HLD (hyperlipidemia)    HTN (hypertension)    Major depressive disorder    Migraine with aura    PONV (postoperative nausea and vomiting)    RAS (renal artery stenosis) (HCC)    Scoliosis    Sleep apnea    Sleep related hypoxia    Spinal stenosis      Allergies  Allergen Reactions   Atenolol Swelling   Amlodipine Swelling     Current Outpatient Medications  Medication Sig Dispense Refill   acetaminophen  (TYLENOL ) 325 MG tablet Take 650 mg by mouth every 6 (six) hours as needed.     atorvastatin  (LIPITOR) 40 MG tablet Take 1 tablet (40 mg total) by mouth daily. 90 tablet 3   buPROPion  (WELLBUTRIN  XL) 300 MG 24 hr tablet Take 1 tablet (300 mg total) by mouth  every morning. 30 tablet 2   carvedilol  (COREG ) 12.5 MG tablet TAKE 1 TABLET(12.5 MG) BY MOUTH TWICE DAILY 180 tablet 3   chlorthalidone  (HYGROTON ) 25 MG tablet TAKE 1 AND 1/2 TABLETS(37.5 MG) BY MOUTH DAILY 135 tablet 3   Cholecalciferol  (VITAMIN D3) 50 MCG (2000 UT) TABS Take 2,000 Units by mouth daily.     clonazePAM  (KLONOPIN ) 0.5 MG tablet Take 1 tablet (0.5 mg total) by mouth at bedtime. 30 tablet 2   cyclobenzaprine  (FLEXERIL ) 10 MG tablet TAKE 1 TABLET BY MOUTH EVERY AT BEDTIME AS NEEDED FOR BACK OR NERVE PAIN 30 tablet 0   DULoxetine  (CYMBALTA ) 60 MG capsule TAKE 1 CAPSULE(60 MG) BY MOUTH TWICE DAILY 90 capsule 1   esomeprazole  (NEXIUM ) 20 MG capsule Take 1 capsule (20 mg total) by mouth daily at 12 noon. 90 capsule 1   ezetimibe  (ZETIA ) 10 MG tablet Take 1 tablet (10 mg total) by mouth daily. 90 tablet 3   gabapentin  (NEURONTIN ) 300 MG capsule TAKE 1 CAPSULE BY MOUTH EVERY MORNING AND EVERY EVENING THEN TAKE 2 CAPSULES BY MOUTH AT BEDTIME 360 capsule 0   hydrALAZINE  (APRESOLINE ) 100 MG tablet Take 1 tablet (100 mg total) by mouth 2 (two) times daily. 180 tablet 0  hydrOXYzine  (VISTARIL ) 25 MG capsule Take 1 capsule (25 mg total) by mouth every 8 (eight) hours as needed for itching. 30 capsule 0   lisinopril  (ZESTRIL ) 40 MG tablet Take 1 tablet (40 mg total) by mouth daily. 90 tablet 1   Melatonin 10 MG TABS Take 10 mg by mouth at bedtime.     Omega-3 Fatty Acids (FISH OIL) 1000 MG CAPS Take 4,000 mg by mouth daily.     oxyCODONE-acetaminophen  (PERCOCET/ROXICET) 5-325 MG tablet Take 1 tablet by mouth every 6 (six) hours as needed.     QUEtiapine  (SEROQUEL ) 50 MG tablet Take 1 tablet (50 mg total) by mouth 2 (two) times daily. 180 tablet 3   spironolactone  (ALDACTONE ) 25 MG tablet TAKE 1 TABLET(25 MG) BY MOUTH DAILY 90 tablet 1   vitamin B-12 (CYANOCOBALAMIN ) 500 MCG tablet Take 500 mcg by mouth daily.     vitamin C (ASCORBIC ACID) 500 MG tablet Take 500 mg by mouth daily.     No  current facility-administered medications for this visit.     Past Surgical History:  Procedure Laterality Date   BUNIONECTOMY     CATARACT EXTRACTION     CHOLECYSTECTOMY     COLONOSCOPY WITH PROPOFOL  N/A 12/13/2021   Procedure: COLONOSCOPY WITH PROPOFOL ;  Surgeon: Shaaron Lamar HERO, MD;  [facility];  Service: Endoscopy;  Laterality: N/A;  8:00am, asa 2   ESOPHAGOGASTRODUODENOSCOPY (EGD) WITH PROPOFOL  N/A 04/05/2021   Procedure: ESOPHAGOGASTRODUODENOSCOPY (EGD) WITH PROPOFOL ;  Surgeon: Shaaron Lamar HERO, MD;  [facility];  Service: Endoscopy;  Laterality: N/A;  10:15am   ESOPHAGOGASTRODUODENOSCOPY (EGD) WITH PROPOFOL  N/A 12/13/2021   Procedure: ESOPHAGOGASTRODUODENOSCOPY (EGD) WITH PROPOFOL ;  Surgeon: Shaaron Lamar HERO, MD;  [facility];  Service: Endoscopy;  Laterality: N/A;   MALONEY DILATION N/A 04/05/2021   Procedure: AGAPITO DILATION;  Surgeon: Shaaron Lamar HERO, MD;  [facility];  Service: Endoscopy;  Laterality: N/A;   MALONEY DILATION N/A 12/13/2021   Procedure: AGAPITO DILATION;  Surgeon: Shaaron Lamar HERO, MD;  [facility];  Service: Endoscopy;  Laterality: N/A;     Allergies  Allergen Reactions   Atenolol Swelling   Amlodipine Swelling      Family History  Problem Relation Age of Onset   Cancer Mother    Hypertension Mother    Diabetes Mother    Heart disease Mother    Hyperlipidemia Mother    Cancer Father    Stroke Father    Hypertension Father    Heart disease Father    Hypertension Sister    Lupus Sister    Hypertension Sister    Colon polyps Sister    Cancer Brother    Brain cancer Brother    Non-Hodgkin's lymphoma Brother    Colon cancer Neg Hx      Social History Erica Gallegos reports that she has quit smoking. Her smoking use included cigarettes. She has never used smokeless tobacco. Erica Gallegos reports current alcohol use.    Physical Examination Today's Vitals   07/15/23 1249  BP: 120/70   Pulse: 61  SpO2: 98%  Weight: 166 lb (75.3 kg)  Height: 5' 6 (1.676 m)   Body mass index is 26.79 kg/m.  Gen: resting comfortably, no acute distress HEENT: no scleral icterus, pupils equal round and reactive, no palptable cervical adenopathy,  CV: RRR, no mrg, no jvd Resp: Clear to auscultation bilaterally GI: abdomen is soft, non-tender, non-distended, normal bowel sounds, no hepatosplenomegaly MSK: extremities are  warm, no edema.  Skin: warm, no rash Neuro:  no focal deficits Psych: appropriate affect     Assessment and Plan  Resistant HTN - well controlled, continue current meds  2. Chest pain - isolated episode of unclear etiology, has had some recent LE edema as well - will check echo - EKG today show NSR, no ischemic changes   3. LE edema - check echo, start lasix  20mg  prn  4. HLD - at goal, continue current meds   F/u 6 weeks with PA    Dorn PHEBE Ross, M.D.

## 2023-07-17 ENCOUNTER — Ambulatory Visit (HOSPITAL_COMMUNITY): Admitting: Licensed Clinical Social Worker

## 2023-07-17 DIAGNOSIS — F4310 Post-traumatic stress disorder, unspecified: Secondary | ICD-10-CM | POA: Diagnosis not present

## 2023-07-17 DIAGNOSIS — F330 Major depressive disorder, recurrent, mild: Secondary | ICD-10-CM

## 2023-07-17 DIAGNOSIS — Z630 Problems in relationship with spouse or partner: Secondary | ICD-10-CM | POA: Diagnosis not present
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
## 2023-07-31 ENCOUNTER — Other Ambulatory Visit: Payer: Self-pay

## 2023-08-04 ENCOUNTER — Ambulatory Visit: Attending: Cardiology

## 2023-08-04 DIAGNOSIS — R079 Chest pain, unspecified: Secondary | ICD-10-CM

## 2023-08-05 LAB — ECHOCARDIOGRAM COMPLETE
AR max vel: 2.47 cm2
AV Area VTI: 2.67 cm2
AV Area mean vel: 2.48 cm2
AV Mean grad: 4 mmHg
AV Peak grad: 7.8 mmHg
Ao pk vel: 1.4 m/s
Area-P 1/2: 3.17 cm2
Calc EF: 69.4 %
MV VTI: 2.19 cm2
S' Lateral: 2.7 cm
Single Plane A2C EF: 70.2 %
Single Plane A4C EF: 67 %

## 2023-08-06 DIAGNOSIS — M419 Scoliosis, unspecified: Secondary | ICD-10-CM | POA: Diagnosis not present

## 2023-08-06 DIAGNOSIS — Z6827 Body mass index (BMI) 27.0-27.9, adult: Secondary | ICD-10-CM | POA: Diagnosis not present

## 2023-08-11 ENCOUNTER — Other Ambulatory Visit (HOSPITAL_COMMUNITY): Payer: Self-pay

## 2023-08-11 DIAGNOSIS — M419 Scoliosis, unspecified: Secondary | ICD-10-CM

## 2023-08-14 ENCOUNTER — Other Ambulatory Visit: Payer: Self-pay | Admitting: Internal Medicine

## 2023-08-14 ENCOUNTER — Other Ambulatory Visit (HOSPITAL_COMMUNITY): Payer: Self-pay | Admitting: Psychiatry

## 2023-08-14 DIAGNOSIS — F330 Major depressive disorder, recurrent, mild: Secondary | ICD-10-CM

## 2023-08-14 DIAGNOSIS — F4310 Post-traumatic stress disorder, unspecified: Secondary | ICD-10-CM

## 2023-08-17 ENCOUNTER — Ambulatory Visit (HOSPITAL_COMMUNITY)
Admission: RE | Admit: 2023-08-17 | Discharge: 2023-08-17 | Disposition: A | Discharge status: Home / Self Care | Source: Ambulatory Visit

## 2023-08-17 DIAGNOSIS — M5021 Other cervical disc displacement,  high cervical region: Secondary | ICD-10-CM | POA: Diagnosis not present

## 2023-08-17 DIAGNOSIS — M5126 Other intervertebral disc displacement, lumbar region: Secondary | ICD-10-CM | POA: Diagnosis not present

## 2023-08-17 DIAGNOSIS — M419 Scoliosis, unspecified: Secondary | ICD-10-CM | POA: Insufficient documentation

## 2023-08-17 DIAGNOSIS — M47812 Spondylosis without myelopathy or radiculopathy, cervical region: Secondary | ICD-10-CM | POA: Diagnosis not present

## 2023-08-17 DIAGNOSIS — M47814 Spondylosis without myelopathy or radiculopathy, thoracic region: Secondary | ICD-10-CM | POA: Diagnosis not present

## 2023-08-17 DIAGNOSIS — M48061 Spinal stenosis, lumbar region without neurogenic claudication: Secondary | ICD-10-CM | POA: Diagnosis not present

## 2023-08-17 DIAGNOSIS — M5135 Other intervertebral disc degeneration, thoracolumbar region: Secondary | ICD-10-CM | POA: Diagnosis not present

## 2023-08-17 DIAGNOSIS — M50221 Other cervical disc displacement at C4-C5 level: Secondary | ICD-10-CM | POA: Diagnosis not present

## 2023-08-17 DIAGNOSIS — M4804 Spinal stenosis, thoracic region: Secondary | ICD-10-CM | POA: Diagnosis not present

## 2023-08-17 DIAGNOSIS — M549 Dorsalgia, unspecified: Secondary | ICD-10-CM | POA: Diagnosis not present

## 2023-08-17 DIAGNOSIS — M4805 Spinal stenosis, thoracolumbar region: Secondary | ICD-10-CM | POA: Diagnosis not present

## 2023-08-17 DIAGNOSIS — M4802 Spinal stenosis, cervical region: Secondary | ICD-10-CM | POA: Diagnosis not present

## 2023-08-21 DIAGNOSIS — M419 Scoliosis, unspecified: Secondary | ICD-10-CM | POA: Diagnosis not present

## 2023-08-21 DIAGNOSIS — F1190 Opioid use, unspecified, uncomplicated: Secondary | ICD-10-CM | POA: Diagnosis not present

## 2023-08-27 ENCOUNTER — Ambulatory Visit (HOSPITAL_COMMUNITY): Admitting: Licensed Clinical Social Worker

## 2023-08-27 DIAGNOSIS — Z630 Problems in relationship with spouse or partner: Secondary | ICD-10-CM | POA: Diagnosis not present

## 2023-08-27 DIAGNOSIS — F330 Major depressive disorder, recurrent, mild: Secondary | ICD-10-CM

## 2023-08-27 NOTE — Progress Notes (Signed)
 THERAPIST PROGRESS NOTE   Session Date: 08/27/2023  Session Time: 1105 - 1208  Participation Level: Active  Behavioral Response: CasualAlertEuthymic  Type of Therapy: Individual Therapy  Treatment Goals addressed:   Progressing (3) LTG: Reduce frequency, intensity, and duration of depression symptoms so that daily functioning is improved (OP Depression) STG: Khadeja Abt will reduce frequency of avoidant behaviors by 50% as evidenced by self-report in therapy sessions (Anxiety)  Not Progressing (1) LTG: Increase coping skills to manage depression and improve ability to perform daily activities (OP Depression)  Completed/Met (2) LTG: Glenyce Randle will score less than 5 on the Generalized Anxiety Disorder 7 Scale (GAD-7) (Anxiety) STG: Report a decrease in anxiety symptoms as evidenced by an overall reduction in anxiety score by a minimum of 25% on the Generalized Anxiety Disorder Scale (GAD-7) (Anxiety)  ProgressTowards Goals: Progressing  Interventions: CBT, Motivational Interviewing, and Supportive  Summary: Service is a 68 y.o. female with past psych history of anxiety, depression, and PTSD, presenting for follow-up therapy session in efforts to improve management of depressive and anxious symptoms.   Pt actively engaged in session, presenting in overall pleasant moods with congruent affect throughout duration of visit. Patient actively engaged in introductory check-in, sharing of things to be going well overall, however noting to be having challenges related to increased physical pain as of late, detailing additional observed complications surrounding physical inabilities, including decreased time able to spend on feet while cooking, and reduced attentiveness towards housework/household chores throughout duration of visit. Pt detailed recent MRIs with new neurologist in aims of further navigating potential appropriateness for surgery, and upcoming CT scans for further imaging of  regions of back. Pt engaged in reflection of previously identified areas of concern within relationship, noting of things having gone well over recent weeks with improved interactions, sharing of spouses concerns surrounding pt's lack of desire for intimacy, exploring potential contributing factors such as emotional stress, physical challenges, and depressed moods. Processed pt's continued interest in taking a trip and how pt believes this to potentially improve moods. Engaged in brief review of current tx goals outlined in individualized tx plan, progressions towards goals, and continued areas for work.  Patient responded well to interventions. Patient continues to meet criteria for Depression, Anxiety, and PTSD . Patient will continue to benefit from engagement in outpatient therapy due to being the least restrictive service to meet presenting needs.      06/18/2023    9:27 AM 05/15/2023    8:36 AM 02/28/2023   11:18 AM 02/04/2023   11:16 AM  GAD 7 : Generalized Anxiety Score  Nervous, Anxious, on Edge 1 1 1 1   Control/stop worrying 0 1 1 0  Worry too much - different things 1 1 0 0  Trouble relaxing 1 2 0 0  Restless 0 2 0 0  Easily annoyed or irritable 0 2 1 1   Afraid - awful might happen 1 1 0 0  Total GAD 7 Score 4 10 3 2   Anxiety Difficulty Not difficult at all Somewhat difficult Somewhat difficult Not difficult at all      06/18/2023    9:43 AM 05/15/2023    8:36 AM 02/28/2023   11:27 AM 02/04/2023   11:18 AM 01/09/2023   12:10 PM  Depression screen PHQ 2/9  Decreased Interest 0 2 1 0 2  Down, Depressed, Hopeless 0 2 2 1 2   PHQ - 2 Score 0 4 3 1 4   Altered sleeping 0 2 1 2  3  Tired, decreased energy 0 2 1 1 3   Change in appetite 0 2 1 1  0  Feeling bad or failure about yourself  0 2 1 0 1  Trouble concentrating 0 2 1 0 3  Moving slowly or fidgety/restless 0 2 1 0 0  Suicidal thoughts 0 0 1 0 0  PHQ-9 Score 0 16 10 5 14   Difficult doing work/chores   Somewhat difficult Not difficult  at all Somewhat difficult    Suicidal/Homicidal: No; pSI, no plans or intent.  Therapist Response: Clinician utilized CBT, MI, and supportive reflection techniques to address pt presenting sxs and identified challenges.   Clinician actively greeted pt upon presenting for visit, assessing presenting moods and affect. Openly engaged in introductory check-in, noting of pt's improved moods since previous visit, eliciting pt's recounts of events and factors contributing to improvements in moods, utilizing open ended questions to explore pt's thoughts and perspectives in relation to ongoing medical challenges and recurrent stressors within relationship. Actively listened to pt's reflections of recent events, providing support and validation for shared thoughts and feelings surrounding stress related to presenting medical concerns and relationship challenges and progressions. Utilized CBT, psychoed, and MI interventions to aid pt in processing thoughts and feelings surrounding stressors, impact stress has on individuals mental and emotional presentation, and encouraging pt to explore ways to incorporate and/or engage in enjoyable activities and day/long weekend trips to aid in improving moods.  Clinician reassessed severity of presenting sxs, and presence of any safety concerns. Therapist provided support and empathy to patient during session.  Plan: Return again in 4 weeks.  Diagnosis:  Encounter Diagnoses  Name Primary?   MDD (major depressive disorder), recurrent episode, mild (HCC) Yes   Marital stress       Collaboration of Care: Other None necessary at this time.  Patient/Guardian was advised Release of Information must be obtained prior to any record release in order to collaborate their care with an outside provider. Patient/Guardian was advised if they have not already done so to contact the registration department to sign all necessary forms in order for us  to release information  regarding their care.   Consent: Patient/Guardian gives verbal consent for treatment and assignment of benefits for services provided during this visit. Patient/Guardian expressed understanding and agreed to proceed.   [name], MSW, LCSW 08/27/2023,  12:02 PM

## 2023-08-31 ENCOUNTER — Ambulatory Visit (HOSPITAL_COMMUNITY)
Admission: RE | Admit: 2023-08-31 | Discharge: 2023-08-31 | Disposition: A | Discharge status: Home / Self Care | Source: Ambulatory Visit

## 2023-08-31 DIAGNOSIS — M419 Scoliosis, unspecified: Secondary | ICD-10-CM | POA: Diagnosis not present

## 2023-08-31 DIAGNOSIS — M438X6 Other specified deforming dorsopathies, lumbar region: Secondary | ICD-10-CM | POA: Diagnosis not present

## 2023-08-31 DIAGNOSIS — R911 Solitary pulmonary nodule: Secondary | ICD-10-CM | POA: Diagnosis not present

## 2023-08-31 DIAGNOSIS — M48061 Spinal stenosis, lumbar region without neurogenic claudication: Secondary | ICD-10-CM | POA: Diagnosis not present

## 2023-08-31 DIAGNOSIS — M4802 Spinal stenosis, cervical region: Secondary | ICD-10-CM | POA: Diagnosis not present

## 2023-09-04 ENCOUNTER — Ambulatory Visit: Attending: Nurse Practitioner | Admitting: Nurse Practitioner

## 2023-09-04 ENCOUNTER — Encounter: Payer: Self-pay | Admitting: Nurse Practitioner

## 2023-09-04 VITALS — BP 112/60 | HR 60 | Ht 66.0 in | Wt 167.0 lb

## 2023-09-04 DIAGNOSIS — I1A0 Resistant hypertension: Secondary | ICD-10-CM

## 2023-09-04 DIAGNOSIS — E785 Hyperlipidemia, unspecified: Secondary | ICD-10-CM

## 2023-09-04 DIAGNOSIS — G4733 Obstructive sleep apnea (adult) (pediatric): Secondary | ICD-10-CM

## 2023-09-04 NOTE — Progress Notes (Addendum)
 Cardiology Office Note:  .   Date:  09/04/2023 ID:  Erica Gallegos, DOB 04-Jan-1956, MRN [id number] PCP: Melvenia Manus BRAVO, MD  [facility] Providers Cardiologist:  Alvan Carrier, MD    History of Present Illness: Erica Gallegos is a 68 y.o. female with a PMH of bradycardia, resistant HTN, HLD, GERD, sleep apnea, and MDD, who presents today for 6 month follow-up.   I last saw this patient on January 15, 2022. Was doing well, compliant with CPAP.   Today she presents for follow-up. She states since I last saw her, she has had chronic back issues, now using front rolling walker. Does admit to [facility] and occasional forgetfulness, has noticed small bump under right axilla. Denies any chest pain, shortness of breath, palpitations, syncope, presyncope, dizziness, orthopnea, PND, swelling or significant weight changes, acute bleeding, or claudication.  Last seen by Dr. Alvan on July 15, 2023. She noted an isolated episode of chest pain that was unclear in etiology, also noticed some recent leg edema. Echo was arranged - see report below.  Started on Lasix  20 mg as needed.  She is here for 6-week follow-up.  She states she is doing well from a cardiac standpoint. Denies any cardiac complaints. Denise any recurrent chest pain. Denies any chest pain, shortness of breath, palpitations, syncope, presyncope, dizziness, orthopnea, PND, significant weight changes, acute bleeding, or claudication. Denies any recent leg edema.   SH: Retired Nutritional therapist   Workup reviewed:   EKG: EKG was not ordered today.   Echo 07/2023:  1. Left ventricular ejection fraction, by estimation, is 60 to 65%. The  left ventricle has normal function. The left ventricle has no regional  wall motion abnormalities. Left ventricular diastolic parameters are  indeterminate. Elevated left atrial  pressure. The average left ventricular global longitudinal strain is -19.5  %. The global longitudinal strain is  normal.   2. Right ventricular systolic function is normal. The right ventricular  size is normal. Tricuspid regurgitation signal is inadequate for assessing  PA pressure.   3. Left atrial size was mild to moderately dilated.   4. The mitral valve is normal in structure. No evidence of mitral valve  regurgitation. No evidence of mitral stenosis.   5. The tricuspid valve is abnormal.   6. The aortic valve is tricuspid. Aortic valve regurgitation is not  visualized. No aortic stenosis is present.   7. The inferior vena cava is normal in size with greater than 50%  respiratory variability, suggesting right atrial pressure of 3 mmHg.   Comparison(s): No prior Echocardiogram.     Physical Exam:   VS:  BP 112/60   Pulse 60   Ht 5' 6 (1.676 m)   Wt 167 lb (75.8 kg)   SpO2 97%   BMI 26.95 kg/m    Wt Readings from Last 3 Encounters:  09/04/23 167 lb (75.8 kg)  07/15/23 166 lb (75.3 kg)  06/20/23 153 lb (69.4 kg)    GEN: Well nourished, well developed in no acute distress NECK: No JVD; No carotid bruits CARDIAC: S1/S2, RRR, no murmurs, rubs, gallops RESPIRATORY:  Clear to auscultation without rales, wheezing or rhonchi  ABDOMEN: Soft, non-tender, non-distended EXTREMITIES:  No edema; No deformity   ASSESSMENT AND PLAN: .    Resistant hypertension Blood pressure stable. Discussed to monitor BP at home at least 2 hours after medications and sitting for 5-10 minutes.  Continue current medication regimen.  She will contact us  if her  BP remains elevated. Heart healthy diet and regular cardiovascular exercise encouraged.    2. HLD LDL 86 05/2023. She is currently at goal.  Continue current medication regimen. Heart healthy diet and regular cardiovascular exercise encouraged.    3. OSA on CPAP Compliant with CPAP usage. Continue to follow-up with Dr. Shellia.    Dispo: Follow-up with Dr. Dorn Ross or APP in 6 months or sooner if anything changes.   Signed, Almarie Crate, NP

## 2023-09-04 NOTE — Patient Instructions (Addendum)

## 2023-09-18 ENCOUNTER — Telehealth: Payer: Self-pay

## 2023-09-18 ENCOUNTER — Telehealth: Payer: Self-pay | Admitting: Internal Medicine

## 2023-09-18 NOTE — Telephone Encounter (Signed)
 Copied from CRM #[id number]. Topic: General - Other >> Sep 18, 2023  4:07 PM Kevelyn M wrote: Reason for CRM: Patient had an incidental on her lung. They are going to fax over results.

## 2023-09-19 ENCOUNTER — Telehealth: Payer: Self-pay

## 2023-09-19 NOTE — Telephone Encounter (Signed)
 LVM for patient to let us  know if she would like to come in to see Leita on Monday at 10 am or 3 pm

## 2023-09-19 NOTE — Telephone Encounter (Signed)
 Copied from CRM #[id number]. Topic: Clinical - Lab/Test Results >> Sep 19, 2023  8:57 AM Avram MATSU wrote: Reason for CRM: Patient would like to the results of her lung scan. Please advise [phone]

## 2023-09-22 ENCOUNTER — Ambulatory Visit: Admitting: Internal Medicine

## 2023-09-22 ENCOUNTER — Encounter: Payer: Self-pay | Admitting: Internal Medicine

## 2023-09-22 ENCOUNTER — Ambulatory Visit

## 2023-09-22 VITALS — BP 148/73 | HR 64 | Ht 66.0 in | Wt 165.0 lb

## 2023-09-22 VITALS — BP 142/91 | HR 113 | Ht 66.0 in | Wt 165.2 lb

## 2023-09-22 DIAGNOSIS — I10 Essential (primary) hypertension: Secondary | ICD-10-CM | POA: Diagnosis not present

## 2023-09-22 DIAGNOSIS — R911 Solitary pulmonary nodule: Secondary | ICD-10-CM

## 2023-09-22 MED ORDER — IRBESARTAN 300 MG PO TABS: 300.0000 mg | ORAL_TABLET | Freq: Every day | ORAL | 11 refills | Status: AC

## 2023-09-22 NOTE — Assessment & Plan Note (Addendum)
 Quit smoking early 2000s  - CT 08/31/23 T spine: 10 mm solid lung nodule in the left lower lobe. Per Fleischner society recommendations, consider CT, PET/CT, or tissue sampling at 3 months. - PET 09/22/2023 >>> ordered   She should either get a Super D chest CT @ 3 m and bx if getting bigger  or go ahead with a full body PET scan as this could turn out to be a met from a dx of unknown primary with easier tissue access  or just a totally cold isolated nodule meaning we could just f/u in 6 m, depending on which study(ies) her insurance will cover at this point  Discussed in detail all the  indications, usual  risks and alternatives  relative to the benefits with patient who agrees to proceed with w/u with PET scan now.

## 2023-09-22 NOTE — Progress Notes (Unsigned)
 Erica Gallegos, female    DOB: [birth year]    MRN: [id number]   Brief patient profile:  66   yowf  former [facility] pt quit smoking around 2000 referred to pulmonary clinic in [place]  09/22/2023  by Leita Longs  for SPN.    History of Present Illness  09/22/2023  Pulmonary/ 1st office eval/ Cordell Guercio / [place] [facility]  Chief Complaint  Patient presents with   Establish Care    Lung nods.  Dyspnea:  uses rollator / limited by back pain > doe  Cough: minimal assoc with pnds x 6-8  m on ACEi  Sleep: flat bed 2 pillows on cpap  SABA use: none  02: none     No obvious day to day or daytime pattern/variability or assoc excess/ purulent sputum or mucus plugs or hemoptysis or cp or chest tightness, subjective wheeze or   hb symptoms.    Also denies any obvious fluctuation of symptoms with weather or environmental changes or other aggravating or alleviating factors except as outlined above   No unusual exposure hx or h/o childhood pna/ asthma or knowledge of premature birth.  Current Allergies, Complete Past Medical History, Past Surgical History, Family History, and Social History were reviewed in [employer] record.  ROS  The following are not active complaints unless bolded Hoarseness, sore throat/globus sensation , dysphagia, dental problems, itching, sneezing,  nasal congestion or sense of discharge of excess mucus or purulent secretions, ear ache,   fever, chills, sweats, unintended wt loss or wt gain, classically pleuritic or exertional cp,  orthopnea pnd or arm/hand swelling  or leg swelling, presyncope, palpitations, abdominal pain, anorexia, nausea, vomiting, diarrhea  or change in bowel habits or change in bladder habits, change in stools or change in urine, dysuria, hematuria,  rash, arthralgias, visual complaints, headache, numbness, weakness or ataxia or problems with walking or coordination,  change in mood or  memory.            Outpatient  Medications Prior to Visit  Medication Sig Dispense Refill   acetaminophen  (TYLENOL ) 325 MG tablet Take 650 mg by mouth every 6 (six) hours as needed.     atorvastatin  (LIPITOR) 40 MG tablet Take 1 tablet (40 mg total) by mouth daily. 90 tablet 3   buPROPion  (WELLBUTRIN  XL) 300 MG 24 hr tablet Take 1 tablet (300 mg total) by mouth every morning. 30 tablet 2   carvedilol  (COREG ) 12.5 MG tablet TAKE 1 TABLET(12.5 MG) BY MOUTH TWICE DAILY 180 tablet 3   chlorthalidone  (HYGROTON ) 25 MG tablet TAKE 1 AND 1/2 TABLETS(37.5 MG) BY MOUTH DAILY 135 tablet 3   Cholecalciferol  (VITAMIN D3) 50 MCG (2000 UT) TABS Take 2,000 Units by mouth daily.     cyclobenzaprine  (FLEXERIL ) 10 MG tablet TAKE 1 TABLET BY MOUTH EVERY AT BEDTIME AS NEEDED FOR BACK OR NERVE PAIN 30 tablet 3   DULoxetine  (CYMBALTA ) 60 MG capsule TAKE 1 CAPSULE(60 MG) BY MOUTH TWICE DAILY 90 capsule 1   esomeprazole  (NEXIUM ) 20 MG capsule Take 1 capsule (20 mg total) by mouth daily at 12 noon. 90 capsule 1   ezetimibe  (ZETIA ) 10 MG tablet Take 1 tablet (10 mg total) by mouth daily. 90 tablet 3   furosemide  (LASIX ) 20 MG tablet Take 1 tablet (20 mg total) by mouth as needed (swelling). 30 tablet 3   gabapentin  (NEURONTIN ) 300 MG capsule TAKE 1 CAPSULE BY MOUTH EVERY MORNING AND EVERY EVENING THEN TAKE 2 CAPSULES BY MOUTH  AT BEDTIME 360 capsule 0   hydrALAZINE  (APRESOLINE ) 100 MG tablet Take 1 tablet (100 mg total) by mouth 2 (two) times daily. 180 tablet 0   lisinopril  (ZESTRIL ) 40 MG tablet Take 1 tablet (40 mg total) by mouth daily. 90 tablet 1   Melatonin 10 MG TABS Take 10 mg by mouth at bedtime.     Omega-3 Fatty Acids (FISH OIL) 1000 MG CAPS Take 4,000 mg by mouth daily.     oxyCODONE-acetaminophen  (PERCOCET/ROXICET) 5-325 MG tablet Take 1 tablet by mouth every 6 (six) hours as needed.     QUEtiapine  (SEROQUEL ) 50 MG tablet Take 1 tablet (50 mg total) by mouth 2 (two) times daily. 180 tablet 3   spironolactone  (ALDACTONE ) 25 MG tablet TAKE 1  TABLET(25 MG) BY MOUTH DAILY 90 tablet 1   vitamin B-12 (CYANOCOBALAMIN ) 500 MCG tablet Take 500 mcg by mouth daily.     vitamin C (ASCORBIC ACID) 500 MG tablet Take 500 mg by mouth daily.     clonazePAM  (KLONOPIN ) 0.5 MG tablet Take 1 tablet (0.5 mg total) by mouth at bedtime. (Patient not taking: Reported on 09/22/2023) 30 tablet 2   hydrOXYzine  (VISTARIL ) 25 MG capsule Take 1 capsule (25 mg total) by mouth every 8 (eight) hours as needed for itching. (Patient not taking: Reported on 09/22/2023) 30 capsule 0   No facility-administered medications prior to visit.    Past Medical History:  Diagnosis Date   Allergy    Anxiety    Arthritis    Cataract    Removed/both eyes   Chronic kidney disease    Small cyst   Complication of anesthesia    Patient woke up during anesthesia with bunionectomy   GERD (gastroesophageal reflux disease)    HLD (hyperlipidemia)    HTN (hypertension)    Major depressive disorder    Migraine with aura    Neuromuscular disorder (HCC) 2018/approximately   Spinal stenosis   PONV (postoperative nausea and vomiting)    RAS (renal artery stenosis) (HCC)    Scoliosis    Sleep apnea    Sleep related hypoxia    Spinal stenosis       Objective:     BP (!) 142/91   Pulse (!) 113   Ht 5' 6 (1.676 m)   Wt 165 lb 3.2 oz (74.9 kg)   SpO2 97% Comment: ra  BMI 26.66 kg/m   SpO2: 97 % (ra) amb wf  freq throat clearing    HEENT : Oropharynx  clear      Nasal turbinates nl    NECK :  without  apparent JVD/ palpable Nodes/TM    LUNGS: no acc muscle use, mildly scoliotic  contour chest which is clear to A and P bilaterally without cough on insp or exp maneuvers   CV:  RRR  no s3 or murmur or increase in P2, and no edema   ABD:  soft and nontender   MS:  Gait slow ace,    ext warm without deformities Or obvious joint restrictions  calf tenderness, cyanosis or clubbing    SKIN: warm and dry without lesions    NEURO:  alert, approp, nl sensorium with   no motor or cerebellar deficits apparent.          Assessment   Assessment & Plan Solitary pulmonary nodule on lung CT Quit smoking early 2000s  - CT 08/31/23 T spine: 10 mm solid lung nodule in the left lower lobe. Per [employer] recommendations, consider  CT, PET/CT, or tissue sampling at 3 months. - PET 09/22/2023 >>> ordered   She should either get a Super D chest CT @ 3 m and bx if getting bigger  or go ahead with a full body PET scan as this could turn out to be a met from a dx of unknown primary with easier tissue access  or just a totally cold isolated nodule meaning we could just f/u in 6 m, depending on which study(ies) her insurance will cover at this point  Discussed in detail all the  indications, usual  risks and alternatives  relative to the benefits with patient who agrees to proceed with w/u with PET scan now.    Essential hypertension Change lisinopril  to olmesartan  due to upper airway cough 09/22/2023 >>>   ACEi adverse effects at the  top of the usual list of suspects and the only way to rule it out is a trial off > try ibesartan 300 mg one daily, can break if half if too strong and f/u with PCP for fine turning   Discussed in detail all the  indications, usual  risks and alternatives  relative to the benefits with patient who agrees to proceed with Rx as outlined.         Each maintenance medication was reviewed in detail including emphasizing most importantly the difference between maintenance and prns and under what circumstances the prns are to be triggered using an action plan format where appropriate.  Total time for H and P, chart review, counseling,  and generating customized AVS unique to this office visit / same day charting = 40 min with pt new to me but not to the practice          AVS  Patient Instructions  My office will be contacting you by phone for referral to [facility] for PET scan   - if you don't hear back from my office within one week  please call us  back or notify us  thru MyChart and we'll address it right away.  - I will call the same day I get your PET report back but it may take a week or so  Stop lisinopril  and start ibesartan 300 mg one daily - ok to break in half if too strong and call me with any dosing problems until you see your PCP who can take over.   Ozell America, MD 09/22/2023

## 2023-09-22 NOTE — Assessment & Plan Note (Addendum)
 Change lisinopril  to olmesartan  due to upper airway cough 09/22/2023 >>>   ACEi adverse effects at the  top of the usual list of suspects and the only way to rule it out is a trial off > try ibesartan 300 mg one daily, can break if half if too strong and f/u with PCP for fine turning   Discussed in detail all the  indications, usual  risks and alternatives  relative to the benefits with patient who agrees to proceed with Rx as outlined.         Each maintenance medication was reviewed in detail including emphasizing most importantly the difference between maintenance and prns and under what circumstances the prns are to be triggered using an action plan format where appropriate.  Total time for H and P, chart review, counseling,  and generating customized AVS unique to this office visit / same day charting = 40 min with pt new to me but not to the practice

## 2023-09-22 NOTE — Progress Notes (Unsigned)
 Established Patient Office Visit  Subjective   Patient ID: Erica Gallegos, female    DOB: 1955-01-25  Age: 68 y.o. MRN: [id number]  Chief Complaint  Patient presents with   imaging    Follow up from recent imaging results     HPI Discussed the use of AI scribe software for clinical note transcription with the patient, who gave verbal consent to proceed.  History of Present Illness   Erica Gallegos is a 68 year old female who presents with a 10 mm lung nodule found incidentally on a CT scan.  Pulmonary nodule - 10 mm solid nodule identified in the left lower lobe on CT scan - Nodule discovered incidentally during imaging ordered for unrelated neurological issues - Approximately two-week delay in receiving CT results - No recent follow-up with pulmonology  Tobacco use history - History of smoking, quit approximately 20 years ago  Obstructive sleep apnea - Uses CPAP machine for management  Imaging and contrast tolerance - No known allergies to contrast dyes - Previously tolerated IV contrast without issues - No recent CT scans other than the one identifying the nodule - History of lumbar spine MRIs      Patient Active Problem List   Diagnosis Date Noted   Solitary pulmonary nodule on lung CT 09/22/2023   Chronic kidney disease, stage 3b (HCC) 05/15/2023   Itching 12/26/2022   PTSD (post-traumatic stress disorder) 11/12/2022   Abnormal thyroid  blood test 06/27/2022   Hypercalcemia 06/27/2022   Need for pneumococcal 20-valent conjugate vaccination 06/18/2022   Anxiety and depression 02/26/2022   OSA on CPAP 02/26/2022   Ocular pain, right eye 02/26/2022   Need for influenza vaccination 10/23/2021   Need for shingles vaccine 10/23/2021   Memory problem 08/21/2021   Osteoporosis 07/24/2021   Vitamin D  deficiency 07/24/2021   Neuropathy 03/22/2021   Hyperlipidemia 03/22/2021   Spinal stenosis 03/22/2021   Fall 03/22/2021   Right shoulder pain  03/22/2021   Left knee pain 03/22/2021   Encounter for screening fecal occult blood testing 08/28/2020   Encounter for gynecological examination with Papanicolaou smear of cervix 08/28/2020   Dysphagia 03/02/2020   Essential hypertension 03/02/2020   Nausea without vomiting 03/02/2020   Gastroesophageal reflux disease 03/02/2020    ROS    Objective:     BP (!) 148/73   Pulse 64   Ht 5' 6 (1.676 m)   Wt 165 lb (74.8 kg)   SpO2 93%   BMI 26.63 kg/m  BP Readings from Last 3 Encounters:  09/22/23 (!) 142/91  09/22/23 (!) 148/73  09/04/23 112/60   Wt Readings from Last 3 Encounters:  09/22/23 165 lb 3.2 oz (74.9 kg)  09/22/23 165 lb (74.8 kg)  09/04/23 167 lb (75.8 kg)     Physical Exam Vitals and nursing note reviewed.  Constitutional:      Appearance: Normal appearance.  HENT:     Head: Normocephalic.     Right Ear: Tympanic membrane, ear canal and external ear normal.     Left Ear: Tympanic membrane, ear canal and external ear normal.     Nose: Nose normal.     Mouth/Throat:     Mouth: Mucous membranes are moist.     Pharynx: Oropharynx is clear.  Cardiovascular:     Rate and Rhythm: Normal rate and regular rhythm.  Pulmonary:     Effort: Pulmonary effort is normal.     Breath sounds: Normal breath sounds.  Musculoskeletal:  Cervical back: Normal range of motion and neck supple.  Skin:    General: Skin is warm and dry.  Neurological:     Mental Status: She is alert and oriented to person, place, and time.  Psychiatric:        Mood and Affect: Mood normal.        Thought Content: Thought content normal.      No results found for any visits on 09/22/23.  Last CBC Lab Results  Component Value Date   WBC 6.6 05/15/2023   HGB 13.0 05/15/2023   HCT 40.5 05/15/2023   MCV 96 05/15/2023   MCH 30.9 05/15/2023   RDW 13.3 05/15/2023   PLT 223 05/15/2023   Last metabolic panel Lab Results  Component Value Date   GLUCOSE 94 05/15/2023   NA 142  05/15/2023   K 4.6 05/15/2023   CL 104 05/15/2023   CO2 21 05/15/2023   BUN 19 05/15/2023   CREATININE 1.42 (H) 05/15/2023   EGFR 41 (L) 05/15/2023   CALCIUM  10.4 (H) 05/15/2023   PHOS 3.0 04/10/2023   PROT 6.8 05/15/2023   ALBUMIN 4.4 05/15/2023   LABGLOB 2.4 05/15/2023   AGRATIO 2.0 06/20/2022   BILITOT 0.6 05/15/2023   ALKPHOS 82 05/15/2023   AST 35 05/15/2023   ALT 37 (H) 05/15/2023   ANIONGAP 3 (L) 12/04/2021   Last lipids Lab Results  Component Value Date   CHOL 160 05/15/2023   HDL 54 05/15/2023   LDLCALC 86 05/15/2023   TRIG 111 05/15/2023   CHOLHDL 3.0 05/15/2023   Last hemoglobin A1c Lab Results  Component Value Date   HGBA1C 5.3 05/15/2023   Last thyroid  functions Lab Results  Component Value Date   TSH 2.440 04/10/2023   THYROIDAB <9 06/20/2022   Last vitamin D  Lab Results  Component Value Date   VD25OH 44.7 04/10/2023   Last vitamin B12 and Folate Lab Results  Component Value Date   VITAMINB12 1,137 05/15/2023   FOLATE 11.3 05/15/2023      The 10-year ASCVD risk score (Arnett DK, et al., 2019) is: 9.2%    Assessment & Plan:   Problem List Items Addressed This Visit       Other   Solitary pulmonary nodule on lung CT - Primary   Solitary pulmonary nodule, left lower lobe 10 mm solid nodule in left lower lobe. Differential includes benign versus malignant. Former smoker status considered in risk assessment. Pulmonologist advised against PET scan. - Refer to pulmonologist for further evaluation.       Relevant Orders   Ambulatory referral to Pulmonology        No follow-ups on file.    Leita Longs, FNP

## 2023-09-22 NOTE — Patient Instructions (Signed)
 My office will be contacting you by phone for referral to [facility] for PET scan   - if you don't hear back from my office within one week please call us  back or notify us  thru MyChart and we'll address it right away.  - I will call the same day I get your PET report back but it may take a week or so  Stop lisinopril  and start ibesartan 300 mg one daily - ok to break in half if too strong and call me with any dosing problems until you see your PCP who can take over.

## 2023-09-23 ENCOUNTER — Telehealth: Payer: Self-pay | Admitting: Internal Medicine

## 2023-09-23 NOTE — Telephone Encounter (Signed)
 Spoke with patent regarding the Thursday 10/02/23 11:30 am PET scan appointment at [facility] time is 11:15 am--1st floor registration desk for check in---NPO 6 hours prior to study---can have water .  Will mail information to patient and she voiced her understanding

## 2023-09-24 ENCOUNTER — Ambulatory Visit (HOSPITAL_COMMUNITY)

## 2023-09-24 ENCOUNTER — Ambulatory Visit (HOSPITAL_COMMUNITY): Admitting: Licensed Clinical Social Worker

## 2023-09-24 DIAGNOSIS — F419 Anxiety disorder, unspecified: Secondary | ICD-10-CM | POA: Diagnosis not present

## 2023-09-24 DIAGNOSIS — Z630 Problems in relationship with spouse or partner: Secondary | ICD-10-CM

## 2023-09-24 DIAGNOSIS — F4310 Post-traumatic stress disorder, unspecified: Secondary | ICD-10-CM | POA: Diagnosis not present

## 2023-09-24 DIAGNOSIS — F330 Major depressive disorder, recurrent, mild: Secondary | ICD-10-CM

## 2023-09-24 NOTE — Assessment & Plan Note (Signed)
 Solitary pulmonary nodule, left lower lobe 10 mm solid nodule in left lower lobe. Differential includes benign versus malignant. Former smoker status considered in risk assessment. Pulmonologist advised against PET scan. - Refer to pulmonologist for further evaluation.

## 2023-09-24 NOTE — Progress Notes (Unsigned)
 THERAPIST PROGRESS NOTE   Session Date: 09/24/2023  Session Time: 1118 - 1218  Participation Level: Active  Behavioral Response: CasualAlertEuthymic  Type of Therapy: Individual Therapy  Treatment Goals addressed:   Progressing (3) LTG: Reduce frequency, intensity, and duration of depression symptoms so that daily functioning is improved (OP Depression) STG: Erica Gallegos will reduce frequency of avoidant behaviors by 50% as evidenced by self-report in therapy sessions (Anxiety)  Not Progressing (1) LTG: Increase coping skills to manage depression and improve ability to perform daily activities (OP Depression)  Completed/Met (2) LTG: Erica Gallegos will score less than 5 on the Generalized Anxiety Disorder 7 Scale (GAD-7) (Anxiety) STG: Report a decrease in anxiety symptoms as evidenced by an overall reduction in anxiety score by a minimum of 25% on the Generalized Anxiety Disorder Scale (GAD-7) (Anxiety)  ProgressTowards Goals: Progressing  Interventions: CBT, Motivational Interviewing, and Supportive  Summary: Service is a 68 y.o. female with past psych history of anxiety, depression, and PTSD, presenting for follow-up therapy session in efforts to improve management of depressive and anxious symptoms.   Pt actively engaged in session, presenting in overall pleasant moods with congruent affect throughout duration of visit. Patient actively engaged in introductory check-in, ***       sharing of things to be going well overall, however noting to be having challenges related to increased physical pain as of late, detailing additional observed complications surrounding physical inabilities, including decreased time able to spend on feet while cooking, and reduced attentiveness towards housework/household chores throughout duration of visit. Pt detailed recent MRIs with new neurologist in aims of further navigating potential appropriateness for surgery, and upcoming CT scans for  further imaging of regions of back. Pt engaged in reflection of previously identified areas of concern within relationship, noting of things having gone well over recent weeks with improved interactions, sharing of spouses concerns surrounding pt's lack of desire for intimacy, exploring potential contributing factors such as emotional stress, physical challenges, and depressed moods. Processed pt's continued interest in taking a trip and how pt believes this to potentially improve moods. Engaged in brief review of current tx goals outlined in individualized tx plan, progressions towards goals, and continued areas for work.  Patient responded well to interventions. Patient continues to meet criteria for Depression, Anxiety, and PTSD . Patient will continue to benefit from engagement in outpatient therapy due to being the least restrictive service to meet presenting needs.      09/22/2023   10:00 AM 06/18/2023    9:27 AM 05/15/2023    8:36 AM 02/28/2023   11:18 AM  GAD 7 : Generalized Anxiety Score  Nervous, Anxious, on Edge 0 1 1 1   Control/stop worrying 0 0 1 1  Worry too much - different things 0 1 1 0  Trouble relaxing 0 1 2 0  Restless 0 0 2 0  Easily annoyed or irritable 0 0 2 1  Afraid - awful might happen 0 1 1 0  Total GAD 7 Score 0 4 10 3   Anxiety Difficulty Not difficult at all Not difficult at all Somewhat difficult Somewhat difficult      09/22/2023   10:01 AM 06/18/2023    9:43 AM 05/15/2023    8:36 AM 02/28/2023   11:27 AM 02/04/2023   11:18 AM  Depression screen PHQ 2/9  Decreased Interest 0 0 2 1 0  Down, Depressed, Hopeless 0 0 2 2 1   PHQ - 2 Score 0 0 4 3 1   Altered  sleeping 0 0 2 1 2   Tired, decreased energy 0 0 2 1 1   Change in appetite 0 0 2 1 1   Feeling bad or failure about yourself  0 0 2 1 0  Trouble concentrating 0 0 2 1 0  Moving slowly or fidgety/restless 0 0 2 1 0  Suicidal thoughts 0 0 0 1 0  PHQ-9 Score 0 0 16 10 5   Difficult doing work/chores Not difficult  at all   Somewhat difficult Not difficult at all    Suicidal/Homicidal: No; pSI, no plans or intent.  Therapist Response: Clinician utilized CBT, MI, and supportive reflection techniques to address pt presenting sxs and identified challenges.   Clinician actively greeted pt upon presenting for visit, assessing presenting moods and affect. *** Openly engaged in introductory check-in, noting of pt's improved moods since previous visit, eliciting pt's recounts of events and factors contributing to improvements in moods, utilizing open ended questions to explore pt's thoughts and perspectives in relation to ongoing medical challenges and recurrent stressors within relationship. Actively listened to pt's reflections of recent events, providing support and validation for shared thoughts and feelings surrounding stress related to presenting medical concerns and relationship challenges and progressions. Utilized CBT, psychoed, and MI interventions to aid pt in processing thoughts and feelings surrounding stressors, impact stress has on individuals mental and emotional presentation, and encouraging pt to explore ways to incorporate and/or engage in enjoyable activities and day/long weekend trips to aid in improving moods.  Clinician reassessed severity of presenting sxs, and presence of any safety concerns. Therapist provided support and empathy to patient during session.  Plan: Return again in 4 weeks.  Diagnosis:  Encounter Diagnoses  Name Primary?   MDD (major depressive disorder), recurrent episode, mild (HCC) Yes   Marital stress    PTSD (post-traumatic stress disorder)    Anxiety       Collaboration of Care: Other None necessary at this time.  Patient/Guardian was advised Release of Information must be obtained prior to any record release in order to collaborate their care with an outside provider. Patient/Guardian was advised if they have not already done so to contact the registration  department to sign all necessary forms in order for us  to release information regarding their care.   Consent: Patient/Guardian gives verbal consent for treatment and assignment of benefits for services provided during this visit. Patient/Guardian expressed understanding and agreed to proceed.   [name], MSW, LCSW 09/24/2023,  11:20 AM

## 2023-10-02 ENCOUNTER — Encounter (HOSPITAL_COMMUNITY)
Admission: RE | Admit: 2023-10-02 | Discharge: 2023-10-02 | Disposition: A | Discharge status: Home / Self Care | Source: Ambulatory Visit | Attending: Internal Medicine | Admitting: Internal Medicine

## 2023-10-02 ENCOUNTER — Ambulatory Visit: Payer: Self-pay | Admitting: Cardiology

## 2023-10-02 DIAGNOSIS — R911 Solitary pulmonary nodule: Secondary | ICD-10-CM | POA: Insufficient documentation

## 2023-10-02 MED ORDER — FLUDEOXYGLUCOSE F - 18 (FDG) INJECTION
8.5800 | Freq: Once | INTRAVENOUS | Status: AC | PRN
  Administered 2023-10-02: 8.58 via INTRAVENOUS

## 2023-10-06 ENCOUNTER — Ambulatory Visit: Payer: Self-pay | Admitting: Internal Medicine

## 2023-10-06 DIAGNOSIS — M48062 Spinal stenosis, lumbar region with neurogenic claudication: Secondary | ICD-10-CM | POA: Diagnosis not present

## 2023-10-06 DIAGNOSIS — R911 Solitary pulmonary nodule: Secondary | ICD-10-CM

## 2023-10-06 NOTE — Progress Notes (Signed)
 Called and relayed results to pt - ct w contrast ordered

## 2023-10-08 DIAGNOSIS — M48062 Spinal stenosis, lumbar region with neurogenic claudication: Secondary | ICD-10-CM | POA: Diagnosis not present

## 2023-10-08 DIAGNOSIS — Z6826 Body mass index (BMI) 26.0-26.9, adult: Secondary | ICD-10-CM | POA: Diagnosis not present

## 2023-10-15 ENCOUNTER — Telehealth: Payer: Self-pay | Admitting: Cardiology

## 2023-10-15 NOTE — Telephone Encounter (Signed)
  Patient would like to discuss her CPAP with Dr Alvan. She states she is going to need some supplies. Please call.

## 2023-10-15 NOTE — Telephone Encounter (Signed)
 Patient followed by Dr.Wert and will contact him for mask supplies

## 2023-10-16 ENCOUNTER — Ambulatory Visit (HOSPITAL_COMMUNITY): Admitting: Psychiatry

## 2023-10-16 ENCOUNTER — Other Ambulatory Visit: Payer: Self-pay

## 2023-10-16 ENCOUNTER — Encounter (HOSPITAL_COMMUNITY): Payer: Self-pay | Admitting: Psychiatry

## 2023-10-16 VITALS — BP 153/80 | HR 67 | Ht 66.0 in | Wt 167.0 lb

## 2023-10-16 DIAGNOSIS — Z630 Problems in relationship with spouse or partner: Secondary | ICD-10-CM | POA: Diagnosis not present

## 2023-10-16 DIAGNOSIS — F4310 Post-traumatic stress disorder, unspecified: Secondary | ICD-10-CM | POA: Diagnosis not present

## 2023-10-16 DIAGNOSIS — F330 Major depressive disorder, recurrent, mild: Secondary | ICD-10-CM | POA: Diagnosis not present

## 2023-10-16 DIAGNOSIS — F419 Anxiety disorder, unspecified: Secondary | ICD-10-CM

## 2023-10-16 MED ORDER — QUETIAPINE FUMARATE 50 MG PO TABS: ORAL_TABLET | ORAL | 1 refills | Status: DC

## 2023-10-16 MED ORDER — BUPROPION HCL ER (XL) 300 MG PO TB24: 300.0000 mg | ORAL_TABLET | ORAL | 1 refills | Status: DC

## 2023-10-16 MED ORDER — DULOXETINE HCL 30 MG PO CPEP: 90.0000 mg | ORAL_CAPSULE | Freq: Every day | ORAL | 1 refills | Status: DC
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
## 2023-10-20 ENCOUNTER — Other Ambulatory Visit (HOSPITAL_COMMUNITY): Payer: Self-pay | Admitting: *Deleted

## 2023-10-20 DIAGNOSIS — F419 Anxiety disorder, unspecified: Secondary | ICD-10-CM

## 2023-10-20 DIAGNOSIS — F330 Major depressive disorder, recurrent, mild: Secondary | ICD-10-CM

## 2023-10-20 DIAGNOSIS — F4310 Post-traumatic stress disorder, unspecified: Secondary | ICD-10-CM

## 2023-10-20 MED ORDER — QUETIAPINE FUMARATE 50 MG PO TABS
ORAL_TABLET | ORAL | 1 refills | Status: DC
Start: 2023-10-20 — End: 2023-11-17

## 2023-10-21 NOTE — Therapy (Signed)
 OUTPATIENT PHYSICAL THERAPY THORACOLUMBAR EVALUATION   Patient Name: Erica Gallegos MRN: [id number] DOB:Aug 06, 1955, 68 y.o., female Today's Date: 10/22/2023  END OF SESSION:  PT End of Session - 10/22/23 0900     Visit Number 1    Number of Visits 8    Date for Recertification  11/19/23    Authorization Type BCBS Medicare    Authorization Time Period no auth needed    PT Start Time 0900    PT Stop Time 0940    PT Time Calculation (min) 40 min    Activity Tolerance Patient tolerated treatment well    Behavior During Therapy WFL for tasks assessed/performed          Past Medical History:  Diagnosis Date   Allergy    Anxiety    Arthritis    Cataract    Removed/both eyes   Chronic kidney disease    Small cyst   Complication of anesthesia    Patient woke up during anesthesia with bunionectomy   GERD (gastroesophageal reflux disease)    HLD (hyperlipidemia)    HTN (hypertension)    Major depressive disorder    Migraine with aura    Neuromuscular disorder (HCC) 2018/approximately   Spinal stenosis   PONV (postoperative nausea and vomiting)    RAS (renal artery stenosis)    Scoliosis    Sleep apnea    Sleep related hypoxia    Spinal stenosis    Past Surgical History:  Procedure Laterality Date   BUNIONECTOMY     CATARACT EXTRACTION     CHOLECYSTECTOMY     COLONOSCOPY WITH PROPOFOL  N/A 12/13/2021   Procedure: COLONOSCOPY WITH PROPOFOL ;  Surgeon: Shaaron Lamar CHRISTELLA, MD;  [facility];  Service: Endoscopy;  Laterality: N/A;  8:00am, asa 2   ESOPHAGOGASTRODUODENOSCOPY (EGD) WITH PROPOFOL  N/A 04/05/2021   Procedure: ESOPHAGOGASTRODUODENOSCOPY (EGD) WITH PROPOFOL ;  Surgeon: Shaaron Lamar CHRISTELLA, MD;  [facility];  Service: Endoscopy;  Laterality: N/A;  10:15am   ESOPHAGOGASTRODUODENOSCOPY (EGD) WITH PROPOFOL  N/A 12/13/2021   Procedure: ESOPHAGOGASTRODUODENOSCOPY (EGD) WITH PROPOFOL ;  Surgeon: Shaaron Lamar CHRISTELLA, MD;  [facility];  Service:  Endoscopy;  Laterality: N/A;   EYE SURGERY     MALONEY DILATION N/A 04/05/2021   Procedure: MALONEY DILATION;  Surgeon: Shaaron Lamar CHRISTELLA, MD;  [facility];  Service: Endoscopy;  Laterality: N/A;   MALONEY DILATION N/A 12/13/2021   Procedure: AGAPITO DILATION;  Surgeon: Shaaron Lamar CHRISTELLA, MD;  [facility];  Service: Endoscopy;  Laterality: N/A;   TUBAL LIGATION  1987   Patient Active Problem List   Diagnosis Date Noted   Solitary pulmonary nodule on lung CT 09/22/2023   Chronic kidney disease, stage 3b (HCC) 05/15/2023   Itching 12/26/2022   PTSD (post-traumatic stress disorder) 11/12/2022   Abnormal thyroid  blood test 06/27/2022   Hypercalcemia 06/27/2022   Need for pneumococcal 20-valent conjugate vaccination 06/18/2022   Anxiety and depression 02/26/2022   OSA on CPAP 02/26/2022   Ocular pain, right eye 02/26/2022   Need for influenza vaccination 10/23/2021   Need for shingles vaccine 10/23/2021   Memory problem 08/21/2021   Osteoporosis 07/24/2021   Vitamin D  deficiency 07/24/2021   Neuropathy 03/22/2021   Hyperlipidemia 03/22/2021   Spinal stenosis 03/22/2021   Fall 03/22/2021   Right shoulder pain 03/22/2021   Left knee pain 03/22/2021   Encounter for screening fecal occult blood testing 08/28/2020   Encounter for gynecological examination with Papanicolaou smear of cervix 08/28/2020  Dysphagia 03/02/2020   Essential hypertension 03/02/2020   Nausea without vomiting 03/02/2020   Gastroesophageal reflux disease 03/02/2020    PCP: Melvenia Mungo, MD  REFERRING PROVIDER: Darnella Dorn SAUNDERS, MD  REFERRING DIAG: M41.9 (ICD-10-CM) - Scoliosis, unspecified  Rationale for Evaluation and Treatment: Rehabilitation  THERAPY DIAG:  Other low back pain  Difficulty in walking, not elsewhere classified  Abnormal posture  Other abnormalities of gait and mobility  ONSET DATE: back pain for years  SUBJECTIVE:                                                                                                                                                                                            SUBJECTIVE STATEMENT: Arrives with rollater; I need balance too; referred for scoliosis and also has spinal stenosis; takes reclast  for osteoporosis.  My goal is to have surgery done.  Originally injured her back in the 91's.  Worst pain in the last 3 years; was originally seeing Dr. Carollee.  Now seeing Dr. Darnella who referred for therapy.  If she has surgery will be at [facility] or [facility]; has had to start using a rollator over the past year.  MD told me it (therapy) would not help  PERTINENT HISTORY:  Anxiety, HTN, migraine, osteoporosis, PTSD; spinal stenosis  PAIN:  Are you having pain? Yes: NPRS scale: 3/10; 3/10 at best; 10/10 at worst Pain location: low back and but now up to right shoulder and arm, right leg can give out Pain description: aching sore constant but can worsen with activity Aggravating factors: standing and walking, turning over in bed Relieving factors: sit, rest  PRECAUTIONS: Fall  RED FLAGS: None   WEIGHT BEARING RESTRICTIONS: No  FALLS:  Has patient fallen in last 6 months? Yes. Number of falls 1  LIVING ENVIRONMENT: Lives with: lives with their spouse Lives in: House/apartment Stairs: No ramp entry Has following equipment at home: Vannie - 4 wheeled  OCCUPATION: retired  PLOF: Independent with household mobility with device and Independent with community mobility with device  PATIENT GOALS: to have surgery done  NEXT MD VISIT: PRN  OBJECTIVE:  Note: Objective measures were completed at Evaluation unless otherwise noted.  DIAGNOSTIC FINDINGS:  DEGENERATIVE CHANGES: Moderate disc height loss at C5-6 and C6-7. Small central disc-osteophyte complex at C6-7. Unchanged severe right neural foraminal narrowing at C6-7. Unchanged moderate right and severe left neural foraminal narrowing at C5-6 due to facet  arthropathy and uncovertebral joint spurring.I  IMPRESSION: 1. Severe spinal canal stenosis at L1-2, L2-3, and L4-5 levels. 2. Severe right neural foraminal narrowing at L2-3 and severe bilateral neural foraminal narrowing  at L4-5. 3. Levoscoliotic curvature of the lumbar spine with 7 mm left lateral listhesis of L3 on L4 and 10 mm right lateral listhesis of L1 on L2. 4. No significant spinal canal stenosis in the cervical or thoracic spine. 5. Moderate right and severe left neural foraminal narrowing at C5-6. 6. Severe right neural foraminal narrowing at C6-7. 7. 10 mm solid lung nodule in the left lower lobe. Per Fleischner society recommendations, consider CT, PET/CT, or tissue sampling at 3 months.  PATIENT SURVEYS:  Modified Oswestry:  MODIFIED OSWESTRY DISABILITY SCALE  Date: 10/22/23 Score                                Total 28/50; 56%   Interpretation of scores: Score Category Description  0-20% Minimal Disability The patient can cope with most living activities. Usually no treatment is indicated apart from advice on lifting, sitting and exercise  21-40% Moderate Disability The patient experiences more pain and difficulty with sitting, lifting and standing. Travel and social life are more difficult and they may be disabled from work. Personal care, sexual activity and sleeping are not grossly affected, and the patient can usually be managed by conservative means  41-60% Severe Disability Pain remains the main problem in this group, but activities of daily living are affected. These patients require a detailed investigation  61-80% Crippled Back pain impinges on all aspects of the patient's life. Positive intervention is required  81-100% Bed-bound These patients are either bed-bound or exaggerating their symptoms  Bluford FORBES Zoe DELENA Karon DELENA, et al. Surgery versus conservative management of stable thoracolumbar fracture: the PRESTO feasibility RCT. [place] ([place]):  [employer]; 2021 Nov. [facility] Assessment, No. 25.62.) Appendix 3, Oswestry Disability Index category descriptors. Available from: FindJewel[employer].cz  Minimally Clinically Important Difference (MCID) = 12.8%  COGNITION: Overall cognitive status: Within functional limits for tasks assessed     SENSATION: Both fingers go numb sometimes  MUSCLE LENGTH: Hamstrings: test next visit POSTURE: forward head, increased thoracic kyphosis, and flexed trunk   PALPATION: Right shoulder elevated; right rib hump; tenderness mid back and low back noted  LUMBAR ROM: next visit  AROM eval  Flexion   Extension   Right lateral flexion   Left lateral flexion   Right rotation   Left rotation    (Blank rows = not tested)  LOWER EXTREMITY ROM:     Active  Right eval Left eval  Hip flexion    Hip extension    Hip abduction    Hip adduction    Hip internal rotation    Hip external rotation    Knee flexion    Knee extension    Ankle dorsiflexion    Ankle plantarflexion    Ankle inversion    Ankle eversion     (Blank rows = not tested)  LOWER EXTREMITY MMT:    MMT Right eval Left eval  Hip flexion 3+ 4  Hip extension    Hip abduction    Hip adduction    Hip internal rotation    Hip external rotation    Knee flexion    Knee extension 3+ 4+  Ankle dorsiflexion 4- 4+  Ankle plantarflexion    Ankle inversion    Ankle eversion     (Blank rows = not tested)   FUNCTIONAL TESTS:  5 times sit to stand: 58.21 sec SLS unable   GAIT: Distance walked: 50 ft in  clinic Assistive device utilized: Walker - 4 wheeled Level of assistance: Modified independence Comments: flexed trunk; slow gait speed  TREATMENT DATE: 10/21/23 physical therapy evaluation and HEP instruction                                                                                                                                 PATIENT EDUCATION:  Education details:  Patient educated on exam findings, POC, scope of PT, HEP, and what to expect next visit. Person educated: Patient Education method: Explanation, Demonstration, and Handouts Education comprehension: verbalized understanding, returned demonstration, verbal cues required, and tactile cues required  HOME EXERCISE PROGRAM: 10/22/23: Decompression exercises 1-5  Access Code: [email or web address] URL: [email or web address] Date: 10/22/2023 Prepared by: [facility]  Exercises - Sitting to Supine Roll  - 1 x daily - 7 x weekly - 1 sets - 1 reps  ASSESSMENT:  CLINICAL IMPRESSION: Patient is a 68 y.o. female who was seen today for physical therapy evaluation and treatment for M41.9 (ICD-10-CM) - Scoliosis, unspecified.  Patient demonstrates muscle weakness, reduced ROM, and fascial restrictions which are likely contributing to symptoms of pain and are negatively impacting patient ability to perform ADLs and functional mobility tasks. Patient will benefit from skilled physical therapy services to address these deficits to reduce pain and improve level of function with ADLs and functional mobility tasks.   OBJECTIVE IMPAIRMENTS: Abnormal gait, decreased activity tolerance, decreased balance, decreased ROM, decreased strength, impaired perceived functional ability, and pain.   ACTIVITY LIMITATIONS: carrying, lifting, bending, sitting, standing, sleeping, transfers, bed mobility, and locomotion level  PARTICIPATION LIMITATIONS: meal prep, cleaning, laundry, shopping, and community activity  REHAB POTENTIAL: Good  CLINICAL DECISION MAKING: Evolving/moderate complexity  EVALUATION COMPLEXITY: Moderate   GOALS: Goals reviewed with patient? No  SHORT TERM GOALS: Target date: 11/05/2023  patient will be independent with initial HEP  Baseline: Goal status: INITIAL  2.  Patient will report 30% improvement overall  Baseline:  Goal status: INITIAL  LONG TERM GOALS: Target date:  11/19/23  Patient will be independent in self management strategies to improve quality of life and functional outcomes.  Baseline:  Goal status: INITIAL  2.  Patient will report 50% improvement overall  Baseline:  Goal status: INITIAL  3.  Patient will improve Modified Oswestry score by 8 points (20/50 or less)  to demonstrate improved perceived function  Baseline: 28/50 Goal status: INITIAL  4.  Patient will improve 5 times sit to stand score to 15 sec or less to demonstrate improved functional mobility and increased leg strength.    Baseline: 58.2 sec Goal status: INITIAL  5.   Patient will increase right leg MMT's to 4+ to 5/5 to allow navigation of steps without gait deviation or loss of balance  Baseline:  Goal status: INITIAL  6.  Patient will able to stand SLS on each leg x 5 to demonstrate improved functional balance  Baseline:  Goal status: INITIAL  PLAN:  PT FREQUENCY: 2x/week  PT DURATION: 4 weeks  PLANNED INTERVENTIONS: [id number]- PT Re-evaluation, 97110-Therapeutic exercises, 97530- Therapeutic activity, [id number]- Neuromuscular re-education, 97535- Self Care, [id number]- Manual therapy, [phone]- Gait training, [phone]- Orthotic Fit/training, [phone]- Canalith repositioning, [id number]- Aquatic Therapy, [phone]- Splinting, [phone]- Wound care (first 20 sq cm), 97598- Wound care (each additional 20 sq cm)Patient/Family education, Balance training, Stair training, Taping, Dry Needling, Joint mobilization, Joint manipulation, Spinal manipulation, Spinal mobilization, Scar mobilization, and DME instructions. SABRA  PLAN FOR NEXT SESSION: ' check lumbar mobility; Review HEP and goals; decompression exercises; postural strengthening; balance; unable to tolerate long periods of standing  10:29 AM, 10/22/23 Shyleigh Daughtry Small Rosell Khouri MPT [facility] [place] [phone] Ph:[phone]

## 2023-10-22 ENCOUNTER — Ambulatory Visit (HOSPITAL_COMMUNITY)

## 2023-10-22 ENCOUNTER — Other Ambulatory Visit: Payer: Self-pay

## 2023-10-22 DIAGNOSIS — R2689 Other abnormalities of gait and mobility: Secondary | ICD-10-CM | POA: Insufficient documentation

## 2023-10-22 DIAGNOSIS — M816 Localized osteoporosis [Lequesne]: Secondary | ICD-10-CM | POA: Diagnosis not present

## 2023-10-22 DIAGNOSIS — R262 Difficulty in walking, not elsewhere classified: Secondary | ICD-10-CM | POA: Diagnosis not present

## 2023-10-22 DIAGNOSIS — M5459 Other low back pain: Secondary | ICD-10-CM | POA: Insufficient documentation

## 2023-10-22 DIAGNOSIS — R293 Abnormal posture: Secondary | ICD-10-CM | POA: Diagnosis not present

## 2023-10-24 LAB — COMPREHENSIVE METABOLIC PANEL WITH GFR
ALT: 16 IU/L (ref 0–32)
AST: 14 IU/L (ref 0–40)
Albumin: 4.5 g/dL (ref 3.9–4.9)
Alkaline Phosphatase: 81 IU/L (ref 49–135)
BUN/Creatinine Ratio: 19 (ref 12–28)
BUN: 31 mg/dL — ABNORMAL HIGH (ref 8–27)
Bilirubin Total: 0.3 mg/dL (ref 0.0–1.2)
CO2: 18 mmol/L — ABNORMAL LOW (ref 20–29)
Calcium: 10.8 mg/dL — ABNORMAL HIGH (ref 8.7–10.3)
Chloride: 109 mmol/L — ABNORMAL HIGH (ref 96–106)
Creatinine, Ser: 1.64 mg/dL — ABNORMAL HIGH (ref 0.57–1.00)
Globulin, Total: 2.3 g/dL (ref 1.5–4.5)
Glucose: 86 mg/dL (ref 70–99)
Potassium: 5.4 mmol/L — ABNORMAL HIGH (ref 3.5–5.2)
Sodium: 141 mmol/L (ref 134–144)
Total Protein: 6.8 g/dL (ref 6.0–8.5)
eGFR: 34 mL/min/1.73 — ABNORMAL LOW (ref >59)

## 2023-10-24 LAB — TSH: TSH: 1.49 u[IU]/mL (ref 0.450–4.500)

## 2023-10-24 LAB — PTH, INTACT AND CALCIUM: PTH: 44 pg/mL (ref 15–65)

## 2023-10-24 LAB — MAGNESIUM: Magnesium: 1.9 mg/dL (ref 1.6–2.3)

## 2023-10-24 LAB — T4, FREE: Free T4: 0.7 ng/dL — ABNORMAL LOW (ref 0.82–1.77)

## 2023-10-24 LAB — PHOSPHORUS: Phosphorus: 3.2 mg/dL (ref 3.0–4.3)

## 2023-10-28 DIAGNOSIS — M48062 Spinal stenosis, lumbar region with neurogenic claudication: Secondary | ICD-10-CM | POA: Diagnosis not present

## 2023-10-28 DIAGNOSIS — M5416 Radiculopathy, lumbar region: Secondary | ICD-10-CM | POA: Diagnosis not present

## 2023-10-29 ENCOUNTER — Ambulatory Visit (HOSPITAL_COMMUNITY)

## 2023-10-29 ENCOUNTER — Encounter (HOSPITAL_COMMUNITY): Payer: Self-pay

## 2023-10-29 DIAGNOSIS — R293 Abnormal posture: Secondary | ICD-10-CM | POA: Diagnosis not present

## 2023-10-29 DIAGNOSIS — R262 Difficulty in walking, not elsewhere classified: Secondary | ICD-10-CM

## 2023-10-29 DIAGNOSIS — R2689 Other abnormalities of gait and mobility: Secondary | ICD-10-CM

## 2023-10-29 DIAGNOSIS — M5459 Other low back pain: Secondary | ICD-10-CM | POA: Diagnosis not present

## 2023-10-29 NOTE — Therapy (Addendum)
 OUTPATIENT PHYSICAL THERAPY THORACOLUMBAR TREATMENT   Patient Name: Erica Gallegos MRN: [id number] DOB:12/09/1955, 68 y.o., female Today's Date: 10/29/2023  END OF SESSION:  PT End of Session - 10/29/23 0949     Visit Number 2    Number of Visits 8    Date for Recertification  11/19/23    Authorization Type BCBS Medicare    Authorization Time Period no auth needed    PT Start Time 0949    PT Stop Time 1028    PT Time Calculation (min) 39 min    Activity Tolerance Patient tolerated treatment well    Behavior During Therapy WFL for tasks assessed/performed           Past Medical History:  Diagnosis Date   Allergy    Anxiety    Arthritis    Cataract    Removed/both eyes   Chronic kidney disease    Small cyst   Complication of anesthesia    Patient woke up during anesthesia with bunionectomy   GERD (gastroesophageal reflux disease)    HLD (hyperlipidemia)    HTN (hypertension)    Major depressive disorder    Migraine with aura    Neuromuscular disorder (HCC) 2018/approximately   Spinal stenosis   PONV (postoperative nausea and vomiting)    RAS (renal artery stenosis)    Scoliosis    Sleep apnea    Sleep related hypoxia    Spinal stenosis    Past Surgical History:  Procedure Laterality Date   BUNIONECTOMY     CATARACT EXTRACTION     CHOLECYSTECTOMY     COLONOSCOPY WITH PROPOFOL  N/A 12/13/2021   Procedure: COLONOSCOPY WITH PROPOFOL ;  Surgeon: Shaaron Lamar CHRISTELLA, MD;  [facility]: [facility];  Service: Endoscopy;  Laterality: N/A;  8:00am, asa 2   ESOPHAGOGASTRODUODENOSCOPY (EGD) WITH PROPOFOL  N/A 04/05/2021   Procedure: ESOPHAGOGASTRODUODENOSCOPY (EGD) WITH PROPOFOL ;  Surgeon: Shaaron Lamar CHRISTELLA, MD;  [facility]: [facility];  Service: Endoscopy;  Laterality: N/A;  10:15am   ESOPHAGOGASTRODUODENOSCOPY (EGD) WITH PROPOFOL  N/A 12/13/2021   Procedure: ESOPHAGOGASTRODUODENOSCOPY (EGD) WITH PROPOFOL ;  Surgeon: Shaaron Lamar CHRISTELLA, MD;  [facility]: [facility];  Service:  Endoscopy;  Laterality: N/A;   EYE SURGERY     MALONEY DILATION N/A 04/05/2021   Procedure: MALONEY DILATION;  Surgeon: Shaaron Lamar CHRISTELLA, MD;  [facility]: [facility];  Service: Endoscopy;  Laterality: N/A;   MALONEY DILATION N/A 12/13/2021   Procedure: AGAPITO DILATION;  Surgeon: Shaaron Lamar CHRISTELLA, MD;  Location: [facility];  Service: Endoscopy;  Laterality: N/A;   TUBAL LIGATION  1987   Patient Active Problem List   Diagnosis Date Noted   Solitary pulmonary nodule on lung CT 09/22/2023   Chronic kidney disease, stage 3b (HCC) 05/15/2023   Itching 12/26/2022   PTSD (post-traumatic stress disorder) 11/12/2022   Abnormal thyroid  blood test 06/27/2022   Hypercalcemia 06/27/2022   Need for pneumococcal 20-valent conjugate vaccination 06/18/2022   Anxiety and depression 02/26/2022   OSA on CPAP 02/26/2022   Ocular pain, right eye 02/26/2022   Need for influenza vaccination 10/23/2021   Need for shingles vaccine 10/23/2021   Memory problem 08/21/2021   Osteoporosis 07/24/2021   Vitamin D  deficiency 07/24/2021   Neuropathy 03/22/2021   Hyperlipidemia 03/22/2021   Spinal stenosis 03/22/2021   Fall 03/22/2021   Right shoulder pain 03/22/2021   Left knee pain 03/22/2021   Encounter for screening fecal occult blood testing 08/28/2020   Encounter for gynecological examination with Papanicolaou smear of cervix  08/28/2020   Dysphagia 03/02/2020   Essential hypertension 03/02/2020   Nausea without vomiting 03/02/2020   Gastroesophageal reflux disease 03/02/2020    PCP: Melvenia Mungo, MD  REFERRING PROVIDER: Darnella Dorn SAUNDERS, MD  REFERRING DIAG: M41.9 (ICD-10-CM) - Scoliosis, unspecified  Rationale for Evaluation and Treatment: Rehabilitation  THERAPY DIAG:  Other low back pain  Difficulty in walking, not elsewhere classified  Abnormal posture  Other abnormalities of gait and mobility  ONSET DATE: back pain for years  SUBJECTIVE:                                                                                                                                                                                            SUBJECTIVE STATEMENT: Pt states pain was a 2-3/10 upon presentation, having . Pt states she wants to have surgery and that this is just a requirement due to medicaid. Pt states the shoulder exercise was painful in back.   Eval: Arrives with rollater; I need balance too; referred for scoliosis and also has spinal stenosis; takes reclast  for osteoporosis.  My goal is to have surgery done.  Originally injured her back in the 18's.  Worst pain in the last 3 years; was originally seeing Dr. Carollee.  Now seeing Dr. Darnella who referred for therapy.  If she has surgery will [facility] or [facility]; has had to start using a rollator over the past year.  MD told me it (therapy) would not help  PERTINENT HISTORY:  Anxiety, HTN, migraine, osteoporosis, PTSD; spinal stenosis  PAIN:  Are you having pain? Yes: NPRS scale: 3/10; 3/10 at best; 10/10 at worst Pain location: low back and but now up to right shoulder and arm, right leg can give out Pain description: aching sore constant but can worsen with activity Aggravating factors: standing and walking, turning over in bed Relieving factors: sit, rest  PRECAUTIONS: Fall  RED FLAGS: None   WEIGHT BEARING RESTRICTIONS: No  FALLS:  Has patient fallen in last 6 months? Yes. Number of falls 1  LIVING ENVIRONMENT: Lives with: lives with their spouse Lives in: House/apartment Stairs: No ramp entry Has following equipment at home: Vannie - 4 wheeled  OCCUPATION: retired  PLOF: Independent with household mobility with device and Independent with community mobility with device  PATIENT GOALS: to have surgery done  NEXT MD VISIT: PRN  OBJECTIVE:  Note: Objective measures were completed at Evaluation unless otherwise noted.  DIAGNOSTIC FINDINGS:  DEGENERATIVE CHANGES: Moderate disc height loss at C5-6  and C6-7. Small central disc-osteophyte complex at C6-7. Unchanged severe right neural foraminal narrowing at C6-7. Unchanged moderate right and  severe left neural foraminal narrowing at C5-6 due to facet arthropathy and uncovertebral joint spurring.I  IMPRESSION: 1. Severe spinal canal stenosis at L1-2, L2-3, and L4-5 levels. 2. Severe right neural foraminal narrowing at L2-3 and severe bilateral neural foraminal narrowing at L4-5. 3. Levoscoliotic curvature of the lumbar spine with 7 mm left lateral listhesis of L3 on L4 and 10 mm right lateral listhesis of L1 on L2. 4. No significant spinal canal stenosis in the cervical or thoracic spine. 5. Moderate right and severe left neural foraminal narrowing at C5-6. 6. Severe right neural foraminal narrowing at C6-7. 7. 10 mm solid lung nodule in the left lower lobe. Per Fleischner society recommendations, consider CT, PET/CT, or tissue sampling at 3 months.  PATIENT SURVEYS:  Modified Oswestry:  MODIFIED OSWESTRY DISABILITY SCALE  Date: 10/22/23 Score                                Total 28/50; 56%   Interpretation of scores: Score Category Description  0-20% Minimal Disability The patient can cope with most living activities. Usually no treatment is indicated apart from advice on lifting, sitting and exercise  21-40% Moderate Disability The patient experiences more pain and difficulty with sitting, lifting and standing. Travel and social life are more difficult and they may be disabled from work. Personal care, sexual activity and sleeping are not grossly affected, and the patient can usually be managed by conservative means  41-60% Severe Disability Pain remains the main problem in this group, but activities of daily living are affected. These patients require a detailed investigation  61-80% Crippled Back pain impinges on all aspects of the patient's life. Positive intervention is required  81-100% Bed-bound These patients are  either bed-bound or exaggerating their symptoms  Bluford FORBES Zoe DELENA Karon DELENA, et al. Surgery versus conservative management of stable thoracolumbar fracture: the PRESTO feasibility RCT. [place]): [employer]; 2021 Nov. [facility] Technology Assessment, No. 25.62.) Appendix 3, Oswestry Disability Index category descriptors. Available from: FindJewel[employer].cz  Minimally Clinically Important Difference (MCID) = 12.8%  COGNITION: Overall cognitive status: Within functional limits for tasks assessed     SENSATION: Both fingers go numb sometimes  MUSCLE LENGTH: Hamstrings: test next visit POSTURE: forward head, increased thoracic kyphosis, and flexed trunk   PALPATION: Right shoulder elevated; right rib hump; tenderness mid back and low back noted  LUMBAR ROM:  AROM eval  Flexion 65, tightness in hamstinrgs, BUE support on walker  Extension 15  Right lateral flexion 6  Left lateral flexion 11  Right rotation Unable, legs giving out  Left rotation Unable, legs giving out   (Blank rows = not tested)  LOWER EXTREMITY ROM:     Active  Right eval Left eval  Hip flexion    Hip extension    Hip abduction    Hip adduction    Hip internal rotation    Hip external rotation    Knee flexion    Knee extension    Ankle dorsiflexion    Ankle plantarflexion    Ankle inversion    Ankle eversion     (Blank rows = not tested)  LOWER EXTREMITY MMT:    MMT Right eval Left eval  Hip flexion 3+ 4  Hip extension    Hip abduction    Hip adduction    Hip internal rotation    Hip external rotation    Knee flexion  Knee extension 3+ 4+  Ankle dorsiflexion 4- 4+  Ankle plantarflexion    Ankle inversion    Ankle eversion     (Blank rows = not tested)   FUNCTIONAL TESTS:  5 times sit to stand: 58.21 sec SLS unable   GAIT: Distance walked: 50 ft in clinic Assistive device utilized: Walker - 4 wheeled Level of assistance: Modified  independence Comments: flexed trunk; slow gait speed  TREATMENT DATE:  10/29/2023  -Lumbar ROM obtained (pts standing tolerance limited) -Moist heat pack applied in supine during plinth activities 15 minutes -ROM assessed Therapeutic Exercise: -Supine bridges 2 sets of 5 reps, 3 second holds, symptomatic, pt cued for max hip extension -Hook lying clamshells, 2 sets of 8 reps, 3 second holds, RTB at knees  -SLR, partial ROM due to pain, 1 set of 8 reps, pt cued for core activation throughout Neuromuscular Re-education: -PPT 2 set of 10 reps, 3 second holds, pr cued to remain in pain free ROM -LTR 2 set of 10 reps bilaterally, pt cued to remain in pain free ROM   10/21/23 physical therapy evaluation and HEP instruction                                                                                                                                 PATIENT EDUCATION:  Education details: Patient educated on exam findings, POC, scope of PT, HEP, and what to expect next visit. Person educated: Patient Education method: Explanation, Demonstration, and Handouts Education comprehension: verbalized understanding, returned demonstration, verbal cues required, and tactile cues required  HOME EXERCISE PROGRAM: 10/22/23: Decompression exercises 1-5  Access Code: [email or web address] URL: [email or web address] Date: 10/22/2023 Prepared by: [facility]  Exercises - Sitting to Supine Roll  - 1 x daily - 7 x weekly - 1 sets - 1 reps  ASSESSMENT:  CLINICAL IMPRESSION: Patient continues to demonstrate significant pain in low back, decreased LE/core strength, decreased gait quality and balance. MHP applied for pain management during supine activities, pt demonstrates abnormal visual and verbal signs of pain with placement and removal of MHP. Patient also demonstrates decreased endurance with limited standing tolerance during today's session when measuring lumbar spine ROM. Patient able to progress  dynamic core and postural activation exercises today with pelvic tilts and bridge variations, fair performance with verbal cueing. Pt requires education on role of therapy although she would prefer to just go ahead and have the surgery, pt also educated on progression of HEP. Patient would continue to benefit from skilled physical therapy for decreased low back and LE pain, increased endurance with ambulation/ standing tolerance, increased LE/core strength, and improved balance for improved quality of life, improved independence with gait training and continued progress towards therapy goals.   Eval: Patient is a 68 y.o. female who was seen today for physical therapy evaluation and treatment for M41.9 (ICD-10-CM) - Scoliosis, unspecified.  Patient demonstrates muscle weakness, reduced ROM, and fascial restrictions which are likely  contributing to symptoms of pain and are negatively impacting patient ability to perform ADLs and functional mobility tasks. Patient will benefit from skilled physical therapy services to address these deficits to reduce pain and improve level of function with ADLs and functional mobility tasks.   OBJECTIVE IMPAIRMENTS: Abnormal gait, decreased activity tolerance, decreased balance, decreased ROM, decreased strength, impaired perceived functional ability, and pain.   ACTIVITY LIMITATIONS: carrying, lifting, bending, sitting, standing, sleeping, transfers, bed mobility, and locomotion level  PARTICIPATION LIMITATIONS: meal prep, cleaning, laundry, shopping, and community activity  REHAB POTENTIAL: Good  CLINICAL DECISION MAKING: Evolving/moderate complexity  EVALUATION COMPLEXITY: Moderate   GOALS: Goals reviewed with patient? Yes  SHORT TERM GOALS: Target date: 11/05/2023  patient will be independent with initial HEP  Baseline: Goal status: INITIAL  2.  Patient will report 30% improvement overall  Baseline:  Goal status: INITIAL  LONG TERM GOALS: Target  date: 11/19/23  Patient will be independent in self management strategies to improve quality of life and functional outcomes.  Baseline:  Goal status: INITIAL  2.  Patient will report 50% improvement overall  Baseline:  Goal status: INITIAL  3.  Patient will improve Modified Oswestry score by 8 points (20/50 or less)  to demonstrate improved perceived function  Baseline: 28/50 Goal status: INITIAL  4.  Patient will improve 5 times sit to stand score to 15 sec or less to demonstrate improved functional mobility and increased leg strength.    Baseline: 58.2 sec Goal status: INITIAL  5.   Patient will increase right leg MMT's to 4+ to 5/5 to allow navigation of steps without gait deviation or loss of balance  Baseline:  Goal status: INITIAL  6.  Patient will able to stand SLS on each leg x 5 to demonstrate improved functional balance  Baseline:  Goal status: INITIAL  PLAN:  PT FREQUENCY: 2x/week  PT DURATION: 4 weeks  PLANNED INTERVENTIONS: 97164- PT Re-evaluation, 97110-Therapeutic exercises, 97530- Therapeutic activity, 97112- Neuromuscular re-education, 97535- Self Care, [id number]- Manual therapy, [id number]- Gait training, [phone]- Orthotic Fit/training, [phone]- Canalith repositioning, [id number]- Aquatic Therapy, [phone]- Splinting, [phone]- Wound care (first 20 sq cm), 97598- Wound care (each additional 20 sq cm)Patient/Family education, Balance training, Stair training, Taping, Dry Needling, Joint mobilization, Joint manipulation, Spinal manipulation, Spinal mobilization, Scar mobilization, and DME instructions. SABRA  PLAN FOR NEXT SESSION: decompression exercises; postural strengthening; balance; unable to tolerate long periods of standing  Lang Ada, PT, DPT [facility] Office: [phone] 2:54 PM, 10/29/23

## 2023-10-31 ENCOUNTER — Telehealth (HOSPITAL_COMMUNITY): Payer: Self-pay

## 2023-10-31 ENCOUNTER — Encounter: Payer: Medicare Other | Attending: "Endocrinology | Admitting: Internal Medicine

## 2023-10-31 ENCOUNTER — Other Ambulatory Visit: Payer: Self-pay | Admitting: "Endocrinology

## 2023-10-31 VITALS — BP 143/78 | HR 57 | Temp 98.2°F | Resp 16

## 2023-10-31 DIAGNOSIS — M810 Age-related osteoporosis without current pathological fracture: Secondary | ICD-10-CM

## 2023-10-31 MED ORDER — ZOLEDRONIC ACID 5 MG/100ML IV SOLN
5.0000 mg | Freq: Once | INTRAVENOUS | Status: AC
  Administered 2023-10-31: 5 mg via INTRAVENOUS

## 2023-10-31 NOTE — Addendum Note (Signed)
 Addended by: BLANCA SELINDA SAUNDERS on: 10/31/2023 12:45 PM   Modules accepted: Orders

## 2023-10-31 NOTE — Progress Notes (Signed)
 Diagnosis: Rheumatoid Arthritis  Provider:  Lenis Ethelle Kell MD  Procedure: IV Infusion  IV Type: Peripheral, IV Location: R Antecubital  Reclast  (Zolendronic Acid), Dose: 5 mg  Infusion Start Time: 1141  Infusion Stop Time: 1221  Post Infusion IV Care: Observation period completed  Discharge: Condition: Good, Destination: Home . AVS Provided  Performed by:  Blanca Selinda SAUNDERS, LPN

## 2023-10-31 NOTE — Telephone Encounter (Signed)
 Auth Submission: NO AUTH NEEDED Site of care: Site of care: [facility] Payer: BCBS Medicare Medication & CPT/J Code(s) submitted: Reclast  (Zolendronic acid) [id number] Diagnosis Code: M81.0 Route of submission (phone, fax, portal):  Phone # Fax # Auth type: Buy/Bill PB Units/visits requested: 5mg  x 1 dose Reference number:  Approval from: 10/28/23 to 01/07/24

## 2023-11-05 ENCOUNTER — Ambulatory Visit (HOSPITAL_COMMUNITY)

## 2023-11-05 ENCOUNTER — Encounter (HOSPITAL_COMMUNITY): Payer: Self-pay

## 2023-11-05 DIAGNOSIS — R2689 Other abnormalities of gait and mobility: Secondary | ICD-10-CM | POA: Diagnosis not present

## 2023-11-05 DIAGNOSIS — R262 Difficulty in walking, not elsewhere classified: Secondary | ICD-10-CM

## 2023-11-05 DIAGNOSIS — M5459 Other low back pain: Secondary | ICD-10-CM | POA: Diagnosis not present

## 2023-11-05 DIAGNOSIS — R293 Abnormal posture: Secondary | ICD-10-CM

## 2023-11-05 NOTE — Therapy (Signed)
 OUTPATIENT PHYSICAL THERAPY THORACOLUMBAR TREATMENT   Patient Name: Erica Gallegos MRN: [id number] DOB:[birth year], 68 y.o., female Today's Date: 11/05/2023  END OF SESSION:  PT End of Session - 11/05/23 0948     Visit Number 3    Number of Visits 8    Date for Recertification  11/19/23    Authorization Type [employer]BS Medicare    Authorization Time Period no auth needed    PT Start Time 0948    PT Stop Time 1028    PT Time Calculation (min) 40 min    Activity Tolerance Patient tolerated treatment well    Behavior During Therapy WFL for tasks assessed/performed            Past Medical History:  Diagnosis Date   Allergy    Anxiety    Arthritis    Cataract    Removed/both eyes   Chronic kidney disease    Small cyst   Complication of anesthesia    Patient woke up during anesthesia with bunionectomy   GERD (gastroesophageal reflux disease)    HLD (hyperlipidemia)    HTN (hypertension)    Major depressive disorder    Migraine with aura    Neuromuscular disorder (HCC) 2018/approximately   Spinal stenosis   PONV (postoperative nausea and vomiting)    RAS (renal artery stenosis)    Scoliosis    Sleep apnea    Sleep related hypoxia    Spinal stenosis    Past Surgical History:  Procedure Laterality Date   BUNIONECTOMY     CATARACT EXTRACTION     CHOLECYSTECTOMY     COLONOSCOPY WITH PROPOFOL  N/A 12/13/2021   Procedure: COLONOSCOPY WITH PROPOFOL ;  Surgeon: Shaaron Lamar CHRISTELLA, MD;  [facility];  Service: Endoscopy;  Laterality: N/A;  8:00am, asa 2   ESOPHAGOGASTRODUODENOSCOPY (EGD) WITH PROPOFOL  N/A 04/05/2021   Procedure: ESOPHAGOGASTRODUODENOSCOPY (EGD) WITH PROPOFOL ;  Surgeon: Shaaron Lamar CHRISTELLA, MD;  [facility];  Service: Endoscopy;  Laterality: N/A;  10:15am   ESOPHAGOGASTRODUODENOSCOPY (EGD) WITH PROPOFOL  N/A 12/13/2021   Procedure: ESOPHAGOGASTRODUODENOSCOPY (EGD) WITH PROPOFOL ;  Surgeon: Shaaron Lamar CHRISTELLA, MD;  [facility];  Service:  Endoscopy;  Laterality: N/A;   EYE SURGERY     MALONEY DILATION N/A 04/05/2021   Procedure: MALONEY DILATION;  Surgeon: Shaaron Lamar CHRISTELLA, MD;  [facility];  Service: Endoscopy;  Laterality: N/A;   MALONEY DILATION N/A 12/13/2021   Procedure: AGAPITO DILATION;  Surgeon: Shaaron Lamar CHRISTELLA, MD;  Location: [facility];  Service: Endoscopy;  Laterality: N/A;   TUBAL LIGATION  1987   Patient Active Problem List   Diagnosis Date Noted   Solitary pulmonary nodule on lung CT 09/22/2023   Chronic kidney disease, stage 3b (HCC) 05/15/2023   Itching 12/26/2022   PTSD (post-traumatic stress disorder) 11/12/2022   Abnormal thyroid  blood test 06/27/2022   Hypercalcemia 06/27/2022   Need for pneumococcal 20-valent conjugate vaccination 06/18/2022   Anxiety and depression 02/26/2022   OSA on CPAP 02/26/2022   Ocular pain, right eye 02/26/2022   Need for influenza vaccination 10/23/2021   Need for shingles vaccine 10/23/2021   Memory problem 08/21/2021   Osteoporosis 07/24/2021   Vitamin D  deficiency 07/24/2021   Neuropathy 03/22/2021   Hyperlipidemia 03/22/2021   Spinal stenosis 03/22/2021   Fall 03/22/2021   Right shoulder pain 03/22/2021   Left knee pain 03/22/2021   Encounter for screening fecal occult blood testing 08/28/2020   Encounter for gynecological examination with Papanicolaou smear of  cervix 08/28/2020   Dysphagia 03/02/2020   Essential hypertension 03/02/2020   Nausea without vomiting 03/02/2020   Gastroesophageal reflux disease 03/02/2020    PCP: Melvenia Mungo, MD  REFERRING PROVIDER: Darnella Dorn SAUNDERS, MD  REFERRING DIAG: M41.9 (ICD-10-CM) - Scoliosis, unspecified  Rationale for Evaluation and Treatment: Rehabilitation  THERAPY DIAG:  Other low back pain  Difficulty in walking, not elsewhere classified  Abnormal posture  Other abnormalities of gait and mobility  ONSET DATE: back pain for years  SUBJECTIVE:                                                                                                                                                                                            SUBJECTIVE STATEMENT: Pt states pain was a 2/10 upon presentation, having numbness in BLE sciatic region. Pt states HEP is touch and go, just depends on the day and how she's feeling.   Eval: Arrives with rollater; I need balance too; referred for scoliosis and also has spinal stenosis; takes reclast  for osteoporosis.  My goal is to have surgery done.  Originally injured her back in the 89's.  Worst pain in the last 3 years; was originally seeing Dr. Carollee.  Now seeing Dr. Darnella who referred for therapy.  If she has surgery will be at [facility] or [facility]; has had to start using a rollator over the past year.  MD told me it (therapy) would not help  PERTINENT HISTORY:  Anxiety, HTN, migraine, osteoporosis, PTSD; spinal stenosis  PAIN:  Are you having pain? Yes: NPRS scale: 3/10; 3/10 at best; 10/10 at worst Pain location: low back and but now up to right shoulder and arm, right leg can give out Pain description: aching sore constant but can worsen with activity Aggravating factors: standing and walking, turning over in bed Relieving factors: sit, rest  PRECAUTIONS: Fall  RED FLAGS: None   WEIGHT BEARING RESTRICTIONS: No  FALLS:  Has patient fallen in last 6 months? Yes. Number of falls 1  LIVING ENVIRONMENT: Lives with: lives with their spouse Lives in: House/apartment Stairs: No ramp entry Has following equipment at home: Vannie - 4 wheeled  OCCUPATION: retired  PLOF: Independent with household mobility with device and Independent with community mobility with device  PATIENT GOALS: to have surgery done  NEXT MD VISIT: PRN  OBJECTIVE:  Note: Objective measures were completed at Evaluation unless otherwise noted.  DIAGNOSTIC FINDINGS:  DEGENERATIVE CHANGES: Moderate disc height loss at C5-6 and C6-7. Small central  disc-osteophyte complex at C6-7. Unchanged severe right neural foraminal narrowing at C6-7. Unchanged moderate right and severe left neural foraminal narrowing  at C5-6 due to facet arthropathy and uncovertebral joint spurring.I  IMPRESSION: 1. Severe spinal canal stenosis at L1-2, L2-3, and L4-5 levels. 2. Severe right neural foraminal narrowing at L2-3 and severe bilateral neural foraminal narrowing at L4-5. 3. Levoscoliotic curvature of the lumbar spine with 7 mm left lateral listhesis of L3 on L4 and 10 mm right lateral listhesis of L1 on L2. 4. No significant spinal canal stenosis in the cervical or thoracic spine. 5. Moderate right and severe left neural foraminal narrowing at C5-6. 6. Severe right neural foraminal narrowing at C6-7. 7. 10 mm solid lung nodule in the left lower lobe. Per Fleischner society recommendations, consider CT, PET/CT, or tissue sampling at 3 months.  PATIENT SURVEYS:  Modified Oswestry:  MODIFIED OSWESTRY DISABILITY SCALE  Date: 10/22/23 Score                                Total 28/50; 56%   Interpretation of scores: Score Category Description  0-20% Minimal Disability The patient can cope with most living activities. Usually no treatment is indicated apart from advice on lifting, sitting and exercise  21-40% Moderate Disability The patient experiences more pain and difficulty with sitting, lifting and standing. Travel and social life are more difficult and they may be disabled from work. Personal care, sexual activity and sleeping are not grossly affected, and the patient can usually be managed by conservative means  41-60% Severe Disability Pain remains the main problem in this group, but activities of daily living are affected. These patients require a detailed investigation  61-80% Crippled Back pain impinges on all aspects of the patient's life. Positive intervention is required  81-100% Bed-bound These patients are either bed-bound or  exaggerating their symptoms  Bluford FORBES Zoe DELENA Karon DELENA, et al. Surgery versus conservative management of stable thoracolumbar fracture: the PRESTO feasibility RCT. [place] ([place]): [employer]; 2021 Nov. [facility] Technology Assessment, No. 25.62.) Appendix 3, Oswestry Disability Index category descriptors. Available from: Findjewelers.cz  Minimally Clinically Important Difference (MCID) = 12.8%  COGNITION: Overall cognitive status: Within functional limits for tasks assessed     SENSATION: Both fingers go numb sometimes  MUSCLE LENGTH: Hamstrings: test next visit POSTURE: forward head, increased thoracic kyphosis, and flexed trunk   PALPATION: Right shoulder elevated; right rib hump; tenderness mid back and low back noted  LUMBAR ROM:  AROM eval  Flexion 65, tightness in hamstinrgs, BUE support on walker  Extension 15  Right lateral flexion 6  Left lateral flexion 11  Right rotation Unable, legs giving out  Left rotation Unable, legs giving out   (Blank rows = not tested)  LOWER EXTREMITY ROM:     Active  Right eval Left eval  Hip flexion    Hip extension    Hip abduction    Hip adduction    Hip internal rotation    Hip external rotation    Knee flexion    Knee extension    Ankle dorsiflexion    Ankle plantarflexion    Ankle inversion    Ankle eversion     (Blank rows = not tested)  LOWER EXTREMITY MMT:    MMT Right eval Left eval  Hip flexion 3+ 4  Hip extension    Hip abduction    Hip adduction    Hip internal rotation    Hip external rotation    Knee flexion    Knee extension 3+ 4+  Ankle dorsiflexion 4- 4+  Ankle plantarflexion    Ankle inversion    Ankle eversion     (Blank rows = not tested)   FUNCTIONAL TESTS:  5 times sit to stand: 58.21 sec SLS unable   GAIT: Distance walked: 50 ft in clinic Assistive device utilized: Walker - 4 wheeled Level of assistance: Modified independence Comments:  flexed trunk; slow gait speed  TREATMENT DATE:  11/05/2023  Therapeutic Exercise: -Nustep, 5 minutes, level 1 resistance, pt cued for 50-60 spm -Clamshells, 2 sets of 10 reps, RTB at knees second set both sides (only 5 on second set) -Supine bridges 2 sets of 5 reps, 3 second holds, symptomatic, pt cued for max hip extension, RTB at knees -DKTC on green exercise ball, pt cued for smooth and controlled movement, 2 set of 10 reps -Hip 3 way kicks, 1 set of 3 reps, 3lb ankle weights, pt cued for form and sequencing Neuromuscular Re-education: -Walking marches, 1 lap on 20 foot line, 3lb ankle weights, pt cued for use of AD and increased hip ROM -LTR on green exercise ball, 2 set of 10 reps bilaterally, pt cued to remain in pain free ROM Therapeutic Activity: -Sit to stands with 5 lb kettle bell and shoulder press, 2 sets of 3 reps, pt cued for core activation   10/29/2023  -Lumbar ROM obtained (pts standing tolerance limited) -Moist heat pack applied in supine during plinth activities 15 minutes -ROM assessed Therapeutic Exercise: -Supine bridges 2 sets of 5 reps, 3 second holds, symptomatic, pt cued for max hip extension -Hook lying clamshells, 2 sets of 8 reps, 3 second holds, RTB at knees  -SLR, partial ROM due to pain, 1 set of 8 reps, pt cued for core activation throughout Neuromuscular Re-education: -PPT 2 set of 10 reps, 3 second holds, pr cued to remain in pain free ROM -LTR 2 set of 10 reps bilaterally, pt cued to remain in pain free ROM   10/21/23 physical therapy evaluation and HEP instruction                                                                                                                                 PATIENT EDUCATION:  Education details: Patient educated on exam findings, POC, scope of PT, HEP, and what to expect next visit. Person educated: Patient Education method: Explanation, Demonstration, and Handouts Education comprehension: verbalized  understanding, returned demonstration, verbal cues required, and tactile cues required  HOME EXERCISE PROGRAM: 10/22/23: Decompression exercises 1-5  Access Code: [email or web address] URL: [email or web address] Date: 10/22/2023 Prepared by: [facility]  Exercises - Sitting to Supine Roll  - 1 x daily - 7 x weekly - 1 sets - 1 reps  ASSESSMENT:  CLINICAL IMPRESSION: Patient continues to demonstrate decreased LE/core strength, decreased gait quality and balance. Patient also demonstrates fair endurance with aerobic based exercise during today's session on nustep. Patient able to initiate  dynamic balance and core activation exercises  today with clamshells and resisted walking, good performance with verbal cueing. Patient would continue to benefit from skilled physical therapy for increased endurance with ambulation, increased LE/core strength, and improved balance for improved quality of life, improved independence with gait training and continued progress towards therapy goals.    Eval: Patient is a 68 y.o. female who was seen today for physical therapy evaluation and treatment for M41.9 (ICD-10-CM) - Scoliosis, unspecified.  Patient demonstrates muscle weakness, reduced ROM, and fascial restrictions which are likely contributing to symptoms of pain and are negatively impacting patient ability to perform ADLs and functional mobility tasks. Patient will benefit from skilled physical therapy services to address these deficits to reduce pain and improve level of function with ADLs and functional mobility tasks.   OBJECTIVE IMPAIRMENTS: Abnormal gait, decreased activity tolerance, decreased balance, decreased ROM, decreased strength, impaired perceived functional ability, and pain.   ACTIVITY LIMITATIONS: carrying, lifting, bending, sitting, standing, sleeping, transfers, bed mobility, and locomotion level  PARTICIPATION LIMITATIONS: meal prep, cleaning, laundry, shopping, and community  activity  REHAB POTENTIAL: Good  CLINICAL DECISION MAKING: Evolving/moderate complexity  EVALUATION COMPLEXITY: Moderate   GOALS: Goals reviewed with patient? Yes  SHORT TERM GOALS: Target date: 11/05/2023  patient will be independent with initial HEP  Baseline: Goal status: INITIAL  2.  Patient will report 30% improvement overall  Baseline:  Goal status: INITIAL  LONG TERM GOALS: Target date: 11/19/23  Patient will be independent in self management strategies to improve quality of life and functional outcomes.  Baseline:  Goal status: INITIAL  2.  Patient will report 50% improvement overall  Baseline:  Goal status: INITIAL  3.  Patient will improve Modified Oswestry score by 8 points (20/50 or less)  to demonstrate improved perceived function  Baseline: 28/50 Goal status: INITIAL  4.  Patient will improve 5 times sit to stand score to 15 sec or less to demonstrate improved functional mobility and increased leg strength.    Baseline: 58.2 sec Goal status: INITIAL  5.   Patient will increase right leg MMT's to 4+ to 5/5 to allow navigation of steps without gait deviation or loss of balance  Baseline:  Goal status: INITIAL  6.  Patient will able to stand SLS on each leg x 5 to demonstrate improved functional balance  Baseline:  Goal status: INITIAL  PLAN:  PT FREQUENCY: 2x/week  PT DURATION: 4 weeks  PLANNED INTERVENTIONS: [id number]- PT Re-evaluation, 97110-Therapeutic exercises, 97530- Therapeutic activity, 97112- Neuromuscular re-education, 97535- Self Care, [id number]- Manual therapy, [id number]- Gait training, [phone]- Orthotic Fit/training, [phone]- Canalith repositioning, [id number]- Aquatic Therapy, [phone]- Splinting, [phone]- Wound care (first 20 sq cm), 97598- Wound care (each additional 20 sq cm)Patient/Family education, Balance training, Stair training, Taping, Dry Needling, Joint mobilization, Joint manipulation, Spinal manipulation, Spinal mobilization, Scar  mobilization, and DME instructions. SABRA  PLAN FOR NEXT SESSION: decompression exercises; postural strengthening; balance; unable to tolerate long periods of standing  Lang Ada, PT, DPT [facility] Office: [phone] 10:31 AM, 11/05/23

## 2023-11-11 ENCOUNTER — Encounter: Payer: Self-pay | Admitting: "Endocrinology

## 2023-11-11 ENCOUNTER — Ambulatory Visit: Admitting: "Endocrinology

## 2023-11-11 VITALS — BP 122/66 | HR 68 | Ht 66.0 in | Wt 165.0 lb

## 2023-11-11 DIAGNOSIS — E039 Hypothyroidism, unspecified: Secondary | ICD-10-CM

## 2023-11-11 DIAGNOSIS — M816 Localized osteoporosis [Lequesne]: Secondary | ICD-10-CM

## 2023-11-11 MED ORDER — LEVOTHYROXINE SODIUM 25 MCG PO TABS: 25.0000 ug | ORAL_TABLET | Freq: Every day | ORAL | 1 refills | Status: DC

## 2023-11-11 NOTE — Progress Notes (Unsigned)
 11/11/2023, 5:14 PM  Endocrinology follow-up note   Subjective:    Patient ID: Erica Gallegos, female    DOB: 1955-03-17, PCP Bevely Doffing, FNP   Past Medical History:  Diagnosis Date   Allergy    Anxiety    Arthritis    Cataract    Removed/both eyes   Chronic kidney disease    Small cyst   Complication of anesthesia    Patient woke up during anesthesia with bunionectomy   GERD (gastroesophageal reflux disease)    HLD (hyperlipidemia)    HTN (hypertension)    Major depressive disorder    Migraine with aura    Neuromuscular disorder (HCC) 2018/approximately   Spinal stenosis   PONV (postoperative nausea and vomiting)    RAS (renal artery stenosis)    Scoliosis    Sleep apnea    Sleep related hypoxia    Spinal stenosis    Past Surgical History:  Procedure Laterality Date   BUNIONECTOMY     CATARACT EXTRACTION     CHOLECYSTECTOMY     COLONOSCOPY WITH PROPOFOL  N/A 12/13/2021   Procedure: COLONOSCOPY WITH PROPOFOL ;  Surgeon: Shaaron Lamar CHRISTELLA, MD;  [facility];  Service: Endoscopy;  Laterality: N/A;  8:00am, asa 2   ESOPHAGOGASTRODUODENOSCOPY (EGD) WITH PROPOFOL  N/A 04/05/2021   Procedure: ESOPHAGOGASTRODUODENOSCOPY (EGD) WITH PROPOFOL ;  Surgeon: Shaaron Lamar CHRISTELLA, MD;  Location: [facility];  Service: Endoscopy;  Laterality: N/A;  10:15am   ESOPHAGOGASTRODUODENOSCOPY (EGD) WITH PROPOFOL  N/A 12/13/2021   Procedure: ESOPHAGOGASTRODUODENOSCOPY (EGD) WITH PROPOFOL ;  Surgeon: Shaaron Lamar CHRISTELLA, MD;  [facility]: [facility];  Service: Endoscopy;  Laterality: N/A;   EYE SURGERY     MALONEY DILATION N/A 04/05/2021   Procedure: MALONEY DILATION;  Surgeon: Shaaron Lamar CHRISTELLA, MD;  [facility]: [facility];  Service: Endoscopy;  Laterality: N/A;   MALONEY DILATION N/A 12/13/2021   Procedure: AGAPITO DILATION;  Surgeon: Shaaron Lamar CHRISTELLA, MD;  Location: [facility];  Service: Endoscopy;  Laterality: N/A;   TUBAL  LIGATION  1987   Social History   Socioeconomic History   Marital status: Married    Spouse name: Not on file   Number of children: 3   Years of education: Not on file   Highest education level: Not on file  Occupational History   Not on file  Tobacco Use   Smoking status: Former    Types: Cigarettes   Smokeless tobacco: Never   Tobacco comments:    She quit smoking in the early 90's  Vaping Use   Vaping status: Former  Substance and Sexual Activity   Alcohol use: Yes    Comment: seldom   Drug use: Not Currently    Types: Marijuana    Comment: CBD cream; Marijuana a couple times a week. helps with her anxiety and migraines.   Sexual activity: Yes    Birth control/protection: None, Post-menopausal  Other Topics Concern   Not on file  Social History Narrative   Lives with her husband, retired.    Social Drivers of Corporate Investment Banker Strain: Low Risk  (08/30/2022)   Overall Financial Resource Strain (CARDIA)    Difficulty of Paying Living Expenses: Not hard at all  Food  Insecurity: No Food Insecurity (08/30/2022)   Hunger Vital Sign    Worried About Running Out of Food in the Last Year: Never true    Ran Out of Food in the Last Year: Never true  Transportation Needs: No Transportation Needs (08/30/2022)   PRAPARE - Administrator, Civil Service (Medical): No    Lack of Transportation (Non-Medical): No  Physical Activity: Sufficiently Active (08/30/2022)   Exercise Vital Sign    Days of Exercise per Week: 7 days    Minutes of Exercise per Session: 60 min  Stress: Stress Concern Present (08/30/2022)   [employer] of Occupational Health - Occupational Stress Questionnaire    Feeling of Stress : To some extent  Social Connections: Moderately Isolated (08/30/2022)   Social Connection and Isolation Panel    Frequency of Communication with Friends and Family: More than three times a week    Frequency of Social Gatherings with Friends and Family: Three  times a week    Attends Religious Services: Never    Active Member of Clubs or Organizations: No    Attends Banker Meetings: Never    Marital Status: Married   Family History  Problem Relation Age of Onset   Cancer Mother    Hypertension Mother    Diabetes Mother    Heart disease Mother    Hyperlipidemia Mother    Cancer Father    Stroke Father    Hypertension Father    Heart disease Father    Hypertension Sister    Lupus Sister    Arthritis Sister    Hypertension Sister    Colon polyps Sister    Cancer Brother    Brain cancer Brother    Non-Hodgkin's lymphoma Brother    Anxiety disorder Daughter    Vision loss Sister    Colon cancer Neg Hx    Outpatient Encounter Medications as of 11/11/2023  Medication Sig   levothyroxine (SYNTHROID) 25 MCG tablet Take 1 tablet (25 mcg total) by mouth daily before breakfast.   oxyCODONE-acetaminophen  (PERCOCET/ROXICET) 5-325 MG tablet Take 1 tablet by mouth every 6 (six) hours as needed.   acetaminophen  (TYLENOL ) 325 MG tablet Take 650 mg by mouth every 6 (six) hours as needed.   atorvastatin  (LIPITOR) 40 MG tablet Take 1 tablet (40 mg total) by mouth daily.   buPROPion  (WELLBUTRIN  XL) 300 MG 24 hr tablet Take 1 tablet (300 mg total) by mouth every morning.   carvedilol  (COREG ) 12.5 MG tablet TAKE 1 TABLET(12.5 MG) BY MOUTH TWICE DAILY   chlorthalidone  (HYGROTON ) 25 MG tablet TAKE 1 AND 1/2 TABLETS(37.5 MG) BY MOUTH DAILY   Cholecalciferol  (VITAMIN D3) 50 MCG (2000 UT) TABS Take 2,000 Units by mouth daily.   clonazePAM  (KLONOPIN ) 0.5 MG tablet Take 1 tablet (0.5 mg total) by mouth at bedtime. (Patient not taking: Reported on 10/16/2023)   cyclobenzaprine  (FLEXERIL ) 10 MG tablet TAKE 1 TABLET BY MOUTH EVERY AT BEDTIME AS NEEDED FOR BACK OR NERVE PAIN   DULoxetine  (CYMBALTA ) 30 MG capsule Take 3 capsules (90 mg total) by mouth daily.   esomeprazole  (NEXIUM ) 20 MG capsule Take 1 capsule (20 mg total) by mouth daily at 12 noon.    ezetimibe  (ZETIA ) 10 MG tablet Take 1 tablet (10 mg total) by mouth daily.   furosemide  (LASIX ) 20 MG tablet Take 1 tablet (20 mg total) by mouth as needed (swelling).   gabapentin  (NEURONTIN ) 300 MG capsule TAKE 1 CAPSULE BY MOUTH EVERY MORNING AND EVERY EVENING  THEN TAKE 2 CAPSULES BY MOUTH AT BEDTIME   hydrALAZINE  (APRESOLINE ) 100 MG tablet Take 1 tablet (100 mg total) by mouth 2 (two) times daily.   hydrOXYzine  (VISTARIL ) 25 MG capsule Take 1 capsule (25 mg total) by mouth every 8 (eight) hours as needed for itching. (Patient not taking: Reported on 10/16/2023)   irbesartan  (AVAPRO ) 300 MG tablet Take 1 tablet (300 mg total) by mouth daily.   Melatonin 10 MG TABS Take 10 mg by mouth at bedtime.   Omega-3 Fatty Acids (FISH OIL) 1000 MG CAPS Take 4,000 mg by mouth daily.   QUEtiapine  (SEROQUEL ) 50 MG tablet Take one tab daily and two at bed time   spironolactone  (ALDACTONE ) 25 MG tablet TAKE 1 TABLET(25 MG) BY MOUTH DAILY   vitamin B-12 (CYANOCOBALAMIN ) 500 MCG tablet Take 500 mcg by mouth daily.   vitamin C (ASCORBIC ACID) 500 MG tablet Take 500 mg by mouth daily.   No facility-administered encounter medications on file as of 11/11/2023.   ALLERGIES: Allergies  Allergen Reactions   Atenolol Swelling   Amlodipine Swelling    VACCINATION STATUS: Immunization History  Administered Date(s) Administered   Fluad Trivalent(High Dose 65+) 09/27/2022   Influenza,inj,Quad PF,6+ Mos 10/13/2020, 10/22/2021   Moderna Covid-19 Vaccine Bivalent Booster 53yrs & up 07/07/2020, 10/16/2020   Moderna SARS-COV2 Booster Vaccination 07/28/2019   Moderna Sars-Covid-2 Vaccination 11/23/2019   PFIZER(Purple Top)SARS-COV-2 Vaccination 03/12/2019, 04/02/2019   PNEUMOCOCCAL CONJUGATE-20 06/18/2022   Tdap 07/24/2021   Zoster Recombinant(Shingrix ) 07/24/2021, 10/22/2021    HPI Erica Gallegos is 68 y.o. female who presents today with a medical history as above. she is being seen in follow-up after she was  seen in consultation for osteoporosis requested by Bevely Doffing, FNP.   She was seen in June 2020 for with a bone density performed in October 2023 which showed a T-score of -2.3 on her hip, -1.7 on femur, and -1.2 on forearms.  Her spine was not included in the study due to degenerative joint disease. Her repeat bone density in August 2024 showed osteoporosis of left femoral neck-see below. Due to estimated higher fracture risk, after a brief intervention with oral Fosamax , she was switched to Reclast  IV.  She received her first treatment in October 2024.  She tolerated this treatment very well.   Due to ongoing mild hypercalcemia, during her last visit, she was offered 24-hour urine collection for calcium  measurement.  She is here today to discuss that results.  This test did not show significant hypercalciuria-24-hour urine calcium  was only 29 mg with 878 mg of urine creatinine.  She did have mildly elevated PTH along with mild hypercalcemia in the prior measurements.  Review of her medical records as far back as September 2022 she did show mild hypercalcemia.  Her previous studies are not available to review.  She moved to this area 4 years ago from [place] .  She denies nephrolithiasis, does not report family history of hypercalcemia which would require surgical treatment.  Her highest PTH documented was 81 in November 2024 associated with normal calcium  of 9.3.       She is accompanied by her husband to clinic.     Patient thinks she has lost some height, does have medical history of scoliosis.  She is on vitamin D  supplements.   Denies any prior history of fragility fractures.  Her other medical  problems include hypertension and hyperlipidemia on treatment. Her intake of dairy products is out of average.  She endorses diffuse body  aches and neuropathy/radiculopathies.  She is more than 15 years postmenopausal, a former smoker.   Review of Systems  Constitutional: + Minimally  fluctuating body weight, + fatigue, no subjective hyperthermia, no subjective hypothermia Eyes: no blurry vision, no xerophthalmia   Objective:       11/11/2023    9:59 AM 10/31/2023   12:24 PM 10/31/2023   11:35 AM  Vitals with BMI  Height 5' 6    Weight 165 lbs    BMI 26.64    Systolic 122 143 866  Diastolic 66 78 80  Pulse 68 57 59    BP 122/66   Pulse 68   Ht 5' 6 (1.676 m)   Wt 165 lb (74.8 kg)   BMI 26.63 kg/m   Wt Readings from Last 3 Encounters:  11/11/23 165 lb (74.8 kg)  09/22/23 165 lb 3.2 oz (74.9 kg)  09/22/23 165 lb (74.8 kg)    Physical Exam  Constitutional:  Body mass index is 26.63 kg/m.,  not in acute distress, normal state of mind Eyes: PERRLA, EOMI, no exophthalmos ENT: moist mucous membranes, no gross thyromegaly, no gross cervical lymphadenopathy  Musculoskeletal: + Mild scoliotic deformity of the spine which was not included in the bone density , strength intact in all four extremities   CMP ( most recent) CMP     Component Value Date/Time   NA 141 10/22/2023 1018   K 5.4 (H) 10/22/2023 1018   CL 109 (H) 10/22/2023 1018   CO2 18 (L) 10/22/2023 1018   GLUCOSE 86 10/22/2023 1018   GLUCOSE 82 12/04/2021 1106   BUN 31 (H) 10/22/2023 1018   CREATININE 1.64 (H) 10/22/2023 1018   CREATININE 1.28 (H) 08/10/2021 1209   CALCIUM  10.8 (H) 10/22/2023 1018   PROT 6.8 10/22/2023 1018   ALBUMIN 4.5 10/22/2023 1018   AST 14 10/22/2023 1018   ALT 16 10/22/2023 1018   ALKPHOS 81 10/22/2023 1018   BILITOT 0.3 10/22/2023 1018   GFRNONAA 36 (L) 12/04/2021 1106    Lipid Panel     Component Value Date/Time   CHOL 160 05/15/2023 0920   TRIG 111 05/15/2023 0920   HDL 54 05/15/2023 0920   CHOLHDL 3.0 05/15/2023 0920   CHOLHDL 2.2 08/10/2021 1209   LDLCALC 86 05/15/2023 0920   LDLCALC 56 08/10/2021 1209   LABVLDL 20 05/15/2023 0920      Lab Results  Component Value Date   TSH 1.490 10/22/2023   TSH 2.440 04/10/2023   TSH 1.120 11/20/2022    TSH 0.972 06/20/2022   TSH 1.720 02/26/2022   TSH 1.260 10/22/2021   TSH 1.45 09/21/2020   TSH 1.93 08/28/2020   FREET4 0.70 (L) 10/22/2023   FREET4 1.20 04/10/2023   FREET4 0.95 11/20/2022   FREET4 0.95 06/20/2022   FREET4 1.00 02/26/2022   FREET4 0.74 (L) 10/22/2021    Bone density latest on August 16, 2022  DualFemur Neck Left 08/16/2022 65.1 Osteoporosis -2.7 0.668 g/cm2 -6.8% Yes DualFemur Neck Left 07/02/2021 64.0 Osteopenia -2.3 0.717 g/cm2 - -   DualFemur Total Mean 08/16/2022 65.1 Osteopenia -1.9 0.764 g/cm2 -3.9% Yes DualFemur Total Mean 07/02/2021 64.0 Osteopenia -1.7 0.795 g/cm2 - -   Left Forearm Radius 33% 08/16/2022 65.1 Osteopenia -1.6 0.596 g/cm2 -4.7% - Left Forearm Radius 33% 07/02/2021 64.0 Osteopenia -1.2 0.626 g/cm2  Assessment & Plan:   1.  Osteoporosis of hip  2.  Hypercalcemia   -I have reviewed her new and available bone density studies as well as  her labs. - Based on these reviews, she has has now bone density findings consistent with osteoporosis of hip, worsening from her prior study in 2023.   Her vertebral spine was not included in the study due to degenerative changes. She also continued to have mild, persistent hypercalcemia.  24-hour urine calcium  was 29 mg / 24 hours against primary hyperparathyroidism.  FHH is not ruled out.  Patient denies any family history of hypercalcemia which  required parathyroidectomy.  No history of nephrolithiasis. Primary hyperparathyroidism is not confirmed at this time.  She will be put on expectant management. She will try to get her prior medical records for review.  She has tolerated her first Reclast  infusion in October 2024.  She will be considered for her next treatment in October 2025.  Surveillance bone density will be requested after August 2025  to assess treatment effect.    Yearly IV Reclast  treatment for 3-6 years with potentially give her a drug holiday depending on her response and if she  develops worsening osteoporosis, she can always be considered for Prolia at the end. If she does not tolerate or does not generate adequate treatment effect, she will be considered for Prolia intervention. I  Shared the pros and cons of this treatment options including the fact that when she started Prolia it cannot be stopped for risk of atypical fracture.    Fall precautions discussed with her.  Her dietary intake of protein seems to be adequate.   Her previsit  thyroid  function test was consistent with euthyroid state.    She is advised to stay away from calcium  supplements,  however continue on vitamin D  supplements.   - she is advised to maintain close follow up with Bevely Doffing, FNP for primary care needs.  I spent  22  minutes in the care of the patient today including review of labs from Thyroid  Function, CMP, and other relevant labs ; imaging/biopsy records (current and previous including abstractions from other facilities); face-to-face time discussing  her lab results and symptoms, medications doses, her options of short and long term treatment based on the latest standards of care / guidelines;   and documenting the encounter.  Erica Gallegos  participated in the discussions, expressed understanding, and voiced agreement with the above plans.  All questions were answered to her satisfaction. she is encouraged to contact clinic should she have any questions or concerns prior to her return visit.  Follow up plan: Return in about 3 months (around 02/11/2024) for 24 Hr Urine Ca & Cr,  F/U with Pre-visit Labs, F/U with Pre-visit Labs.   Ranny Earl, MD [facility] [address] [place], [place] [address] Phone: [phone]  Fax: [phone]     11/11/2023, 5:14 PM  This note was partially dictated with voice recognition software. Similar sounding words can be transcribed inadequately or may not  be corrected upon  review.

## 2023-11-12 DIAGNOSIS — E039 Hypothyroidism, unspecified: Secondary | ICD-10-CM | POA: Insufficient documentation

## 2023-11-13 ENCOUNTER — Encounter (HOSPITAL_COMMUNITY): Payer: Self-pay

## 2023-11-13 ENCOUNTER — Ambulatory Visit (HOSPITAL_COMMUNITY)

## 2023-11-13 DIAGNOSIS — R262 Difficulty in walking, not elsewhere classified: Secondary | ICD-10-CM | POA: Diagnosis not present

## 2023-11-13 DIAGNOSIS — R293 Abnormal posture: Secondary | ICD-10-CM | POA: Diagnosis not present

## 2023-11-13 DIAGNOSIS — M5459 Other low back pain: Secondary | ICD-10-CM | POA: Insufficient documentation

## 2023-11-13 DIAGNOSIS — R2689 Other abnormalities of gait and mobility: Secondary | ICD-10-CM | POA: Diagnosis not present

## 2023-11-13 NOTE — Therapy (Signed)
 OUTPATIENT PHYSICAL THERAPY THORACOLUMBAR TREATMENT   Patient Name: Erica Gallegos MRN: [id number] DOB:26-Feb-1955, 68 y.o., female Today's Date: 11/13/2023  END OF SESSION:  PT End of Session - 11/13/23 0819     Visit Number 4    Number of Visits 8    Date for Recertification  11/19/23    Authorization Type BCBS Medicare    Authorization Time Period no auth needed    PT Start Time 0820    PT Stop Time 0900    PT Time Calculation (min) 40 min    Activity Tolerance Patient tolerated treatment well    Behavior During Therapy WFL for tasks assessed/performed            Past Medical History:  Diagnosis Date   Allergy    Anxiety    Arthritis    Cataract    Removed/both eyes   Chronic kidney disease    Small cyst   Complication of anesthesia    Patient woke up during anesthesia with bunionectomy   GERD (gastroesophageal reflux disease)    HLD (hyperlipidemia)    HTN (hypertension)    Major depressive disorder    Migraine with aura    Neuromuscular disorder (HCC) 2018/approximately   Spinal stenosis   PONV (postoperative nausea and vomiting)    RAS (renal artery stenosis)    Scoliosis    Sleep apnea    Sleep related hypoxia    Spinal stenosis    Past Surgical History:  Procedure Laterality Date   BUNIONECTOMY     CATARACT EXTRACTION     CHOLECYSTECTOMY     COLONOSCOPY WITH PROPOFOL  N/A 12/13/2021   Procedure: COLONOSCOPY WITH PROPOFOL ;  Surgeon: Shaaron Lamar CHRISTELLA, MD;  [facility];  Service: Endoscopy;  Laterality: N/A;  8:00am, asa 2   ESOPHAGOGASTRODUODENOSCOPY (EGD) WITH PROPOFOL  N/A 04/05/2021   Procedure: ESOPHAGOGASTRODUODENOSCOPY (EGD) WITH PROPOFOL ;  Surgeon: Shaaron Lamar CHRISTELLA, MD;  [facility];  Service: Endoscopy;  Laterality: N/A;  10:15am   ESOPHAGOGASTRODUODENOSCOPY (EGD) WITH PROPOFOL  N/A 12/13/2021   Procedure: ESOPHAGOGASTRODUODENOSCOPY (EGD) WITH PROPOFOL ;  Surgeon: Shaaron Lamar CHRISTELLA, MD;  [facility];  Service:  Endoscopy;  Laterality: N/A;   EYE SURGERY     MALONEY DILATION N/A 04/05/2021   Procedure: MALONEY DILATION;  Surgeon: Shaaron Lamar CHRISTELLA, MD;  [facility];  Service: Endoscopy;  Laterality: N/A;   MALONEY DILATION N/A 12/13/2021   Procedure: AGAPITO DILATION;  Surgeon: Shaaron Lamar CHRISTELLA, MD;  Location: [facility];  Service: Endoscopy;  Laterality: N/A;   TUBAL LIGATION  1987   Patient Active Problem List   Diagnosis Date Noted   Hypothyroidism 11/12/2023   Solitary pulmonary nodule on lung CT 09/22/2023   Chronic kidney disease, stage 3b (HCC) 05/15/2023   Itching 12/26/2022   PTSD (post-traumatic stress disorder) 11/12/2022   Abnormal thyroid  blood test 06/27/2022   Hypercalcemia 06/27/2022   Need for pneumococcal 20-valent conjugate vaccination 06/18/2022   Anxiety and depression 02/26/2022   OSA on CPAP 02/26/2022   Ocular pain, right eye 02/26/2022   Need for influenza vaccination 10/23/2021   Need for shingles vaccine 10/23/2021   Memory problem 08/21/2021   Osteoporosis 07/24/2021   Vitamin D  deficiency 07/24/2021   Neuropathy 03/22/2021   Hyperlipidemia 03/22/2021   Spinal stenosis 03/22/2021   Fall 03/22/2021   Right shoulder pain 03/22/2021   Left knee pain 03/22/2021   Encounter for screening fecal occult blood testing 08/28/2020   Encounter for gynecological examination  with Papanicolaou smear of cervix 08/28/2020   Dysphagia 03/02/2020   Essential hypertension 03/02/2020   Nausea without vomiting 03/02/2020   Gastroesophageal reflux disease 03/02/2020    PCP: Melvenia Mungo, MD  REFERRING PROVIDER: Darnella Dorn SAUNDERS, MD  REFERRING DIAG: M41.9 (ICD-10-CM) - Scoliosis, unspecified  Rationale for Evaluation and Treatment: Rehabilitation  THERAPY DIAG:  Other low back pain  Difficulty in walking, not elsewhere classified  Abnormal posture  Other abnormalities of gait and mobility  ONSET DATE: back pain for years  SUBJECTIVE:                                                                                                                                                                                            SUBJECTIVE STATEMENT: 11/13/23:  Feeling good today, no reports of pain.  Has some numbness from Rt shin that goes up to Rt thigh.  Would like to work on standing tolerance activities today.  Eval: Arrives with rollater; I need balance too; referred for scoliosis and also has spinal stenosis; takes reclast  for osteoporosis.  My goal is to have surgery done.  Originally injured her back in the 31's.  Worst pain in the last 3 years; was originally seeing Dr. Carollee.  Now seeing Dr. Darnella who referred for therapy.  If she has surgery will be at [facility] or [facility]; has had to start using a rollator over the past year.  MD told me it (therapy) would not help  PERTINENT HISTORY:  Anxiety, HTN, migraine, osteoporosis, PTSD; spinal stenosis  PAIN:  Are you having pain? Yes: NPRS scale: 3/10; 3/10 at best; 10/10 at worst Pain location: low back and but now up to right shoulder and arm, right leg can give out Pain description: aching sore constant but can worsen with activity Aggravating factors: standing and walking, turning over in bed Relieving factors: sit, rest  PRECAUTIONS: Fall  RED FLAGS: None   WEIGHT BEARING RESTRICTIONS: No  FALLS:  Has patient fallen in last 6 months? Yes. Number of falls 1  LIVING ENVIRONMENT: Lives with: lives with their spouse Lives in: House/apartment Stairs: No ramp entry Has following equipment at home: Vannie - 4 wheeled  OCCUPATION: retired  PLOF: Independent with household mobility with device and Independent with community mobility with device  PATIENT GOALS: to have surgery done  NEXT MD VISIT: PRN  OBJECTIVE:  Note: Objective measures were completed at Evaluation unless otherwise noted.  DIAGNOSTIC FINDINGS:  DEGENERATIVE CHANGES: Moderate disc height loss at C5-6  and C6-7. Small central disc-osteophyte complex at C6-7. Unchanged severe right neural foraminal narrowing at C6-7. Unchanged moderate right and  severe left neural foraminal narrowing at C5-6 due to facet arthropathy and uncovertebral joint spurring.I  IMPRESSION: 1. Severe spinal canal stenosis at L1-2, L2-3, and L4-5 levels. 2. Severe right neural foraminal narrowing at L2-3 and severe bilateral neural foraminal narrowing at L4-5. 3. Levoscoliotic curvature of the lumbar spine with 7 mm left lateral listhesis of L3 on L4 and 10 mm right lateral listhesis of L1 on L2. 4. No significant spinal canal stenosis in the cervical or thoracic spine. 5. Moderate right and severe left neural foraminal narrowing at C5-6. 6. Severe right neural foraminal narrowing at C6-7. 7. 10 mm solid lung nodule in the left lower lobe. Per Fleischner society recommendations, consider CT, PET/CT, or tissue sampling at 3 months.  PATIENT SURVEYS:  Modified Oswestry:  MODIFIED OSWESTRY DISABILITY SCALE  Date: 10/22/23 Score                                Total 28/50; 56%   Interpretation of scores: Score Category Description  0-20% Minimal Disability The patient can cope with most living activities. Usually no treatment is indicated apart from advice on lifting, sitting and exercise  21-40% Moderate Disability The patient experiences more pain and difficulty with sitting, lifting and standing. Travel and social life are more difficult and they may be disabled from work. Personal care, sexual activity and sleeping are not grossly affected, and the patient can usually be managed by conservative means  41-60% Severe Disability Pain remains the main problem in this group, but activities of daily living are affected. These patients require a detailed investigation  61-80% Crippled Back pain impinges on all aspects of the patient's life. Positive intervention is required  81-100% Bed-bound These patients are  either bed-bound or exaggerating their symptoms  Bluford FORBES Zoe DELENA Karon DELENA, et al. Surgery versus conservative management of stable thoracolumbar fracture: the PRESTO feasibility RCT. [place] ([place]): [employer]; 2021 Nov. [facility] Technology Assessment, No. 25.62.) Appendix 3, Oswestry Disability Index category descriptors. Available from: Findjewelers.cz  Minimally Clinically Important Difference (MCID) = 12.8%  COGNITION: Overall cognitive status: Within functional limits for tasks assessed     SENSATION: Both fingers go numb sometimes  MUSCLE LENGTH: Hamstrings: test next visit POSTURE: forward head, increased thoracic kyphosis, and flexed trunk   PALPATION: Right shoulder elevated; right rib hump; tenderness mid back and low back noted  LUMBAR ROM:  AROM eval  Flexion 65, tightness in hamstinrgs, BUE support on walker  Extension 15  Right lateral flexion 6  Left lateral flexion 11  Right rotation Unable, legs giving out  Left rotation Unable, legs giving out   (Blank rows = not tested)  LOWER EXTREMITY ROM:     Active  Right eval Left eval  Hip flexion    Hip extension    Hip abduction    Hip adduction    Hip internal rotation    Hip external rotation    Knee flexion    Knee extension    Ankle dorsiflexion    Ankle plantarflexion    Ankle inversion    Ankle eversion     (Blank rows = not tested)  LOWER EXTREMITY MMT:    MMT Right eval Left eval  Hip flexion 3+ 4  Hip extension    Hip abduction    Hip adduction    Hip internal rotation    Hip external rotation    Knee flexion  Knee extension 3+ 4+  Ankle dorsiflexion 4- 4+  Ankle plantarflexion    Ankle inversion    Ankle eversion     (Blank rows = not tested)   FUNCTIONAL TESTS:  5 times sit to stand: 58.21 sec SLS unable   GAIT: Distance walked: 50 ft in clinic Assistive device utilized: Walker - 4 wheeled Level of assistance: Modified  independence Comments: flexed trunk; slow gait speed  TREATMENT DATE:  11/13/23: - Nustep, 5 minutes, [place] trail level 1 resistance, pt cued for 50-60 spm - STS 5x no HHA, cueing for mechanics - Toe tapping 6in step height - Sidestep 2RT inside // bars with HHA RTB Supine: - LTR on red exercise ball 10x - DKTC on green exercise ball, pt cued for smooth and controlled movement, 2 set of 10 reps  11/05/2023  Therapeutic Exercise: -Nustep, 5 minutes, level 1 resistance, pt cued for 50-60 spm -Clamshells, 2 sets of 10 reps, RTB at knees second set both sides (only 5 on second set) -Supine bridges 2 sets of 5 reps, 3 second holds, symptomatic, pt cued for max hip extension, RTB at knees -DKTC on green exercise ball, pt cued for smooth and controlled movement, 2 set of 10 reps -Hip 3 way kicks, 1 set of 3 reps, 3lb ankle weights, pt cued for form and sequencing Neuromuscular Re-education: -Walking marches, 1 lap on 20 foot line, 3lb ankle weights, pt cued for use of AD and increased hip ROM -LTR on green exercise ball, 2 set of 10 reps bilaterally, pt cued to remain in pain free ROM Therapeutic Activity: -Sit to stands with 5 lb kettle bell and shoulder press, 2 sets of 3 reps, pt cued for core activation   10/29/2023  -Lumbar ROM obtained (pts standing tolerance limited) -Moist heat pack applied in supine during plinth activities 15 minutes -ROM assessed Therapeutic Exercise: -Supine bridges 2 sets of 5 reps, 3 second holds, symptomatic, pt cued for max hip extension -Hook lying clamshells, 2 sets of 8 reps, 3 second holds, RTB at knees  -SLR, partial ROM due to pain, 1 set of 8 reps, pt cued for core activation throughout Neuromuscular Re-education: -PPT 2 set of 10 reps, 3 second holds, pr cued to remain in pain free ROM -LTR 2 set of 10 reps bilaterally, pt cued to remain in pain free ROM   10/21/23 physical therapy evaluation and HEP instruction                                                                                                                                  PATIENT EDUCATION:  Education details: Patient educated on exam findings, POC, scope of PT, HEP, and what to expect next visit. Person educated: Patient Education method: Explanation, Demonstration, and Handouts Education comprehension: verbalized understanding, returned demonstration, verbal cues required, and tactile cues required  HOME EXERCISE PROGRAM: 10/22/23: Decompression exercises 1-5  Access Code: [email or web address] URL: [email or web address] Date:  10/22/2023 Prepared by: [facility]  Exercises - Sitting to Supine Roll  - 1 x daily - 7 x weekly - 1 sets - 1 reps  ASSESSMENT:  CLINICAL IMPRESSION: Session focus with core and proximal strengthening and stretches for mobility to assist with pain control.  Pt continues to be limited with standing tolerance stated increased radicular symptoms with standing for 15-20 minutes.  Added resistance walking and SLS based activities for gluteal and core strengthening with HHA.  Pt slow mechanics and need for hand held assistance during standing exercises.  Pt reports decreased radicular symptoms following flexion based stretches at EOS.     Eval: Patient is a 68 y.o. female who was seen today for physical therapy evaluation and treatment for M41.9 (ICD-10-CM) - Scoliosis, unspecified.  Patient demonstrates muscle weakness, reduced ROM, and fascial restrictions which are likely contributing to symptoms of pain and are negatively impacting patient ability to perform ADLs and functional mobility tasks. Patient will benefit from skilled physical therapy services to address these deficits to reduce pain and improve level of function with ADLs and functional mobility tasks.   OBJECTIVE IMPAIRMENTS: Abnormal gait, decreased activity tolerance, decreased balance, decreased ROM, decreased strength, impaired perceived functional ability, and pain.    ACTIVITY LIMITATIONS: carrying, lifting, bending, sitting, standing, sleeping, transfers, bed mobility, and locomotion level  PARTICIPATION LIMITATIONS: meal prep, cleaning, laundry, shopping, and community activity  REHAB POTENTIAL: Good  CLINICAL DECISION MAKING: Evolving/moderate complexity  EVALUATION COMPLEXITY: Moderate   GOALS: Goals reviewed with patient? Yes  SHORT TERM GOALS: Target date: 11/05/2023  patient will be independent with initial HEP  Baseline: Goal status: INITIAL  2.  Patient will report 30% improvement overall  Baseline:  Goal status: INITIAL  LONG TERM GOALS: Target date: 11/19/23  Patient will be independent in self management strategies to improve quality of life and functional outcomes.  Baseline:  Goal status: INITIAL  2.  Patient will report 50% improvement overall  Baseline:  Goal status: INITIAL  3.  Patient will improve Modified Oswestry score by 8 points (20/50 or less)  to demonstrate improved perceived function  Baseline: 28/50 Goal status: INITIAL  4.  Patient will improve 5 times sit to stand score to 15 sec or less to demonstrate improved functional mobility and increased leg strength.    Baseline: 58.2 sec Goal status: INITIAL  5.   Patient will increase right leg MMT's to 4+ to 5/5 to allow navigation of steps without gait deviation or loss of balance  Baseline:  Goal status: INITIAL  6.  Patient will able to stand SLS on each leg x 5 to demonstrate improved functional balance  Baseline:  Goal status: INITIAL  PLAN:  PT FREQUENCY: 2x/week  PT DURATION: 4 weeks  PLANNED INTERVENTIONS: 97164- PT Re-evaluation, 97110-Therapeutic exercises, 97530- Therapeutic activity, 97112- Neuromuscular re-education, 97535- Self Care, [id number]- Manual therapy, [id number]- Gait training, [phone]- Orthotic Fit/training, [phone]- Canalith repositioning, [id number]- Aquatic Therapy, [phone]- Splinting, [phone]- Wound care (first 20 sq cm), 97598-  Wound care (each additional 20 sq cm)Patient/Family education, Balance training, Stair training, Taping, Dry Needling, Joint mobilization, Joint manipulation, Spinal manipulation, Spinal mobilization, Scar mobilization, and DME instructions. SABRA  PLAN FOR NEXT SESSION: decompression exercises; postural strengthening; balance; unable to tolerate long periods of standing  Augustin Mclean, LPTA/CLT; CBIS [phone]  12:46 PM, 11/13/23

## 2023-11-14 NOTE — Progress Notes (Signed)
 Erica Gallegos                                          MRN: [id number]   11/14/2023   The [facility] Quality Team Specialist reviewed this patient medical record for the purposes of chart review for care gap closure. The following were reviewed: chart review for care gap closure-controlling blood pressure.    [facility] Quality Team

## 2023-11-17 ENCOUNTER — Ambulatory Visit (INDEPENDENT_AMBULATORY_CARE_PROVIDER_SITE_OTHER)

## 2023-11-17 VITALS — BP 113/74 | HR 62 | Ht 66.0 in | Wt 163.0 lb

## 2023-11-17 DIAGNOSIS — N1832 Chronic kidney disease, stage 3b: Secondary | ICD-10-CM

## 2023-11-17 DIAGNOSIS — E782 Mixed hyperlipidemia: Secondary | ICD-10-CM

## 2023-11-17 DIAGNOSIS — M810 Age-related osteoporosis without current pathological fracture: Secondary | ICD-10-CM

## 2023-11-17 DIAGNOSIS — Z23 Encounter for immunization: Secondary | ICD-10-CM | POA: Diagnosis not present

## 2023-11-17 DIAGNOSIS — I10 Essential (primary) hypertension: Secondary | ICD-10-CM

## 2023-11-17 NOTE — Progress Notes (Unsigned)
 Established Patient Office Visit  Subjective   Patient ID: Erica Gallegos, female    DOB: [birth year]  Age: 68 y.o. MRN: [id number]  Chief Complaint  Patient presents with   Medical Management of Chronic Issues     6 Month follow up    HPI Discussed the use of AI scribe software for clinical note transcription with the patient, who gave verbal consent to proceed.  History of Present Illness    Erica Gallegos is a 68 year old female who presents for a follow-up visit and to discuss her Medicare physical.  Renal function monitoring - Lisinopril  discontinued due to concerns regarding kidney function. - 24-hour urine test completed in October and another submitted today. - Additional 24-hour urine test and labs planned for February.  Osteoporosis management - Received Reclast  infusions twice for osteoporosis. - Previously informed she would receive Reclast  for three years. - Current consideration to switch to Prolia, administered as an injection every six months.  Oncologic surveillance - PET scan completed; uncertain about follow-up schedule. - CT scan scheduled for March.  Preventive gynecologic and breast health - Plans to schedule OB GYN appointment for Pap smear and mammogram.     Patient Active Problem List   Diagnosis Date Noted   Hypothyroidism 11/12/2023   Solitary pulmonary nodule on lung CT 09/22/2023   Stage 3b chronic kidney disease (HCC) 05/15/2023   Itching 12/26/2022   PTSD (post-traumatic stress disorder) 11/12/2022   Abnormal thyroid  blood test 06/27/2022   Hypercalcemia 06/27/2022   Need for pneumococcal 20-valent conjugate vaccination 06/18/2022   Anxiety and depression 02/26/2022   OSA on CPAP 02/26/2022   Ocular pain, right eye 02/26/2022   Need for influenza vaccination 10/23/2021   Need for shingles vaccine 10/23/2021   Memory problem 08/21/2021   Osteoporosis 07/24/2021   Vitamin D  deficiency 07/24/2021   Neuropathy  03/22/2021   Hyperlipidemia 03/22/2021   Spinal stenosis 03/22/2021   Fall 03/22/2021   Right shoulder pain 03/22/2021   Left knee pain 03/22/2021   Encounter for screening fecal occult blood testing 08/28/2020   Encounter for gynecological examination with Papanicolaou smear of cervix 08/28/2020   Dysphagia 03/02/2020   Hypertension 03/02/2020   Nausea without vomiting 03/02/2020   Gastroesophageal reflux disease 03/02/2020      ROS    Objective:     BP 113/74   Pulse 62   Ht 5' 6 (1.676 m)   Wt 163 lb (73.9 kg)   SpO2 (!) 88%   BMI 26.31 kg/m  BP Readings from Last 3 Encounters:  11/17/23 113/74  11/11/23 122/66  10/31/23 (!) 143/78   Wt Readings from Last 3 Encounters:  11/17/23 163 lb (73.9 kg)  11/11/23 165 lb (74.8 kg)  09/22/23 165 lb 3.2 oz (74.9 kg)     Physical Exam Vitals and nursing note reviewed.  Constitutional:      Appearance: Normal appearance.  HENT:     Head: Normocephalic.  Eyes:     Extraocular Movements: Extraocular movements intact.     Pupils: Pupils are equal, round, and reactive to light.  Cardiovascular:     Rate and Rhythm: Normal rate and regular rhythm.  Pulmonary:     Effort: Pulmonary effort is normal.     Breath sounds: Normal breath sounds.  Musculoskeletal:     Cervical back: Normal range of motion and neck supple.  Neurological:     Mental Status: She is alert and oriented to person, place, and time.  Psychiatric:        Mood and Affect: Mood normal.        Thought Content: Thought content normal.    No results found for any visits on 11/17/23.    The 10-year ASCVD risk score (Arnett DK, et al., 2019) is: 5.9%    Assessment & Plan:   Problem List Items Addressed This Visit       Cardiovascular and Mediastinum   Hypertension - Primary   Discontinued lisinopril  due to kidney concerns. Blood pressure well-controlled.        Musculoskeletal and Integument   Osteoporosis   Transitioned to Prolia due to  kidney concerns with Reclast . - Transitioned from Reclast  to Prolia.        Genitourinary   Stage 3b chronic kidney disease (HCC)   Transitioned to Prolia due to potential kidney impact from Reclast . - Continue monitoring kidney function. - Transitioned from Reclast  to Prolia.        Other   Hyperlipidemia   She is currently prescribed atorvastatin  40 mg daily and ezetimibe  10 mg daily. Recent labs showed good control of lipids.       Other Visit Diagnoses       Encounter for immunization       Relevant Orders   Flu vaccine HIGH DOSE PF(Fluzone Trivalent) (Completed)       Return in about 6 months (around 05/16/2024) for chronic follow-up with PCP.    Leita Longs, FNP

## 2023-11-18 ENCOUNTER — Encounter (HOSPITAL_COMMUNITY): Payer: Self-pay

## 2023-11-18 ENCOUNTER — Ambulatory Visit (HOSPITAL_COMMUNITY)

## 2023-11-18 DIAGNOSIS — R2689 Other abnormalities of gait and mobility: Secondary | ICD-10-CM | POA: Diagnosis not present

## 2023-11-18 DIAGNOSIS — M5459 Other low back pain: Secondary | ICD-10-CM | POA: Diagnosis not present

## 2023-11-18 DIAGNOSIS — R293 Abnormal posture: Secondary | ICD-10-CM | POA: Diagnosis not present

## 2023-11-18 DIAGNOSIS — R262 Difficulty in walking, not elsewhere classified: Secondary | ICD-10-CM | POA: Diagnosis not present

## 2023-11-18 LAB — CREATININE, URINE, 24 HOUR
Creatinine, 24H Ur: 701 mg/(24.h) — ABNORMAL LOW (ref 800–1800)
Creatinine, Urine: 87.6 mg/dL

## 2023-11-18 NOTE — Therapy (Addendum)
 OUTPATIENT PHYSICAL THERAPY THORACOLUMBAR TREATMENT Progress Note Reporting Period 10/15 to 11/18/23  See note below for Objective Data and Assessment of Progress/Goals.      Patient Name: Erica Gallegos MRN: [id number] DOB:October 30, 1955, 68 y.o., female Today's Date: 11/24/2023  END OF SESSION:   11/18/23 0903  PT Visits / Re-Eval  Visit Number 5  Number of Visits 16  Date for Recertification  12/19/23  Authorization  Authorization Type [employer] Medicare  Authorization Time Period no auth needed  PT Time Calculation  PT Start Time 0903  PT Stop Time 0946  PT Time Calculation (min) 43 min  PT - End of Session  Activity Tolerance Patient tolerated treatment well  Behavior During Therapy WFL for tasks assessed/performed       Past Medical History:  Diagnosis Date   Allergy    Anxiety    Arthritis    Cataract    Removed/both eyes   Chronic kidney disease    Small cyst   Complication of anesthesia    Patient woke up during anesthesia with bunionectomy   GERD (gastroesophageal reflux disease)    HLD (hyperlipidemia)    HTN (hypertension)    Major depressive disorder    Migraine with aura    Neuromuscular disorder (HCC) 2018/approximately   Spinal stenosis   PONV (postoperative nausea and vomiting)    RAS (renal artery stenosis)    Scoliosis    Sleep apnea    Sleep related hypoxia    Spinal stenosis    Past Surgical History:  Procedure Laterality Date   BUNIONECTOMY     CATARACT EXTRACTION     CHOLECYSTECTOMY     COLONOSCOPY WITH PROPOFOL  N/A 12/13/2021   Procedure: COLONOSCOPY WITH PROPOFOL ;  Surgeon: Shaaron Lamar CHRISTELLA, MD;  [facility];  Service: Endoscopy;  Laterality: N/A;  8:00am, asa 2   ESOPHAGOGASTRODUODENOSCOPY (EGD) WITH PROPOFOL  N/A 04/05/2021   Procedure: ESOPHAGOGASTRODUODENOSCOPY (EGD) WITH PROPOFOL ;  Surgeon: Shaaron Lamar CHRISTELLA, MD;  [facility];  Service: Endoscopy;  Laterality: N/A;  10:15am   ESOPHAGOGASTRODUODENOSCOPY  (EGD) WITH PROPOFOL  N/A 12/13/2021   Procedure: ESOPHAGOGASTRODUODENOSCOPY (EGD) WITH PROPOFOL ;  Surgeon: Shaaron Lamar CHRISTELLA, MD;  [facility];  Service: Endoscopy;  Laterality: N/A;   EYE SURGERY     MALONEY DILATION N/A 04/05/2021   Procedure: MALONEY DILATION;  Surgeon: Shaaron Lamar CHRISTELLA, MD;  [facility];  Service: Endoscopy;  Laterality: N/A;   MALONEY DILATION N/A 12/13/2021   Procedure: AGAPITO DILATION;  Surgeon: Shaaron Lamar CHRISTELLA, MD;  [facility]: [facility];  Service: Endoscopy;  Laterality: N/A;   TUBAL LIGATION  1987   Patient Active Problem List   Diagnosis Date Noted   Hypothyroidism 11/12/2023   Solitary pulmonary nodule on lung CT 09/22/2023   Stage 3b chronic kidney disease (HCC) 05/15/2023   Itching 12/26/2022   PTSD (post-traumatic stress disorder) 11/12/2022   Abnormal thyroid  blood test 06/27/2022   Hypercalcemia 06/27/2022   Need for pneumococcal 20-valent conjugate vaccination 06/18/2022   Anxiety and depression 02/26/2022   OSA on CPAP 02/26/2022   Ocular pain, right eye 02/26/2022   Need for influenza vaccination 10/23/2021   Need for shingles vaccine 10/23/2021   Memory problem 08/21/2021   Osteoporosis 07/24/2021   Vitamin D  deficiency 07/24/2021   Neuropathy 03/22/2021   Hyperlipidemia 03/22/2021   Spinal stenosis 03/22/2021   Fall 03/22/2021   Right shoulder pain 03/22/2021   Left knee pain 03/22/2021   Encounter for screening fecal occult blood  testing 08/28/2020   Encounter for gynecological examination with Papanicolaou smear of cervix 08/28/2020   Dysphagia 03/02/2020   Hypertension 03/02/2020   Nausea without vomiting 03/02/2020   Gastroesophageal reflux disease 03/02/2020    PCP: Melvenia Mungo, MD  REFERRING PROVIDER: Darnella Dorn SAUNDERS, MD  REFERRING DIAG: M41.9 (ICD-10-CM) - Scoliosis, unspecified  Rationale for Evaluation and Treatment: Rehabilitation  THERAPY DIAG:  Other low back pain - Plan: PT plan of care  cert/re-cert  Difficulty in walking, not elsewhere classified - Plan: PT plan of care cert/re-cert  Abnormal posture - Plan: PT plan of care cert/re-cert  Other abnormalities of gait and mobility - Plan: PT plan of care cert/re-cert  ONSET DATE: back pain for years  SUBJECTIVE:                                                                                                                                                                                           SUBJECTIVE STATEMENT: 11/18/23:  Reports soreness following last session, pain scale Rt shin to thigh shooting pain.  Would like to learn how to get up off floor to feed the kitties.  Eval: Arrives with rollater; I need balance too; referred for scoliosis and also has spinal stenosis; takes reclast  for osteoporosis.  My goal is to have surgery done.  Originally injured her back in the 48's.  Worst pain in the last 3 years; was originally seeing Dr. Carollee.  Now seeing Dr. Darnella who referred for therapy.  If she has surgery will be at [facility] or [facility]; has had to start using a rollator over the past year.  MD told me it (therapy) would not help  PERTINENT HISTORY:  Anxiety, HTN, migraine, osteoporosis, PTSD; spinal stenosis  PAIN:  Are you having pain? Yes: NPRS scale: 3/10; 3/10 at best; 10/10 at worst Pain location: low back and but now up to right shoulder and arm, right leg can give out Pain description: aching sore constant but can worsen with activity Aggravating factors: standing and walking, turning over in bed Relieving factors: sit, rest  PRECAUTIONS: Fall  RED FLAGS: None   WEIGHT BEARING RESTRICTIONS: No  FALLS:  Has patient fallen in last 6 months? Yes. Number of falls 1  LIVING ENVIRONMENT: Lives with: lives with their spouse Lives in: House/apartment Stairs: No ramp entry Has following equipment at home: Vannie - 4 wheeled  OCCUPATION: retired  PLOF: Independent with household mobility with  device and Independent with community mobility with device  PATIENT GOALS: to have surgery done  NEXT MD VISIT: PRN  OBJECTIVE:  Note: Objective measures were completed at Evaluation unless otherwise  noted.  DIAGNOSTIC FINDINGS:  DEGENERATIVE CHANGES: Moderate disc height loss at C5-6 and C6-7. Small central disc-osteophyte complex at C6-7. Unchanged severe right neural foraminal narrowing at C6-7. Unchanged moderate right and severe left neural foraminal narrowing at C5-6 due to facet arthropathy and uncovertebral joint spurring.I  IMPRESSION: 1. Severe spinal canal stenosis at L1-2, L2-3, and L4-5 levels. 2. Severe right neural foraminal narrowing at L2-3 and severe bilateral neural foraminal narrowing at L4-5. 3. Levoscoliotic curvature of the lumbar spine with 7 mm left lateral listhesis of L3 on L4 and 10 mm right lateral listhesis of L1 on L2. 4. No significant spinal canal stenosis in the cervical or thoracic spine. 5. Moderate right and severe left neural foraminal narrowing at C5-6. 6. Severe right neural foraminal narrowing at C6-7. 7. 10 mm solid lung nodule in the left lower lobe. Per Fleischner society recommendations, consider CT, PET/CT, or tissue sampling at 3 months.  PATIENT SURVEYS:  Modified Oswestry:  MODIFIED OSWESTRY DISABILITY SCALE   Date: 10/22/23 Score Date: 11/18/23  Pain intensity  3 =  Pain medication provides me with moderate relief from pain.  2. Personal care (washing, dressing, etc.)  1 =  I can take care of myself normally, but it increases my pain.  3. Lifting  3 = Pain prevents me from lifting heavy weights, but I can manage light to medium weights if they are conveniently positioned  4. Walking  3 =  Pain prevents me from walking more than  mile.  5. Sitting  2 =  Pain prevents me from sitting more than 1 hour.  6. Standing  3 =  Pain prevents me from standing more than 1/2 hour.  7. Sleeping  1 = I can sleep well only by using pain  medication.  8. Social Life  2 = Pain prevents me from participating in more energetic activities (eg. sports, dancing).  9. Traveling  1 =  I can travel anywhere, but it increases my pain.  10. Employment/ Homemaking  3 = Pain prevents me from doing anything but light duties.  Total 28/50; 56% 22/50= 44%   Interpretation of scores: Score Category Description  0-20% Minimal Disability The patient can cope with most living activities. Usually no treatment is indicated apart from advice on lifting, sitting and exercise  21-40% Moderate Disability The patient experiences more pain and difficulty with sitting, lifting and standing. Travel and social life are more difficult and they may be disabled from work. Personal care, sexual activity and sleeping are not grossly affected, and the patient can usually be managed by conservative means  41-60% Severe Disability Pain remains the main problem in this group, but activities of daily living are affected. These patients require a detailed investigation  61-80% Crippled Back pain impinges on all aspects of the patient's life. Positive intervention is required  81-100% Bed-bound These patients are either bed-bound or exaggerating their symptoms  Bluford FORBES Zoe DELENA Karon DELENA, et al. Surgery versus conservative management of stable thoracolumbar fracture: the PRESTO feasibility RCT. [place] ([place]): [employer]; 2021 Nov. [facility] Technology Assessment, No. 25.62.) Appendix 3, Oswestry Disability Index category descriptors. Available from: Findjewelers.cz  Minimally Clinically Important Difference (MCID) = 12.8%  COGNITION: Overall cognitive status: Within functional limits for tasks assessed     SENSATION: Both fingers go numb sometimes  MUSCLE LENGTH: Hamstrings: test next visit POSTURE: forward head, increased thoracic kyphosis, and flexed trunk   PALPATION: Right shoulder elevated; right rib hump;  tenderness mid  back and low back noted  LUMBAR ROM:  AROM eval  Flexion 65, tightness in hamstinrgs, BUE support on walker  Extension 15  Right lateral flexion 6  Left lateral flexion 11  Right rotation Unable, legs giving out  Left rotation Unable, legs giving out   (Blank rows = not tested)  LOWER EXTREMITY ROM:     Active  Right eval Left eval  Hip flexion    Hip extension    Hip abduction    Hip adduction    Hip internal rotation    Hip external rotation    Knee flexion    Knee extension    Ankle dorsiflexion    Ankle plantarflexion    Ankle inversion    Ankle eversion     (Blank rows = not tested)  LOWER EXTREMITY MMT:    MMT Right eval Left eval Right 11/18/23` Left 11/18/23  Hip flexion 3+ 4 4- 4+  Hip extension   3/5 3+/5  Hip abduction      Hip adduction      Hip internal rotation      Hip external rotation      Knee flexion      Knee extension 3+ 4+ 4/5 4+  Ankle dorsiflexion 4- 4+ 4/5 4+  Ankle plantarflexion      Ankle inversion      Ankle eversion       (Blank rows = not tested)   FUNCTIONAL TESTS:  5 times sit to stand: 58.21 sec SLS unable  11/18/23:  216 with rollator, no increased pain 5STS: 36.23 SLS Rt 1, Lt 3  GAIT: Distance walked: 50 ft in clinic Assistive device utilized: Walker - 4 wheeled Level of assistance: Modified independence Comments: flexed trunk; slow gait speed  TREATMENT DATE:  11/18/23: 268ft with rollator, no increased pain 5STS 36.23 MMT see above SLS Rt 1, Lt 3 Modified Oswestry: see above  11/13/23: - Nustep, 5 minutes, [place] trail level 1 resistance, pt cued for 50-60 spm - STS 5x no HHA, cueing for mechanics - Toe tapping 6in step height - Sidestep 2RT inside // bars with HHA RTB Supine: - LTR on red exercise ball 10x - DKTC on green exercise ball, pt cued for smooth and controlled movement, 2 set of 10 reps  11/05/2023  Therapeutic Exercise: -Nustep, 5 minutes, level 1  resistance, pt cued for 50-60 spm -Clamshells, 2 sets of 10 reps, RTB at knees second set both sides (only 5 on second set) -Supine bridges 2 sets of 5 reps, 3 second holds, symptomatic, pt cued for max hip extension, RTB at knees -DKTC on green exercise ball, pt cued for smooth and controlled movement, 2 set of 10 reps -Hip 3 way kicks, 1 set of 3 reps, 3lb ankle weights, pt cued for form and sequencing Neuromuscular Re-education: -Walking marches, 1 lap on 20 foot line, 3lb ankle weights, pt cued for use of AD and increased hip ROM -LTR on green exercise ball, 2 set of 10 reps bilaterally, pt cued to remain in pain free ROM Therapeutic Activity: -Sit to stands with 5 lb kettle bell and shoulder press, 2 sets of 3 reps, pt cued for core activation   10/29/2023  -Lumbar ROM obtained (pts standing tolerance limited) -Moist heat pack applied in supine during plinth activities 15 minutes -ROM assessed Therapeutic Exercise: -Supine bridges 2 sets of 5 reps, 3 second holds, symptomatic, pt cued for max hip extension -Hook lying clamshells, 2 sets of 8  reps, 3 second holds, RTB at knees  -SLR, partial ROM due to pain, 1 set of 8 reps, pt cued for core activation throughout Neuromuscular Re-education: -PPT 2 set of 10 reps, 3 second holds, pr cued to remain in pain free ROM -LTR 2 set of 10 reps bilaterally, pt cued to remain in pain free ROM   10/21/23 physical therapy evaluation and HEP instruction                                                                                                                                 PATIENT EDUCATION:  Education details: Patient educated on exam findings, POC, scope of PT, HEP, and what to expect next visit. Person educated: Patient Education method: Explanation, Demonstration, and Handouts Education comprehension: verbalized understanding, returned demonstration, verbal cues required, and tactile cues required  HOME EXERCISE PROGRAM: 10/22/23:  Decompression exercises 1-5  Access Code: [email or web address] URL: [email or web address] Date: 10/22/2023 Prepared by: [facility]  Exercises - Sitting to Supine Roll  - 1 x daily - 7 x weekly - 1 sets - 1 reps  11/18/23: - Supine Bridge  - 1 x daily - 7 x weekly - 3 sets - 10 reps - 5 hold - Sidelying Hip Abduction  - 1 x daily - 7 x weekly - 3 sets - 10 reps - Sit to Stand  - 2 x daily - 7 x weekly - 3 sets - 10 reps  ASSESSMENT:  CLINICAL IMPRESSION: Progress note with the following findings:  Pt has met 2/2 STGs and 2/6 LTGs.  Pt reports compliance with HEP daily and feels she has improved by 35% since beginning therapy.  Objective testing shows ability to ambulate 288ft with rollator with no LOB or reports of increased pain.  Vast improvements with STS times and has ability to stand on one leg 1-3 max BLE.  Pt continues to show weakness, decreased activity tolerance and impaired balance.  Pt stated at beginning of session she would like to learn how to get up from floor as well.  Pt will continue to benefit from skilled intervention to address LTGs not met.  Added exercises to improve gluteal strength to HEP with printout given and verbalized understanding.     Eval: Patient is a 68 y.o. female who was seen today for physical therapy evaluation and treatment for M41.9 (ICD-10-CM) - Scoliosis, unspecified.  Patient demonstrates muscle weakness, reduced ROM, and fascial restrictions which are likely contributing to symptoms of pain and are negatively impacting patient ability to perform ADLs and functional mobility tasks. Patient will benefit from skilled physical therapy services to address these deficits to reduce pain and improve level of function with ADLs and functional mobility tasks.   OBJECTIVE IMPAIRMENTS: Abnormal gait, decreased activity tolerance, decreased balance, decreased ROM, decreased strength, impaired perceived functional ability, and pain.   ACTIVITY LIMITATIONS:  carrying, lifting, bending, sitting, standing, sleeping, transfers, bed mobility, and locomotion level  PARTICIPATION LIMITATIONS: meal prep, cleaning, laundry, shopping, and community activity  REHAB POTENTIAL: Good  CLINICAL DECISION MAKING: Evolving/moderate complexity  EVALUATION COMPLEXITY: Moderate   GOALS: Goals reviewed with patient? Yes  SHORT TERM GOALS: Target date: 11/05/2023  patient will be independent with initial HEP  Baseline:  11/18/23:  Reports compliance iwht exercises every other day Goal status: MET  2.  Patient will report 30% improvement overall  Baseline:  11/18/23:  Reports improvements by 35% Goal status: MET  LONG TERM GOALS: Target date: 11/19/23  Patient will be independent in self management strategies to improve quality of life and functional outcomes.  Baseline:  Goal status: Ongoing  2.  Patient will report 50% improvement overall  Baseline: 11/18/23: Reports improvements by 35% Goal status: Ongoing  3.  Patient will improve Modified Oswestry score by 8 points (20/50 or less)  to demonstrate improved perceived function  Baseline: 28/50; 11/18/23: 22/50 Goal status: MET  4.  Patient will improve 5 times sit to stand score to 15 sec or less to demonstrate improved functional mobility and increased leg strength.    Baseline: 58.2 sec;  11/18/23: 5STS 36.23 Goal status: MET  5.   Patient will increase right leg MMT's to 4+ to 5/5 to allow navigation of steps without gait deviation or loss of balance  Baseline:  Goal status: Ongoing  6.  Patient will able to stand SLS on each leg x 5 to demonstrate improved functional balance  Baseline: SLS Rt 1, Lt 3 Goal status: Ongoing  PLAN:  PT FREQUENCY: 2x/week  PT DURATION: 4 weeks  PLANNED INTERVENTIONS: 97164- PT Re-evaluation, 97110-Therapeutic exercises, 97530- Therapeutic activity, 97112- Neuromuscular re-education, 97535- Self Care, 02859- Manual therapy, [id number]- Gait  training, [phone]- Orthotic Fit/training, [phone]- Canalith repositioning, [id number]- Aquatic Therapy, 97760- Splinting, [phone]- Wound care (first 20 sq cm), 97598- Wound care (each additional 20 sq cm)Patient/Family education, Balance training, Stair training, Taping, Dry Needling, Joint mobilization, Joint manipulation, Spinal manipulation, Spinal mobilization, Scar mobilization, and DME instructions. SABRA  PLAN FOR NEXT SESSION: decompression exercises; postural strengthening; balance; unable to tolerate long periods of standing.  Work on floor to standing and balance.  Augustin Mclean, LPTA/CLT; CBIS [phone]  8:31 AM, 11/24/23

## 2023-11-20 NOTE — Assessment & Plan Note (Signed)
 Transitioned to Prolia due to kidney concerns with Reclast . - Transitioned from Reclast  to Prolia.

## 2023-11-20 NOTE — Assessment & Plan Note (Signed)
 She is currently prescribed atorvastatin  40 mg daily and ezetimibe  10 mg daily. Recent labs showed good control of lipids.

## 2023-11-20 NOTE — Assessment & Plan Note (Signed)
 Transitioned to Prolia due to potential kidney impact from Reclast . - Continue monitoring kidney function. - Transitioned from Reclast  to Prolia.

## 2023-11-20 NOTE — Assessment & Plan Note (Signed)
 Discontinued lisinopril  due to kidney concerns. Blood pressure well-controlled.

## 2023-11-21 ENCOUNTER — Encounter (HOSPITAL_COMMUNITY): Payer: Self-pay

## 2023-11-21 ENCOUNTER — Ambulatory Visit (HOSPITAL_COMMUNITY)

## 2023-11-21 DIAGNOSIS — R262 Difficulty in walking, not elsewhere classified: Secondary | ICD-10-CM | POA: Diagnosis not present

## 2023-11-21 DIAGNOSIS — M5459 Other low back pain: Secondary | ICD-10-CM

## 2023-11-21 DIAGNOSIS — R2689 Other abnormalities of gait and mobility: Secondary | ICD-10-CM

## 2023-11-21 DIAGNOSIS — R293 Abnormal posture: Secondary | ICD-10-CM

## 2023-11-21 NOTE — Therapy (Signed)
 OUTPATIENT PHYSICAL THERAPY THORACOLUMBAR TREATMENT   Patient Name: Erica Gallegos MRN: [id number] DOB:1955/10/18, 68 y.o., female Today's Date: 11/21/2023  END OF SESSION:  PT End of Session - 11/21/23 1415     Visit Number 6    Number of Visits 8    Date for Recertification  11/19/23    Authorization Type BCBS Medicare    Authorization Time Period no auth needed    PT Start Time 1415    PT Stop Time 1455    PT Time Calculation (min) 40 min    Equipment Utilized During Treatment Gait belt    Activity Tolerance Patient tolerated treatment well    Behavior During Therapy WFL for tasks assessed/performed             Past Medical History:  Diagnosis Date   Allergy    Anxiety    Arthritis    Cataract    Removed/both eyes   Chronic kidney disease    Small cyst   Complication of anesthesia    Patient woke up during anesthesia with bunionectomy   GERD (gastroesophageal reflux disease)    HLD (hyperlipidemia)    HTN (hypertension)    Major depressive disorder    Migraine with aura    Neuromuscular disorder (HCC) 2018/approximately   Spinal stenosis   PONV (postoperative nausea and vomiting)    RAS (renal artery stenosis)    Scoliosis    Sleep apnea    Sleep related hypoxia    Spinal stenosis    Past Surgical History:  Procedure Laterality Date   BUNIONECTOMY     CATARACT EXTRACTION     CHOLECYSTECTOMY     COLONOSCOPY WITH PROPOFOL  N/A 12/13/2021   Procedure: COLONOSCOPY WITH PROPOFOL ;  Surgeon: Shaaron Lamar CHRISTELLA, MD;  [facility]: [facility];  Service: Endoscopy;  Laterality: N/A;  8:00am, asa 2   ESOPHAGOGASTRODUODENOSCOPY (EGD) WITH PROPOFOL  N/A 04/05/2021   Procedure: ESOPHAGOGASTRODUODENOSCOPY (EGD) WITH PROPOFOL ;  Surgeon: Shaaron Lamar CHRISTELLA, MD;  [facility]: [facility];  Service: Endoscopy;  Laterality: N/A;  10:15am   ESOPHAGOGASTRODUODENOSCOPY (EGD) WITH PROPOFOL  N/A 12/13/2021   Procedure: ESOPHAGOGASTRODUODENOSCOPY (EGD) WITH PROPOFOL ;  Surgeon:  Shaaron Lamar CHRISTELLA, MD;  [facility];  Service: Endoscopy;  Laterality: N/A;   EYE SURGERY     MALONEY DILATION N/A 04/05/2021   Procedure: MALONEY DILATION;  Surgeon: Shaaron Lamar CHRISTELLA, MD;  [facility];  Service: Endoscopy;  Laterality: N/A;   MALONEY DILATION N/A 12/13/2021   Procedure: AGAPITO DILATION;  Surgeon: Shaaron Lamar CHRISTELLA, MD;  Location: [facility];  Service: Endoscopy;  Laterality: N/A;   TUBAL LIGATION  1987   Patient Active Problem List   Diagnosis Date Noted   Hypothyroidism 11/12/2023   Solitary pulmonary nodule on lung CT 09/22/2023   Stage 3b chronic kidney disease (HCC) 05/15/2023   Itching 12/26/2022   PTSD (post-traumatic stress disorder) 11/12/2022   Abnormal thyroid  blood test 06/27/2022   Hypercalcemia 06/27/2022   Need for pneumococcal 20-valent conjugate vaccination 06/18/2022   Anxiety and depression 02/26/2022   OSA on CPAP 02/26/2022   Ocular pain, right eye 02/26/2022   Need for influenza vaccination 10/23/2021   Need for shingles vaccine 10/23/2021   Memory problem 08/21/2021   Osteoporosis 07/24/2021   Vitamin D  deficiency 07/24/2021   Neuropathy 03/22/2021   Hyperlipidemia 03/22/2021   Spinal stenosis 03/22/2021   Fall 03/22/2021   Right shoulder pain 03/22/2021   Left knee pain 03/22/2021   Encounter for screening fecal  occult blood testing 08/28/2020   Encounter for gynecological examination with Papanicolaou smear of cervix 08/28/2020   Dysphagia 03/02/2020   Hypertension 03/02/2020   Nausea without vomiting 03/02/2020   Gastroesophageal reflux disease 03/02/2020    PCP: Melvenia Mungo, MD  REFERRING PROVIDER: Darnella Dorn SAUNDERS, MD  REFERRING DIAG: M41.9 (ICD-10-CM) - Scoliosis, unspecified  Rationale for Evaluation and Treatment: Rehabilitation  THERAPY DIAG:  Other low back pain  Difficulty in walking, not elsewhere classified  Abnormal posture  Other abnormalities of gait and mobility  ONSET DATE:  back pain for years  SUBJECTIVE:                                                                                                                                                                                           SUBJECTIVE STATEMENT: Pt states right shin pain is a 3/10, back is doing better. Pt states HEP compliance about 2 days per week.  Eval: Arrives with rollater; I need balance too; referred for scoliosis and also has spinal stenosis; takes reclast  for osteoporosis.  My goal is to have surgery done.  Originally injured her back in the 39's.  Worst pain in the last 3 years; was originally seeing Dr. Carollee.  Now seeing Dr. Darnella who referred for therapy.  If she has surgery will be at [facility] or [facility]; has had to start using a rollator over the past year.  MD told me it (therapy) would not help  PERTINENT HISTORY:  Anxiety, HTN, migraine, osteoporosis, PTSD; spinal stenosis  PAIN:  Are you having pain? Yes: NPRS scale: 3/10; 3/10 at best; 10/10 at worst Pain location: low back and but now up to right shoulder and arm, right leg can give out Pain description: aching sore constant but can worsen with activity Aggravating factors: standing and walking, turning over in bed Relieving factors: sit, rest  PRECAUTIONS: Fall  RED FLAGS: None   WEIGHT BEARING RESTRICTIONS: No  FALLS:  Has patient fallen in last 6 months? Yes. Number of falls 1  LIVING ENVIRONMENT: Lives with: lives with their spouse Lives in: House/apartment Stairs: No ramp entry Has following equipment at home: Vannie - 4 wheeled  OCCUPATION: retired  PLOF: Independent with household mobility with device and Independent with community mobility with device  PATIENT GOALS: to have surgery done  NEXT MD VISIT: PRN  OBJECTIVE:  Note: Objective measures were completed at Evaluation unless otherwise noted.  DIAGNOSTIC FINDINGS:  DEGENERATIVE CHANGES: Moderate disc height loss at C5-6 and C6-7.  Small central disc-osteophyte complex at C6-7. Unchanged severe right neural foraminal narrowing at C6-7. Unchanged moderate right and severe left  neural foraminal narrowing at C5-6 due to facet arthropathy and uncovertebral joint spurring.I  IMPRESSION: 1. Severe spinal canal stenosis at L1-2, L2-3, and L4-5 levels. 2. Severe right neural foraminal narrowing at L2-3 and severe bilateral neural foraminal narrowing at L4-5. 3. Levoscoliotic curvature of the lumbar spine with 7 mm left lateral listhesis of L3 on L4 and 10 mm right lateral listhesis of L1 on L2. 4. No significant spinal canal stenosis in the cervical or thoracic spine. 5. Moderate right and severe left neural foraminal narrowing at C5-6. 6. Severe right neural foraminal narrowing at C6-7. 7. 10 mm solid lung nodule in the left lower lobe. Per Fleischner society recommendations, consider CT, PET/CT, or tissue sampling at 3 months.  PATIENT SURVEYS:  Modified Oswestry:  MODIFIED OSWESTRY DISABILITY SCALE   Date: 10/22/23 Score Date: 11/18/23  Pain intensity  3 =  Pain medication provides me with moderate relief from pain.  2. Personal care (washing, dressing, etc.)  1 =  I can take care of myself normally, but it increases my pain.  3. Lifting  3 = Pain prevents me from lifting heavy weights, but I can manage light to medium weights if they are conveniently positioned  4. Walking  3 =  Pain prevents me from walking more than  mile.  5. Sitting  2 =  Pain prevents me from sitting more than 1 hour.  6. Standing  3 =  Pain prevents me from standing more than 1/2 hour.  7. Sleeping  1 = I can sleep well only by using pain medication.  8. Social Life  2 = Pain prevents me from participating in more energetic activities (eg. sports, dancing).  9. Traveling  1 =  I can travel anywhere, but it increases my pain.  10. Employment/ Homemaking  3 = Pain prevents me from doing anything but light duties.  Total 28/50; 56% 22/50= 44%    Interpretation of scores: Score Category Description  0-20% Minimal Disability The patient can cope with most living activities. Usually no treatment is indicated apart from advice on lifting, sitting and exercise  21-40% Moderate Disability The patient experiences more pain and difficulty with sitting, lifting and standing. Travel and social life are more difficult and they may be disabled from work. Personal care, sexual activity and sleeping are not grossly affected, and the patient can usually be managed by conservative means  41-60% Severe Disability Pain remains the main problem in this group, but activities of daily living are affected. These patients require a detailed investigation  61-80% Crippled Back pain impinges on all aspects of the patient's life. Positive intervention is required  81-100% Bed-bound These patients are either bed-bound or exaggerating their symptoms  Bluford FORBES Zoe DELENA Karon DELENA, et al. Surgery versus conservative management of stable thoracolumbar fracture: the PRESTO feasibility RCT. [place] ([place]): [employer]; 2021 Nov. [facility] Technology Assessment, No. 25.62.) Appendix 3, Oswestry Disability Index category descriptors. Available from: Findjewelers.cz  Minimally Clinically Important Difference (MCID) = 12.8%  COGNITION: Overall cognitive status: Within functional limits for tasks assessed     SENSATION: Both fingers go numb sometimes  MUSCLE LENGTH: Hamstrings: test next visit POSTURE: forward head, increased thoracic kyphosis, and flexed trunk   PALPATION: Right shoulder elevated; right rib hump; tenderness mid back and low back noted  LUMBAR ROM:  AROM eval  Flexion 65, tightness in hamstinrgs, BUE support on walker  Extension 15  Right lateral flexion 6  Left lateral flexion 11  Right  rotation Unable, legs giving out  Left rotation Unable, legs giving out   (Blank rows = not tested)  LOWER  EXTREMITY ROM:     Active  Right eval Left eval  Hip flexion    Hip extension    Hip abduction    Hip adduction    Hip internal rotation    Hip external rotation    Knee flexion    Knee extension    Ankle dorsiflexion    Ankle plantarflexion    Ankle inversion    Ankle eversion     (Blank rows = not tested)  LOWER EXTREMITY MMT:    MMT Right eval Left eval Right 11/18/23` Left 11/18/23  Hip flexion 3+ 4 4- 4+  Hip extension   3/5 3+/5  Hip abduction      Hip adduction      Hip internal rotation      Hip external rotation      Knee flexion      Knee extension 3+ 4+ 4/5 4+  Ankle dorsiflexion 4- 4+ 4/5 4+  Ankle plantarflexion      Ankle inversion      Ankle eversion       (Blank rows = not tested)   FUNCTIONAL TESTS:  5 times sit to stand: 58.21 sec SLS unable  11/18/23:  216 with rollator, no increased pain 5STS: 36.23 SLS Rt 1, Lt 3  GAIT: Distance walked: 50 ft in clinic Assistive device utilized: Walker - 4 wheeled Level of assistance: Modified independence Comments: flexed trunk; slow gait speed  TREATMENT DATE:  11/21/2023  Therapeutic Exercise: -Nustep, 5 minutes, level 3 resistance, pt cued for 60-70 spm -Supine bridges 2 sets of 4 reps, 3 second holds, symptomatic, pt cued for max hip extension, BTB at knees Neuromuscular Re-education: -Standing balance on blue mat, narrow BOS, EC; tandem stance, EO; SLS, EO ; 30 second bouts, CGA required for safety -Bird dogs, 1 set of 5 reps, pt holds for 3 seconds, bilaterally, pt cued for UE/LE positioning Therapeutic Activity: -Floor to stand transfer, x5, verbal and demonstration key, pt education on falls training and half kneeling position emphasized. Pt requires occasional CGA for safety.  11/18/23: 261ft with rollator, no increased pain 5STS 36.23 MMT see above SLS Rt 1, Lt 3 Modified Oswestry: see above  11/13/23: - Nustep, 5 minutes, [place] trail level 1 resistance, pt  cued for 50-60 spm - STS 5x no HHA, cueing for mechanics - Toe tapping 6in step height - Sidestep 2RT inside // bars with HHA RTB Supine: - LTR on red exercise ball 10x - DKTC on green exercise ball, pt cued for smooth and controlled movement, 2 set of 10 reps   PATIENT EDUCATION:  Education details: Patient educated on exam findings, POC, scope of PT, HEP, and what to expect next visit. Person educated: Patient Education method: Explanation, Demonstration, and Handouts Education comprehension: verbalized understanding, returned demonstration, verbal cues required, and tactile cues required  HOME EXERCISE PROGRAM: 10/22/23: Decompression exercises 1-5  Access Code: [email or web address] URL: [email or web address] Date: 10/22/2023 Prepared by: [facility]  Exercises - Sitting to Supine Roll  - 1 x daily - 7 x weekly - 1 sets - 1 reps  11/18/23: - Supine Bridge  - 1 x daily - 7 x weekly - 3 sets - 10 reps - 5 hold - Sidelying Hip Abduction  - 1 x daily - 7 x weekly - 3 sets - 10 reps - Sit to Stand  -  2 x daily - 7 x weekly - 3 sets - 10 reps  ASSESSMENT:  CLINICAL IMPRESSION: Patient continues to demonstrate decreased low back pain, increased RLE pain, increased LE/core strength, decreased gait quality and improved balance. Patient also demonstrates increased activity tolerance during today's session with 4 floor to stand transfers with education given on rising techniques, occasional CGA for safety. Patient able to initiate advanced functional transfers, progressed balance and core activation exercises today with floor to stand transfers and SLS variations, good performance with verbal cueing. Patient would continue to benefit from skilled physical therapy for increased endurance with ambulation, increased LE strength, and improved balance for improved quality of life, improved independence with gait training and continued progress towards therapy goals. .     Eval: Patient is  a 68 y.o. female who was seen today for physical therapy evaluation and treatment for M41.9 (ICD-10-CM) - Scoliosis, unspecified.  Patient demonstrates muscle weakness, reduced ROM, and fascial restrictions which are likely contributing to symptoms of pain and are negatively impacting patient ability to perform ADLs and functional mobility tasks. Patient will benefit from skilled physical therapy services to address these deficits to reduce pain and improve level of function with ADLs and functional mobility tasks.   OBJECTIVE IMPAIRMENTS: Abnormal gait, decreased activity tolerance, decreased balance, decreased ROM, decreased strength, impaired perceived functional ability, and pain.   ACTIVITY LIMITATIONS: carrying, lifting, bending, sitting, standing, sleeping, transfers, bed mobility, and locomotion level  PARTICIPATION LIMITATIONS: meal prep, cleaning, laundry, shopping, and community activity  REHAB POTENTIAL: Good  CLINICAL DECISION MAKING: Evolving/moderate complexity  EVALUATION COMPLEXITY: Moderate   GOALS: Goals reviewed with patient? Yes  SHORT TERM GOALS: Target date: 11/05/2023  patient will be independent with initial HEP  Baseline:  11/18/23:  Reports compliance iwht exercises every other day Goal status: MET  2.  Patient will report 30% improvement overall  Baseline:  11/18/23:  Reports improvements by 35% Goal status: MET  LONG TERM GOALS: Target date: 11/19/23  Patient will be independent in self management strategies to improve quality of life and functional outcomes.  Baseline:  Goal status: Ongoing  2.  Patient will report 50% improvement overall  Baseline: 11/18/23: Reports improvements by 35% Goal status: Ongoing  3.  Patient will improve Modified Oswestry score by 8 points (20/50 or less)  to demonstrate improved perceived function  Baseline: 28/50; 11/18/23: 22/50 Goal status: MET  4.  Patient will improve 5 times sit to stand score to 15 sec  or less to demonstrate improved functional mobility and increased leg strength.    Baseline: 58.2 sec;  11/18/23: 5STS 36.23 Goal status: MET  5.   Patient will increase right leg MMT's to 4+ to 5/5 to allow navigation of steps without gait deviation or loss of balance  Baseline:  Goal status: Ongoing  6.  Patient will able to stand SLS on each leg x 5 to demonstrate improved functional balance  Baseline: SLS Rt 1, Lt 3 Goal status: Ongoing  PLAN:  PT FREQUENCY: 2x/week  PT DURATION: 4 weeks  PLANNED INTERVENTIONS: [id number]- PT Re-evaluation, 97110-Therapeutic exercises, 97530- Therapeutic activity, 97112- Neuromuscular re-education, 97535- Self Care, [id number]- Manual therapy, [id number]- Gait training, [phone]- Orthotic Fit/training, [phone]- Canalith repositioning, [id number]- Aquatic Therapy, 97760- Splinting, [phone]- Wound care (first 20 sq cm), 97598- Wound care (each additional 20 sq cm)Patient/Family education, Balance training, Stair training, Taping, Dry Needling, Joint mobilization, Joint manipulation, Spinal manipulation, Spinal mobilization, Scar mobilization, and DME instructions. SABRA  PLAN FOR NEXT SESSION: decompression  exercises; postural strengthening; balance; unable to tolerate long periods of standing.  Work on floor to standing and balance.  Lang Ada, PT, DPT [facility] Office: [phone] 3:02 PM, 11/21/23

## 2023-11-22 ENCOUNTER — Other Ambulatory Visit: Payer: Self-pay

## 2023-11-24 NOTE — Addendum Note (Signed)
 Addended byBETHA LEODIS NO S on: 11/24/2023 07:39 AM   Modules accepted: Orders

## 2023-11-25 ENCOUNTER — Ambulatory Visit (HOSPITAL_COMMUNITY)

## 2023-11-26 DIAGNOSIS — M48062 Spinal stenosis, lumbar region with neurogenic claudication: Secondary | ICD-10-CM | POA: Diagnosis not present

## 2023-11-26 DIAGNOSIS — M5416 Radiculopathy, lumbar region: Secondary | ICD-10-CM | POA: Diagnosis not present

## 2023-11-26 DIAGNOSIS — F1190 Opioid use, unspecified, uncomplicated: Secondary | ICD-10-CM | POA: Diagnosis not present

## 2023-11-27 ENCOUNTER — Ambulatory Visit (HOSPITAL_COMMUNITY)

## 2023-11-27 ENCOUNTER — Encounter (HOSPITAL_COMMUNITY): Payer: Self-pay

## 2023-11-27 DIAGNOSIS — R262 Difficulty in walking, not elsewhere classified: Secondary | ICD-10-CM

## 2023-11-27 DIAGNOSIS — R293 Abnormal posture: Secondary | ICD-10-CM | POA: Diagnosis not present

## 2023-11-27 DIAGNOSIS — M5459 Other low back pain: Secondary | ICD-10-CM

## 2023-11-27 DIAGNOSIS — R2689 Other abnormalities of gait and mobility: Secondary | ICD-10-CM | POA: Diagnosis not present

## 2023-11-27 NOTE — Therapy (Signed)
 OUTPATIENT PHYSICAL THERAPY THORACOLUMBAR TREATMENT   Patient Name: Erica Gallegos MRN: [id number] DOB:Oct 31, 1955, 68 y.o., female Today's Date: 11/27/2023  END OF SESSION:  PT End of Session - 11/27/23 1121     Visit Number 7    Number of Visits 13    Date for Recertification  12/19/23    Authorization Type BCBS Medicare    Authorization Time Period no auth needed    PT Start Time 1122    PT Stop Time 1205    PT Time Calculation (min) 43 min    Activity Tolerance Patient tolerated treatment well;Patient limited by pain;No increased pain    Behavior During Therapy WFL for tasks assessed/performed             Past Medical History:  Diagnosis Date   Allergy    Anxiety    Arthritis    Cataract    Removed/both eyes   Chronic kidney disease    Small cyst   Complication of anesthesia    Patient woke up during anesthesia with bunionectomy   GERD (gastroesophageal reflux disease)    HLD (hyperlipidemia)    HTN (hypertension)    Major depressive disorder    Migraine with aura    Neuromuscular disorder (HCC) 2018/approximately   Spinal stenosis   PONV (postoperative nausea and vomiting)    RAS (renal artery stenosis)    Scoliosis    Sleep apnea    Sleep related hypoxia    Spinal stenosis    Past Surgical History:  Procedure Laterality Date   BUNIONECTOMY     CATARACT EXTRACTION     CHOLECYSTECTOMY     COLONOSCOPY WITH PROPOFOL  N/A 12/13/2021   Procedure: COLONOSCOPY WITH PROPOFOL ;  Surgeon: Shaaron Lamar CHRISTELLA, MD;  [facility];  Service: Endoscopy;  Laterality: N/A;  8:00am, asa 2   ESOPHAGOGASTRODUODENOSCOPY (EGD) WITH PROPOFOL  N/A 04/05/2021   Procedure: ESOPHAGOGASTRODUODENOSCOPY (EGD) WITH PROPOFOL ;  Surgeon: Shaaron Lamar CHRISTELLA, MD;  [facility]: [facility];  Service: Endoscopy;  Laterality: N/A;  10:15am   ESOPHAGOGASTRODUODENOSCOPY (EGD) WITH PROPOFOL  N/A 12/13/2021   Procedure: ESOPHAGOGASTRODUODENOSCOPY (EGD) WITH PROPOFOL ;  Surgeon: Shaaron Lamar CHRISTELLA, MD;  [facility];  Service: Endoscopy;  Laterality: N/A;   EYE SURGERY     MALONEY DILATION N/A 04/05/2021   Procedure: MALONEY DILATION;  Surgeon: Shaaron Lamar CHRISTELLA, MD;  [facility];  Service: Endoscopy;  Laterality: N/A;   MALONEY DILATION N/A 12/13/2021   Procedure: AGAPITO DILATION;  Surgeon: Shaaron Lamar CHRISTELLA, MD;  Location: [facility];  Service: Endoscopy;  Laterality: N/A;   TUBAL LIGATION  1987   Patient Active Problem List   Diagnosis Date Noted   Hypothyroidism 11/12/2023   Solitary pulmonary nodule on lung CT 09/22/2023   Stage 3b chronic kidney disease (HCC) 05/15/2023   Itching 12/26/2022   PTSD (post-traumatic stress disorder) 11/12/2022   Abnormal thyroid  blood test 06/27/2022   Hypercalcemia 06/27/2022   Need for pneumococcal 20-valent conjugate vaccination 06/18/2022   Anxiety and depression 02/26/2022   OSA on CPAP 02/26/2022   Ocular pain, right eye 02/26/2022   Need for influenza vaccination 10/23/2021   Need for shingles vaccine 10/23/2021   Memory problem 08/21/2021   Osteoporosis 07/24/2021   Vitamin D  deficiency 07/24/2021   Neuropathy 03/22/2021   Hyperlipidemia 03/22/2021   Spinal stenosis 03/22/2021   Fall 03/22/2021   Right shoulder pain 03/22/2021   Left knee pain 03/22/2021   Encounter for screening fecal occult blood testing 08/28/2020  Encounter for gynecological examination with Papanicolaou smear of cervix 08/28/2020   Dysphagia 03/02/2020   Hypertension 03/02/2020   Nausea without vomiting 03/02/2020   Gastroesophageal reflux disease 03/02/2020    PCP: Melvenia Mungo, MD  REFERRING PROVIDER: Darnella Dorn SAUNDERS, MD  REFERRING DIAG: M41.9 (ICD-10-CM) - Scoliosis, unspecified  Rationale for Evaluation and Treatment: Rehabilitation  THERAPY DIAG:  Other low back pain  Difficulty in walking, not elsewhere classified  Abnormal posture  Other abnormalities of gait and mobility  ONSET DATE: back pain  for years  SUBJECTIVE:                                                                                                                                                                                           SUBJECTIVE STATEMENT: Pt stated LBP with posterior radicular symptoms down Rt to heel and Lt LE ending halfway on thigh.  Eval: Arrives with rollater; I need balance too; referred for scoliosis and also has spinal stenosis; takes reclast  for osteoporosis.  My goal is to have surgery done.  Originally injured her back in the 60's.  Worst pain in the last 3 years; was originally seeing Dr. Carollee.  Now seeing Dr. Darnella who referred for therapy.  If she has surgery [facility]; has had to start using a rollator over the past year.  MD told me it (therapy) would not help  PERTINENT HISTORY:  Anxiety, HTN, migraine, osteoporosis, PTSD; spinal stenosis  PAIN:  Are you having pain? Yes: NPRS scale: 3/10; 3/10 at best; 10/10 at worst Pain location: low back and but now up to right shoulder and arm, right leg can give out Pain description: aching sore constant but can worsen with activity Aggravating factors: standing and walking, turning over in bed Relieving factors: sit, rest  PRECAUTIONS: Fall  RED FLAGS: None   WEIGHT BEARING RESTRICTIONS: No  FALLS:  Has patient fallen in last 6 months? Yes. Number of falls 1  LIVING ENVIRONMENT: Lives with: lives with their spouse Lives in: House/apartment Stairs: No ramp entry Has following equipment at home: Vannie - 4 wheeled  OCCUPATION: retired  PLOF: Independent with household mobility with device and Independent with community mobility with device  PATIENT GOALS: to have surgery done  NEXT MD VISIT: PRN  OBJECTIVE:  Note: Objective measures were completed at Evaluation unless otherwise noted.  DIAGNOSTIC FINDINGS:  DEGENERATIVE CHANGES: Moderate disc height loss at C5-6 and C6-7. Small central  disc-osteophyte complex at C6-7. Unchanged severe right neural foraminal narrowing at C6-7. Unchanged moderate right and severe left neural foraminal narrowing at C5-6 due to facet arthropathy  and uncovertebral joint spurring.I  IMPRESSION: 1. Severe spinal canal stenosis at L1-2, L2-3, and L4-5 levels. 2. Severe right neural foraminal narrowing at L2-3 and severe bilateral neural foraminal narrowing at L4-5. 3. Levoscoliotic curvature of the lumbar spine with 7 mm left lateral listhesis of L3 on L4 and 10 mm right lateral listhesis of L1 on L2. 4. No significant spinal canal stenosis in the cervical or thoracic spine. 5. Moderate right and severe left neural foraminal narrowing at C5-6. 6. Severe right neural foraminal narrowing at C6-7. 7. 10 mm solid lung nodule in the left lower lobe. Per Fleischner society recommendations, consider CT, PET/CT, or tissue sampling at 3 months.  PATIENT SURVEYS:  Modified Oswestry:  MODIFIED OSWESTRY DISABILITY SCALE   Date: 10/22/23 Score Date: 11/18/23  Pain intensity  3 =  Pain medication provides me with moderate relief from pain.  2. Personal care (washing, dressing, etc.)  1 =  I can take care of myself normally, but it increases my pain.  3. Lifting  3 = Pain prevents me from lifting heavy weights, but I can manage light to medium weights if they are conveniently positioned  4. Walking  3 =  Pain prevents me from walking more than  mile.  5. Sitting  2 =  Pain prevents me from sitting more than 1 hour.  6. Standing  3 =  Pain prevents me from standing more than 1/2 hour.  7. Sleeping  1 = I can sleep well only by using pain medication.  8. Social Life  2 = Pain prevents me from participating in more energetic activities (eg. sports, dancing).  9. Traveling  1 =  I can travel anywhere, but it increases my pain.  10. Employment/ Homemaking  3 = Pain prevents me from doing anything but light duties.  Total 28/50; 56% 22/50= 44%    Interpretation of scores: Score Category Description  0-20% Minimal Disability The patient can cope with most living activities. Usually no treatment is indicated apart from advice on lifting, sitting and exercise  21-40% Moderate Disability The patient experiences more pain and difficulty with sitting, lifting and standing. Travel and social life are more difficult and they may be disabled from work. Personal care, sexual activity and sleeping are not grossly affected, and the patient can usually be managed by conservative means  41-60% Severe Disability Pain remains the main problem in this group, but activities of daily living are affected. These patients require a detailed investigation  61-80% Crippled Back pain impinges on all aspects of the patient's life. Positive intervention is required  81-100% Bed-bound These patients are either bed-bound or exaggerating their symptoms  Bluford FORBES Zoe DELENA Karon DELENA, et al. Surgery versus conservative management of stable thoracolumbar fracture: the PRESTO feasibility RCT. [place] ([place]): [employer]; 2021 Nov. [facility] Technology Assessment, No. 25.62.) Appendix 3, Oswestry Disability Index category descriptors. Available from: Findjewelers.cz  Minimally Clinically Important Difference (MCID) = 12.8%  COGNITION: Overall cognitive status: Within functional limits for tasks assessed     SENSATION: Both fingers go numb sometimes  MUSCLE LENGTH: Hamstrings: test next visit POSTURE: forward head, increased thoracic kyphosis, and flexed trunk   PALPATION: Right shoulder elevated; right rib hump; tenderness mid back and low back noted  LUMBAR ROM:  AROM eval  Flexion 65, tightness in hamstinrgs, BUE support on walker  Extension 15  Right lateral flexion 6  Left lateral flexion 11  Right rotation Unable, legs giving out  Left rotation Unable,  legs giving out   (Blank rows = not tested)  LOWER  EXTREMITY ROM:     Active  Right eval Left eval  Hip flexion    Hip extension    Hip abduction    Hip adduction    Hip internal rotation    Hip external rotation    Knee flexion    Knee extension    Ankle dorsiflexion    Ankle plantarflexion    Ankle inversion    Ankle eversion     (Blank rows = not tested)  LOWER EXTREMITY MMT:    MMT Right eval Left eval Right 11/18/23` Left 11/18/23  Hip flexion 3+ 4 4- 4+  Hip extension   3/5 3+/5  Hip abduction      Hip adduction      Hip internal rotation      Hip external rotation      Knee flexion      Knee extension 3+ 4+ 4/5 4+  Ankle dorsiflexion 4- 4+ 4/5 4+  Ankle plantarflexion      Ankle inversion      Ankle eversion       (Blank rows = not tested)   FUNCTIONAL TESTS:  5 times sit to stand: 58.21 sec SLS unable  11/18/23:  216 with rollator, no increased pain 5STS: 36.23 SLS Rt 1, Lt 3  GAIT: Distance walked: 50 ft in clinic Assistive device utilized: Walker - 4 wheeled Level of assistance: Modified independence Comments: flexed trunk; slow gait speed  TREATMENT DATE:  11/27/23:   -Floor to stand transfer, x4, verbal and demonstration key, pt education on falls training and half kneeling position emphasized. Pt requires occasional CGA for safety. Quadruped: Janeal Dog 10x 5 holds Seated - Piriformis stretch 3x 30 Standing: - squats front of chair 10x - Tandem stance 3x 30  - Lunges onto 6in step 5x 3 holds intermittent HHA   11/21/2023  Therapeutic Exercise: -Nustep, 5 minutes, level 3 resistance, pt cued for 60-70 spm -Supine bridges 2 sets of 4 reps, 3 second holds, symptomatic, pt cued for max hip extension, BTB at knees Neuromuscular Re-education: -Standing balance on blue mat, narrow BOS, EC; tandem stance, EO; SLS, EO ; 30 second bouts, CGA required for safety -Bird dogs, 1 set of 5 reps, pt holds for 3 seconds, bilaterally, pt cued for UE/LE positioning Therapeutic  Activity: -Floor to stand transfer, x5, verbal and demonstration key, pt education on falls training and half kneeling position emphasized. Pt requires occasional CGA for safety.  11/18/23: 272ft with rollator, no increased pain 5STS 36.23 MMT see above SLS Rt 1, Lt 3 Modified Oswestry: see above  11/13/23: - Nustep, 5 minutes, [place] trail level 1 resistance, pt cued for 50-60 spm - STS 5x no HHA, cueing for mechanics - Toe tapping 6in step height - Sidestep 2RT inside // bars with HHA RTB Supine: - LTR on red exercise ball 10x - DKTC on green exercise ball, pt cued for smooth and controlled movement, 2 set of 10 reps   PATIENT EDUCATION:  Education details: Patient educated on exam findings, POC, scope of PT, HEP, and what to expect next visit. Person educated: Patient Education method: Explanation, Demonstration, and Handouts Education comprehension: verbalized understanding, returned demonstration, verbal cues required, and tactile cues required  HOME EXERCISE PROGRAM: 10/22/23: Decompression exercises 1-5  Access Code: [email or web address] URL: [email or web address] Date: 10/22/2023 Prepared by: [facility]  Exercises - Sitting to Supine Roll  - 1 x daily -  7 x weekly - 1 sets - 1 reps  11/18/23: - Supine Bridge  - 1 x daily - 7 x weekly - 3 sets - 10 reps - 5 hold - Sidelying Hip Abduction  - 1 x daily - 7 x weekly - 3 sets - 10 reps - Sit to Stand  - 2 x daily - 7 x weekly - 3 sets - 10 reps  11/27/23: Piriformis stretch 3x 30  ASSESSMENT:  CLINICAL IMPRESSION: Session focus on core stability, hip strengthening and functional strengthening.  Continued training for floor to standing with min cueing for form to increase ease, did require UE support with use of mat and occasional CGA for safety.  Pt encouraged to have chair near to assist with standing from the floor at home.  Added lunges for functional strengthening that was difficult due to LE  weakness and fatigue as this was the last exercise complete today.  Added piriformis stretch to address radicular symptoms with positive results, pt reports decreased radicular symptoms, given copy of stretch to add to HEP.  Eval: Patient is a 68 y.o. female who was seen today for physical therapy evaluation and treatment for M41.9 (ICD-10-CM) - Scoliosis, unspecified.  Patient demonstrates muscle weakness, reduced ROM, and fascial restrictions which are likely contributing to symptoms of pain and are negatively impacting patient ability to perform ADLs and functional mobility tasks. Patient will benefit from skilled physical therapy services to address these deficits to reduce pain and improve level of function with ADLs and functional mobility tasks.   OBJECTIVE IMPAIRMENTS: Abnormal gait, decreased activity tolerance, decreased balance, decreased ROM, decreased strength, impaired perceived functional ability, and pain.   ACTIVITY LIMITATIONS: carrying, lifting, bending, sitting, standing, sleeping, transfers, bed mobility, and locomotion level  PARTICIPATION LIMITATIONS: meal prep, cleaning, laundry, shopping, and community activity  REHAB POTENTIAL: Good  CLINICAL DECISION MAKING: Evolving/moderate complexity  EVALUATION COMPLEXITY: Moderate   GOALS: Goals reviewed with patient? Yes  SHORT TERM GOALS: Target date: 11/05/2023  patient will be independent with initial HEP  Baseline:  11/18/23:  Reports compliance iwht exercises every other day Goal status: MET  2.  Patient will report 30% improvement overall  Baseline:  11/18/23:  Reports improvements by 35% Goal status: MET  LONG TERM GOALS: Target date: 11/19/23  Patient will be independent in self management strategies to improve quality of life and functional outcomes.  Baseline:  Goal status: Ongoing  2.  Patient will report 50% improvement overall  Baseline: 11/18/23: Reports improvements by 35% Goal status:  Ongoing  3.  Patient will improve Modified Oswestry score by 8 points (20/50 or less)  to demonstrate improved perceived function  Baseline: 28/50; 11/18/23: 22/50 Goal status: MET  4.  Patient will improve 5 times sit to stand score to 15 sec or less to demonstrate improved functional mobility and increased leg strength.    Baseline: 58.2 sec;  11/18/23: 5STS 36.23 Goal status: MET  5.   Patient will increase right leg MMT's to 4+ to 5/5 to allow navigation of steps without gait deviation or loss of balance  Baseline:  Goal status: Ongoing  6.  Patient will able to stand SLS on each leg x 5 to demonstrate improved functional balance  Baseline: SLS Rt 1, Lt 3 Goal status: Ongoing  PLAN:  PT FREQUENCY: 2x/week  PT DURATION: 4 weeks  PLANNED INTERVENTIONS: 97164- PT Re-evaluation, 97110-Therapeutic exercises, 97530- Therapeutic activity, [id number]- Neuromuscular re-education, 97535- Self Care, 02859- Manual therapy, [id number]- Gait training, [phone]- Orthotic  Fit/training, 04007- Canalith repositioning, 02886- Aquatic Therapy, [id number]- Splinting, 02402- Wound care (first 20 sq cm), 97598- Wound care (each additional 20 sq cm)Patient/Family education, Balance training, Stair training, Taping, Dry Needling, Joint mobilization, Joint manipulation, Spinal manipulation, Spinal mobilization, Scar mobilization, and DME instructions. SABRA  PLAN FOR NEXT SESSION: decompression exercises; postural strengthening; balance; unable to tolerate long periods of standing.  Work on floor to standing and balance.  Progress functional strengthening as able.  Augustin Mclean, LPTA/CLT; CBIS [phone]  1:53 PM, 11/27/23

## 2023-12-01 ENCOUNTER — Ambulatory Visit (HOSPITAL_COMMUNITY)

## 2023-12-01 ENCOUNTER — Encounter (HOSPITAL_COMMUNITY): Payer: Self-pay

## 2023-12-01 DIAGNOSIS — R262 Difficulty in walking, not elsewhere classified: Secondary | ICD-10-CM

## 2023-12-01 DIAGNOSIS — R293 Abnormal posture: Secondary | ICD-10-CM | POA: Diagnosis not present

## 2023-12-01 DIAGNOSIS — M5459 Other low back pain: Secondary | ICD-10-CM | POA: Diagnosis not present

## 2023-12-01 DIAGNOSIS — R2689 Other abnormalities of gait and mobility: Secondary | ICD-10-CM | POA: Diagnosis not present

## 2023-12-01 LAB — CALCIUM, URINE, 24 HOUR

## 2023-12-01 NOTE — Therapy (Signed)
 OUTPATIENT PHYSICAL THERAPY THORACOLUMBAR TREATMENT   Patient Name: Erica Gallegos MRN: [id number] DOB:21-Jul-1955, 68 y.o., female Today's Date: 12/01/2023  END OF SESSION:  PT End of Session - 12/01/23 1454     Visit Number 8    Number of Visits 13    Date for Recertification  12/19/23    Authorization Type BCBS Medicare    Authorization Time Period no auth needed    PT Start Time 1455    PT Stop Time 1545    PT Time Calculation (min) 50 min    Activity Tolerance Patient tolerated treatment well;No increased pain    Behavior During Therapy WFL for tasks assessed/performed             Past Medical History:  Diagnosis Date   Allergy    Anxiety    Arthritis    Cataract    Removed/both eyes   Chronic kidney disease    Small cyst   Complication of anesthesia    Patient woke up during anesthesia with bunionectomy   GERD (gastroesophageal reflux disease)    HLD (hyperlipidemia)    HTN (hypertension)    Major depressive disorder    Migraine with aura    Neuromuscular disorder (HCC) 2018/approximately   Spinal stenosis   PONV (postoperative nausea and vomiting)    RAS (renal artery stenosis)    Scoliosis    Sleep apnea    Sleep related hypoxia    Spinal stenosis    Past Surgical History:  Procedure Laterality Date   BUNIONECTOMY     CATARACT EXTRACTION     CHOLECYSTECTOMY     COLONOSCOPY WITH PROPOFOL  N/A 12/13/2021   Procedure: COLONOSCOPY WITH PROPOFOL ;  Surgeon: Shaaron Lamar CHRISTELLA, MD;  [facility];  Service: Endoscopy;  Laterality: N/A;  8:00am, asa 2   ESOPHAGOGASTRODUODENOSCOPY (EGD) WITH PROPOFOL  N/A 04/05/2021   Procedure: ESOPHAGOGASTRODUODENOSCOPY (EGD) WITH PROPOFOL ;  Surgeon: Shaaron Lamar CHRISTELLA, MD;  [facility];  Service: Endoscopy;  Laterality: N/A;  10:15am   ESOPHAGOGASTRODUODENOSCOPY (EGD) WITH PROPOFOL  N/A 12/13/2021   Procedure: ESOPHAGOGASTRODUODENOSCOPY (EGD) WITH PROPOFOL ;  Surgeon: Shaaron Lamar CHRISTELLA, MD;  [facility];  Service: Endoscopy;  Laterality: N/A;   EYE SURGERY     MALONEY DILATION N/A 04/05/2021   Procedure: MALONEY DILATION;  Surgeon: Shaaron Lamar CHRISTELLA, MD;  [facility];  Service: Endoscopy;  Laterality: N/A;   MALONEY DILATION N/A 12/13/2021   Procedure: AGAPITO DILATION;  Surgeon: Shaaron Lamar CHRISTELLA, MD;  [facility];  Service: Endoscopy;  Laterality: N/A;   TUBAL LIGATION  1987   Patient Active Problem List   Diagnosis Date Noted   Hypothyroidism 11/12/2023   Solitary pulmonary nodule on lung CT 09/22/2023   Stage 3b chronic kidney disease (HCC) 05/15/2023   Itching 12/26/2022   PTSD (post-traumatic stress disorder) 11/12/2022   Abnormal thyroid  blood test 06/27/2022   Hypercalcemia 06/27/2022   Need for pneumococcal 20-valent conjugate vaccination 06/18/2022   Anxiety and depression 02/26/2022   OSA on CPAP 02/26/2022   Ocular pain, right eye 02/26/2022   Need for influenza vaccination 10/23/2021   Need for shingles vaccine 10/23/2021   Memory problem 08/21/2021   Osteoporosis 07/24/2021   Vitamin D  deficiency 07/24/2021   Neuropathy 03/22/2021   Hyperlipidemia 03/22/2021   Spinal stenosis 03/22/2021   Fall 03/22/2021   Right shoulder pain 03/22/2021   Left knee pain 03/22/2021   Encounter for screening fecal occult blood testing 08/28/2020   Encounter  for gynecological examination with Papanicolaou smear of cervix 08/28/2020   Dysphagia 03/02/2020   Hypertension 03/02/2020   Nausea without vomiting 03/02/2020   Gastroesophageal reflux disease 03/02/2020    PCP: Melvenia Mungo, MD  REFERRING PROVIDER: Darnella Dorn SAUNDERS, MD  REFERRING DIAG: M41.9 (ICD-10-CM) - Scoliosis, unspecified  Rationale for Evaluation and Treatment: Rehabilitation  THERAPY DIAG:  Other low back pain  Difficulty in walking, not elsewhere classified  Abnormal posture  ONSET DATE: back pain for years  SUBJECTIVE:                                                                                                                                                                                            SUBJECTIVE STATEMENT: Pt reports 2/10 pain in low back and very mild radicular symptoms down RLE. Ambulates into session with Rollator.    Eval: Arrives with rollater; I need balance too; referred for scoliosis and also has spinal stenosis; takes reclast  for osteoporosis.  My goal is to have surgery done.  Originally injured her back in the 82's.  Worst pain in the last 3 years; was originally seeing Dr. Carollee.  Now seeing Dr. Darnella who referred for therapy.  If she has surgery will be at [facility] or [facility]; has had to start using a rollator over the past year.  MD told me it (therapy) would not help  PERTINENT HISTORY:  Anxiety, HTN, migraine, osteoporosis, PTSD; spinal stenosis  PAIN:  Are you having pain? Yes: NPRS scale: 3/10; 3/10 at best; 10/10 at worst Pain location: low back and but now up to right shoulder and arm, right leg can give out Pain description: aching sore constant but can worsen with activity Aggravating factors: standing and walking, turning over in bed Relieving factors: sit, rest  PRECAUTIONS: Fall  RED FLAGS: None   WEIGHT BEARING RESTRICTIONS: No  FALLS:  Has patient fallen in last 6 months? Yes. Number of falls 1  LIVING ENVIRONMENT: Lives with: lives with their spouse Lives in: House/apartment Stairs: No ramp entry Has following equipment at home: Vannie - 4 wheeled  OCCUPATION: retired  PLOF: Independent with household mobility with device and Independent with community mobility with device  PATIENT GOALS: to have surgery done  NEXT MD VISIT: PRN  OBJECTIVE:  Note: Objective measures were completed at Evaluation unless otherwise noted.  DIAGNOSTIC FINDINGS:  DEGENERATIVE CHANGES: Moderate disc height loss at C5-6 and C6-7. Small central disc-osteophyte complex at C6-7. Unchanged severe right  neural foraminal narrowing at C6-7. Unchanged moderate right and severe left neural foraminal narrowing at C5-6 due to facet arthropathy and uncovertebral joint spurring.I  IMPRESSION: 1. Severe spinal canal stenosis at L1-2, L2-3, and L4-5 levels. 2. Severe right neural foraminal narrowing at L2-3 and severe bilateral neural foraminal narrowing at L4-5. 3. Levoscoliotic curvature of the lumbar spine with 7 mm left lateral listhesis of L3 on L4 and 10 mm right lateral listhesis of L1 on L2. 4. No significant spinal canal stenosis in the cervical or thoracic spine. 5. Moderate right and severe left neural foraminal narrowing at C5-6. 6. Severe right neural foraminal narrowing at C6-7. 7. 10 mm solid lung nodule in the left lower lobe. Per Fleischner society recommendations, consider CT, PET/CT, or tissue sampling at 3 months.  PATIENT SURVEYS:  Modified Oswestry:  MODIFIED OSWESTRY DISABILITY SCALE   Date: 10/22/23 Score Date: 11/18/23  Pain intensity  3 =  Pain medication provides me with moderate relief from pain.  2. Personal care (washing, dressing, etc.)  1 =  I can take care of myself normally, but it increases my pain.  3. Lifting  3 = Pain prevents me from lifting heavy weights, but I can manage light to medium weights if they are conveniently positioned  4. Walking  3 =  Pain prevents me from walking more than  mile.  5. Sitting  2 =  Pain prevents me from sitting more than 1 hour.  6. Standing  3 =  Pain prevents me from standing more than 1/2 hour.  7. Sleeping  1 = I can sleep well only by using pain medication.  8. Social Life  2 = Pain prevents me from participating in more energetic activities (eg. sports, dancing).  9. Traveling  1 =  I can travel anywhere, but it increases my pain.  10. Employment/ Homemaking  3 = Pain prevents me from doing anything but light duties.  Total 28/50; 56% 22/50= 44%   Interpretation of scores: Score Category Description  0-20% Minimal  Disability The patient can cope with most living activities. Usually no treatment is indicated apart from advice on lifting, sitting and exercise  21-40% Moderate Disability The patient experiences more pain and difficulty with sitting, lifting and standing. Travel and social life are more difficult and they may be disabled from work. Personal care, sexual activity and sleeping are not grossly affected, and the patient can usually be managed by conservative means  41-60% Severe Disability Pain remains the main problem in this group, but activities of daily living are affected. These patients require a detailed investigation  61-80% Crippled Back pain impinges on all aspects of the patient's life. Positive intervention is required  81-100% Bed-bound These patients are either bed-bound or exaggerating their symptoms  Bluford FORBES Zoe DELENA Karon DELENA, et al. Surgery versus conservative management of stable thoracolumbar fracture: the PRESTO feasibility RCT. [place] ([place]): [employer]; 2021 Nov. [facility] Assessment, No. 25.62.) Appendix 3, Oswestry Disability Index category descriptors. Available from: Findjewelers.cz  Minimally Clinically Important Difference (MCID) = 12.8%  COGNITION: Overall cognitive status: Within functional limits for tasks assessed     SENSATION: Both fingers go numb sometimes  MUSCLE LENGTH: Hamstrings: test next visit POSTURE: forward head, increased thoracic kyphosis, and flexed trunk   PALPATION: Right shoulder elevated; right rib hump; tenderness mid back and low back noted  LUMBAR ROM:  AROM eval  Flexion 65, tightness in hamstinrgs, BUE support on walker  Extension 15  Right lateral flexion 6  Left lateral flexion 11  Right rotation Unable, legs giving out  Left rotation Unable, legs giving out   (  Blank rows = not tested)  LOWER EXTREMITY ROM:     Active  Right eval Left eval  Hip flexion    Hip  extension    Hip abduction    Hip adduction    Hip internal rotation    Hip external rotation    Knee flexion    Knee extension    Ankle dorsiflexion    Ankle plantarflexion    Ankle inversion    Ankle eversion     (Blank rows = not tested)  LOWER EXTREMITY MMT:    MMT Right eval Left eval Right 11/18/23` Left 11/18/23  Hip flexion 3+ 4 4- 4+  Hip extension   3/5 3+/5  Hip abduction      Hip adduction      Hip internal rotation      Hip external rotation      Knee flexion      Knee extension 3+ 4+ 4/5 4+  Ankle dorsiflexion 4- 4+ 4/5 4+  Ankle plantarflexion      Ankle inversion      Ankle eversion       (Blank rows = not tested)   FUNCTIONAL TESTS:  5 times sit to stand: 58.21 sec SLS unable  11/18/23:  216 with rollator, no increased pain 5STS: 36.23 SLS Rt 1, Lt 3  GAIT: Distance walked: 50 ft in clinic Assistive device utilized: Walker - 4 wheeled Level of assistance: Modified independence Comments: flexed trunk; slow gait speed  TREATMENT DATE:  12/01/23: -Nustep, 7 minutes, level 2 resistance, seat 9 -Floor to stand Transfer, 6x, verbal cueing and demonstration, educated on going from tall kneeling, half kneeling, track runner stance with both hands to ground and sticking bottom up/extending BLE/knees, walking back leg up to front leg while keeping both hands down at ground until she's able to roll up to standing -Heel taps at stair case, BUE for support, 10x   10x w/ 1 lb AW donned -STS from mat table, Feet on foam mat, 2x10, CGA  -Side stepping at edge of mat, RTB around knees, 6x, CGA for safety   11/27/23:   -Floor to stand transfer, x4, verbal and demonstration key, pt education on falls training and half kneeling position emphasized. Pt requires occasional CGA for safety. Quadruped: Janeal Dog 10x 5 holds Seated - Piriformis stretch 3x 30 Standing: - squats front of chair 10x - Tandem stance 3x 30  - Lunges onto 6in step 5x 3  holds intermittent HHA   11/21/2023  Therapeutic Exercise: -Nustep, 5 minutes, level 3 resistance, pt cued for 60-70 spm -Supine bridges 2 sets of 4 reps, 3 second holds, symptomatic, pt cued for max hip extension, BTB at knees Neuromuscular Re-education: -Standing balance on blue mat, narrow BOS, EC; tandem stance, EO; SLS, EO ; 30 second bouts, CGA required for safety -Bird dogs, 1 set of 5 reps, pt holds for 3 seconds, bilaterally, pt cued for UE/LE positioning Therapeutic Activity: -Floor to stand transfer, x5, verbal and demonstration key, pt education on falls training and half kneeling position emphasized. Pt requires occasional CGA for safety.   PATIENT EDUCATION:  Education details: Patient educated on exam findings, POC, scope of PT, HEP, and what to expect next visit. Person educated: Patient Education method: Explanation, Demonstration, and Handouts Education comprehension: verbalized understanding, returned demonstration, verbal cues required, and tactile cues required  HOME EXERCISE PROGRAM: 10/22/23: Decompression exercises 1-5  Access Code: [email or web address] URL: [email or web address] Date: 10/22/2023 Prepared by: [facility]  Exercises -  Sitting to Supine Roll  - 1 x daily - 7 x weekly - 1 sets - 1 reps  11/18/23: - Supine Bridge  - 1 x daily - 7 x weekly - 3 sets - 10 reps - 5 hold - Sidelying Hip Abduction  - 1 x daily - 7 x weekly - 3 sets - 10 reps - Sit to Stand  - 2 x daily - 7 x weekly - 3 sets - 10 reps  11/27/23: Piriformis stretch 3x 30  ASSESSMENT:  CLINICAL IMPRESSION: Patient reports little pain in low back or LE this date. Began on NuStep for general warm up and LE strengthening. Patient reports continued difficulty with floor transfers. Educated on a different way to complete this date (see above), as pt reports no chair to utilize at home with patient reporting improved performance today. Remainder of session focused on LE strengthening  in combination with balance. Required CGA throughout. Pt ambulates some in session without rollator and demonstrates balance deficits. Reports a little soreness but no increased pain at end of session. Patient will benefit from continued skilled physical therapy in order to address current deficits and improve overall function/QOL.     Eval: Patient is a 68 y.o. female who was seen today for physical therapy evaluation and treatment for M41.9 (ICD-10-CM) - Scoliosis, unspecified.  Patient demonstrates muscle weakness, reduced ROM, and fascial restrictions which are likely contributing to symptoms of pain and are negatively impacting patient ability to perform ADLs and functional mobility tasks. Patient will benefit from skilled physical therapy services to address these deficits to reduce pain and improve level of function with ADLs and functional mobility tasks.   OBJECTIVE IMPAIRMENTS: Abnormal gait, decreased activity tolerance, decreased balance, decreased ROM, decreased strength, impaired perceived functional ability, and pain.   ACTIVITY LIMITATIONS: carrying, lifting, bending, sitting, standing, sleeping, transfers, bed mobility, and locomotion level  PARTICIPATION LIMITATIONS: meal prep, cleaning, laundry, shopping, and community activity  REHAB POTENTIAL: Good  CLINICAL DECISION MAKING: Evolving/moderate complexity  EVALUATION COMPLEXITY: Moderate   GOALS: Goals reviewed with patient? Yes  SHORT TERM GOALS: Target date: 11/05/2023  patient will be independent with initial HEP  Baseline:  11/18/23:  Reports compliance iwht exercises every other day Goal status: MET  2.  Patient will report 30% improvement overall  Baseline:  11/18/23:  Reports improvements by 35% Goal status: MET  LONG TERM GOALS: Target date: 12/19/23  Patient will be independent in self management strategies to improve quality of life and functional outcomes.  Baseline:  Goal status: Ongoing  2.   Patient will report 50% improvement overall  Baseline: 11/18/23: Reports improvements by 35% Goal status: Ongoing  3.  Patient will improve Modified Oswestry score by 8 points (20/50 or less)  to demonstrate improved perceived function  Baseline: 28/50; 11/18/23: 22/50 Goal status: MET  4.  Patient will improve 5 times sit to stand score to 15 sec or less to demonstrate improved functional mobility and increased leg strength.    Baseline: 58.2 sec;  11/18/23: 5STS 36.23 Goal status: MET  5.   Patient will increase right leg MMT's to 4+ to 5/5 to allow navigation of steps without gait deviation or loss of balance  Baseline:  Goal status: Ongoing  6.  Patient will able to stand SLS on each leg x 5 to demonstrate improved functional balance  Baseline: SLS Rt 1, Lt 3 Goal status: Ongoing  PLAN:  PT FREQUENCY: 2x/week  PT DURATION: 4 weeks  PLANNED INTERVENTIONS: 02835-  PT Re-evaluation, 97110-Therapeutic exercises, 97530- Therapeutic activity, [id number]- Neuromuscular re-education, [phone]- Self Care, 02859- Manual therapy, [phone]- Gait training, [phone]- Orthotic Fit/training, [phone]- Canalith repositioning, [id number]- Aquatic Therapy, 97760- Splinting, 97597- Wound care (first 20 sq cm), 97598- Wound care (each additional 20 sq cm)Patient/Family education, Balance training, Stair training, Taping, Dry Needling, Joint mobilization, Joint manipulation, Spinal manipulation, Spinal mobilization, Scar mobilization, and DME instructions. SABRA  PLAN FOR NEXT SESSION: decompression exercises; postural strengthening; balance; unable to tolerate long periods of standing.  Work on floor to standing and balance.  Progress functional strengthening as able.   5:41 PM, 12/01/23 Rosaria Settler, PT, DPT [facility] [place]

## 2023-12-02 ENCOUNTER — Ambulatory Visit (HOSPITAL_COMMUNITY)

## 2023-12-09 ENCOUNTER — Ambulatory Visit

## 2023-12-09 VITALS — Ht 66.0 in | Wt 163.0 lb

## 2023-12-09 DIAGNOSIS — Z0000 Encounter for general adult medical examination without abnormal findings: Secondary | ICD-10-CM | POA: Diagnosis not present

## 2023-12-09 DIAGNOSIS — Z1231 Encounter for screening mammogram for malignant neoplasm of breast: Secondary | ICD-10-CM

## 2023-12-09 NOTE — Patient Instructions (Signed)
 Ms. Crounse,  Thank you for taking the time for your Medicare Wellness Visit. I appreciate your continued commitment to your health goals. Please review the care plan we discussed, and feel free to reach out if I can assist you further.  Please note that Annual Wellness Visits do not include a physical exam. Some assessments may be limited, especially if the visit was conducted virtually. If needed, we may recommend an in-person follow-up with your provider.  Ongoing Care Seeing your primary care provider every 3 to 6 months helps us  monitor your health and provide consistent, personalized care.   Referrals If a referral was made during today's visit and you haven't received any updates within two weeks, please contact the referred provider directly to check on the status.  Mammogram at [facility] Call [phone] to schedule your screening No perfumes, lotions, or deodorants the day of your screening. You can schedule your mammogram through mychart!   Recommended Screenings:  Health Maintenance  Topic Date Due   COVID-19 Vaccine (6 - 2025-26 season) 09/08/2023   Breast Cancer Screening  09/18/2023   Osteoporosis screening with Bone Density Scan  08/15/2024   Medicare Annual Wellness Visit  12/08/2024   Colon Cancer Screening  12/14/2026   DTaP/Tdap/Td vaccine (2 - Td or Tdap) 07/25/2031   Pneumococcal Vaccine for age over 102  Completed   Flu Shot  Completed   Hepatitis C Screening  Completed   Zoster (Shingles) Vaccine  Completed   Meningitis B Vaccine  Aged Out       12/09/2023    8:37 AM  Advanced Directives  Does Patient Have a Medical Advance Directive? No  Would patient like information on creating a medical advance directive? No - Patient declined    Vision: Annual vision screenings are recommended for early detection of glaucoma, cataracts, and diabetic retinopathy. These exams can also reveal signs of chronic conditions such as diabetes and high blood  pressure.  Dental: Annual dental screenings help detect early signs of oral cancer, gum disease, and other conditions linked to overall health, including heart disease and diabetes.  Please see the attached documents for additional preventive care recommendations.

## 2023-12-09 NOTE — Progress Notes (Signed)
 Chief Complaint  Patient presents with   Medicare Wellness      Subjective:   Erica Gallegos is a 68 y.o. female who presents for a Medicare Annual Wellness Visit.  Visit info / Clinical Intake: Medicare Wellness Visit Type:: Subsequent Annual Wellness Visit Persons participating in visit and providing information:: patient Medicare Wellness Visit Mode:: Telephone If telephone:: video declined Since this visit was completed virtually, some vitals may be partially provided or unavailable. Missing vitals are due to the limitations of the virtual format.: Documented vitals are patient reported If Telephone or Video please confirm:: I connected with patient using audio/video enable telemedicine. I verified patient identity with two identifiers, discussed telehealth limitations, and patient agreed to proceed. Patient Location:: home Provider Location:: home office Interpreter Needed?: No Pre-visit prep was completed: yes AWV questionnaire completed by patient prior to visit?: no Living arrangements:: lives with spouse/significant other Patient's Overall Health Status Rating: (!) fair Typical amount of pain: (!) a lot Does pain affect daily life?: (!) yes Are you currently prescribed opioids?: (!) yes  Dietary Habits and Nutritional Risks How many meals a day?: 2 Eats fruit and vegetables daily?: yes Most meals are obtained by: preparing own meals; having others provide food In the last 2 weeks, have you had any of the following?: none Diabetic:: no  Functional Status Activities of Daily Living (to include ambulation/medication): Independent Ambulation: Independent with device- listed below Home Assistive Devices/Equipment: Walker (specify Type) Medication Administration: Independent Home Management (perform basic housework or laundry): Needs assistance (comment) Manage your own finances?: yes Primary transportation is: family / friends Concerns about vision?: no *vision  screening is required for WTM* Concerns about hearing?: no  Fall Screening Falls in the past year?: 1 Number of falls in past year: 1 Was there an injury with Fall?: 1 Fall Risk Category Calculator: 3 Patient Fall Risk Level: High Fall Risk  Fall Risk Patient at Risk for Falls Due to: History of fall(s); Impaired balance/gait; Impaired mobility Fall risk Follow up: Falls evaluation completed; Education provided; Falls prevention discussed  Home and Transportation Safety: All rugs have non-skid backing?: yes All stairs or steps have railings?: yes Grab bars in the bathtub or shower?: yes Have non-skid surface in bathtub or shower?: yes Good home lighting?: yes Regular seat belt use?: yes Hospital stays in the last year:: no  Cognitive Assessment Difficulty concentrating, remembering, or making decisions? : no Will 6CIT or Mini Cog be Completed: no 6CIT or Mini Cog Declined: patient alert, oriented, able to answer questions appropriately and recall recent events  Advance Directives (For Healthcare) Does Patient Have a Medical Advance Directive?: No Would patient like information on creating a medical advance directive?: No - Patient declined  Reviewed/Updated  Reviewed/Updated: Reviewed All (Medical, Surgical, Family, Medications, Allergies, Care Teams, Patient Goals)    Allergies (verified) Atenolol and Amlodipine   Current Medications (verified) Outpatient Encounter Medications as of 12/09/2023  Medication Sig   acetaminophen  (TYLENOL ) 325 MG tablet Take 650 mg by mouth every 6 (six) hours as needed.   atorvastatin  (LIPITOR) 40 MG tablet Take 1 tablet (40 mg total) by mouth daily.   buPROPion  (WELLBUTRIN  XL) 300 MG 24 hr tablet Take 1 tablet (300 mg total) by mouth every morning.   carvedilol  (COREG ) 12.5 MG tablet TAKE 1 TABLET(12.5 MG) BY MOUTH TWICE DAILY   chlorthalidone  (HYGROTON ) 25 MG tablet TAKE 1 AND 1/2 TABLETS(37.5 MG) BY MOUTH DAILY   Cholecalciferol   (VITAMIN D3) 50 MCG (2000 UT) TABS  Take 2,000 Units by mouth daily.   cyclobenzaprine  (FLEXERIL ) 10 MG tablet TAKE 1 TABLET AT BEDTIME AS NEEDED FOR BACK OR NERVE PAIN   DULoxetine  (CYMBALTA ) 30 MG capsule Take 3 capsules (90 mg total) by mouth daily.   esomeprazole  (NEXIUM ) 20 MG capsule Take 1 capsule (20 mg total) by mouth daily at 12 noon.   ezetimibe  (ZETIA ) 10 MG tablet Take 1 tablet (10 mg total) by mouth daily.   gabapentin  (NEURONTIN ) 300 MG capsule TAKE 1 CAPSULE EVERY MORNING AND EVERY EVENING THEN TAKE 2 CAPSULES AT BEDTIME   hydrALAZINE  (APRESOLINE ) 100 MG tablet TAKE 1 TABLET TWICE A DAY   irbesartan  (AVAPRO ) 300 MG tablet Take 1 tablet (300 mg total) by mouth daily.   levothyroxine  (SYNTHROID ) 25 MCG tablet Take 1 tablet (25 mcg total) by mouth daily before breakfast.   Melatonin 10 MG TABS Take 10 mg by mouth at bedtime.   Omega-3 Fatty Acids (FISH OIL) 1000 MG CAPS Take 4,000 mg by mouth daily.   oxyCODONE-acetaminophen  (PERCOCET/ROXICET) 5-325 MG tablet Take 1 tablet by mouth every 6 (six) hours as needed.   spironolactone  (ALDACTONE ) 25 MG tablet TAKE 1 TABLET(25 MG) BY MOUTH DAILY   vitamin B-12 (CYANOCOBALAMIN ) 500 MCG tablet Take 500 mcg by mouth daily.   vitamin C (ASCORBIC ACID) 500 MG tablet Take 500 mg by mouth daily.   No facility-administered encounter medications on file as of 12/09/2023.    History: Past Medical History:  Diagnosis Date   Allergy    Anxiety    Arthritis    Cataract    Removed/both eyes   Chronic kidney disease    Small cyst   Complication of anesthesia    Patient woke up during anesthesia with bunionectomy   GERD (gastroesophageal reflux disease)    HLD (hyperlipidemia)    HTN (hypertension)    Major depressive disorder    Migraine with aura    Neuromuscular disorder (HCC) 2018/approximately   Spinal stenosis   PONV (postoperative nausea and vomiting)    RAS (renal artery stenosis)    Scoliosis    Sleep apnea    Sleep related  hypoxia    Spinal stenosis    Past Surgical History:  Procedure Laterality Date   BUNIONECTOMY     CATARACT EXTRACTION     CHOLECYSTECTOMY     COLONOSCOPY WITH PROPOFOL  N/A 12/13/2021   Procedure: COLONOSCOPY WITH PROPOFOL ;  Surgeon: Shaaron Lamar HERO, MD;  Location: [facility];  Service: Endoscopy;  Laterality: N/A;  8:00am, asa 2   ESOPHAGOGASTRODUODENOSCOPY (EGD) WITH PROPOFOL  N/A 04/05/2021   Procedure: ESOPHAGOGASTRODUODENOSCOPY (EGD) WITH PROPOFOL ;  Surgeon: Shaaron Lamar HERO, MD;  Location: [facility];  Service: Endoscopy;  Laterality: N/A;  10:15am   ESOPHAGOGASTRODUODENOSCOPY (EGD) WITH PROPOFOL  N/A 12/13/2021   Procedure: ESOPHAGOGASTRODUODENOSCOPY (EGD) WITH PROPOFOL ;  Surgeon: Shaaron Lamar HERO, MD;  Location: [facility];  Service: Endoscopy;  Laterality: N/A;   EYE SURGERY     MALONEY DILATION N/A 04/05/2021   Procedure: MALONEY DILATION;  Surgeon: Shaaron Lamar HERO, MD;  Location: [facility];  Service: Endoscopy;  Laterality: N/A;   MALONEY DILATION N/A 12/13/2021   Procedure: AGAPITO DILATION;  Surgeon: Shaaron Lamar HERO, MD;  Location: [facility];  Service: Endoscopy;  Laterality: N/A;   TUBAL LIGATION  1987   Family History  Problem Relation Age of Onset   Cancer Mother    Hypertension Mother    Diabetes Mother    Heart disease Mother  Hyperlipidemia Mother    Cancer Father    Stroke Father    Hypertension Father    Heart disease Father    Hypertension Sister    Lupus Sister    Arthritis Sister    Hypertension Sister    Colon polyps Sister    Cancer Brother    Brain cancer Brother    Non-Hodgkin's lymphoma Brother    Anxiety disorder Daughter    Vision loss Sister    Colon cancer Neg Hx    Social History   Occupational History   Not on file  Tobacco Use   Smoking status: Former    Types: Cigarettes   Smokeless tobacco: Never   Tobacco comments:    She quit smoking in the early 90's  Vaping Use   Vaping status: Former  Substance and  Sexual Activity   Alcohol use: Yes    Comment: seldom   Drug use: Not Currently    Types: Marijuana    Comment: CBD cream; Marijuana a couple times a week. helps with her anxiety and migraines.   Sexual activity: Yes    Birth control/protection: None, Post-menopausal   Tobacco Counseling Counseling given: Yes Tobacco comments: She quit smoking in the early 90's  SDOH Screenings   Food Insecurity: No Food Insecurity (12/09/2023)  Housing: Low Risk  (12/09/2023)  Transportation Needs: No Transportation Needs (12/09/2023)  Utilities: Not At Risk (12/09/2023)  Alcohol Screen: Low Risk  (08/30/2022)  Depression (PHQ2-9): Medium Risk (12/09/2023)  Financial Resource Strain: Low Risk  (08/30/2022)  Physical Activity: Patient Declined (12/09/2023)  Social Connections: Moderately Integrated (12/09/2023)  Stress: Stress Concern Present (12/09/2023)  Tobacco Use: Medium Risk (12/09/2023)  Health Literacy: Adequate Health Literacy (12/09/2023)   See flowsheets for full screening details  Depression Screen PHQ 2 & 9 Depression Scale- Over the past 2 weeks, how often have you been bothered by any of the following problems? Little interest or pleasure in doing things: 1 Feeling down, depressed, or hopeless (PHQ Adolescent also includes...irritable): 1 PHQ-2 Total Score: 2 Trouble falling or staying asleep, or sleeping too much: 1 Feeling tired or having little energy: 3 Poor appetite or overeating (PHQ Adolescent also includes...weight loss): 1 Feeling bad about yourself - or that you are a failure or have let yourself or your family down: 1 Trouble concentrating on things, such as reading the newspaper or watching television (PHQ Adolescent also includes...like school work): 0 Moving or speaking so slowly that other people could have noticed. Or the opposite - being so fidgety or restless that you have been moving around a lot more than usual: 0 Thoughts that you would be better off dead, or of  hurting yourself in some way: 0 PHQ-9 Total Score: 8 If you checked off any problems, how difficult have these problems made it for you to do your work, take care of things at home, or get along with other people?: Somewhat difficult  Depression Treatment Depression Interventions/Treatment : Currently on Treatment     Goals Addressed             This Visit's Progress    Patient Stated   On track    Be able to walk straight and have nerves released. Also wants to travel more and be able to drive again.             Objective:    Today's Vitals   12/09/23 0836  Weight: 163 lb (73.9 kg)  Height: 5' 6 (1.676 m)  Body mass index is 26.31 kg/m.  Hearing/Vision screen Hearing Screening - Comments:: Patient denies any hearing difficulties.   Vision Screening - Comments:: Wears rx glasses - up to date with routine eye exams with  [facility] Immunizations and Health Maintenance Health Maintenance  Topic Date Due   COVID-19 Vaccine (6 - 2025-26 season) 09/08/2023   Mammogram  09/18/2023   Bone Density Scan  08/15/2024   Medicare Annual Wellness (AWV)  12/08/2024   Colonoscopy  12/14/2026   DTaP/Tdap/Td (2 - Td or Tdap) 07/25/2031   Pneumococcal Vaccine: 50+ Years  Completed   Influenza Vaccine  Completed   Hepatitis C Screening  Completed   Zoster Vaccines- Shingrix   Completed   Meningococcal B Vaccine  Aged Out        Assessment/Plan:  This is a routine wellness examination for Alexarae.  Patient Care Team: Bevely Doffing, FNP as PCP - General (Family Medicine) Alvan, Dorn FALCON, MD as PCP - Cardiology (Cardiology) Shaaron Lamar HERO, MD as Consulting Physician (Gastroenterology)  I have personally reviewed and noted the following in the patient's chart:   Medical and social history Use of alcohol, tobacco or illicit drugs  Current medications and supplements including opioid prescriptions. Functional ability and status Nutritional  status Physical activity Advanced directives List of other physicians Hospitalizations, surgeries, and ER visits in previous 12 months Vitals Screenings to include cognitive, depression, and falls Referrals and appointments  Orders Placed This Encounter  Procedures   MM 3D SCREENING MAMMOGRAM BILATERAL BREAST    Standing Status:   Future    Expiration Date:   12/08/2024    Reason for Exam (SYMPTOM  OR DIAGNOSIS REQUIRED):   breast cancer screening    Preferred imaging location?:   [facility]   In addition, I have reviewed and discussed with patient certain preventive protocols, quality metrics, and best practice recommendations. A written personalized care plan for preventive services as well as general preventive health recommendations were provided to patient.   Illyanna Petillo, CMA   12/09/2023   Return December 10, 2024 at 8:40 am,, for your yearly Medicare Wellness Visit in person.  After Visit Summary: (MyChart) Due to this being a telephonic visit, the after visit summary with patients personalized plan was offered to patient via MyChart   Nurse Notes: mammogram ordered

## 2023-12-11 DIAGNOSIS — M48062 Spinal stenosis, lumbar region with neurogenic claudication: Secondary | ICD-10-CM | POA: Diagnosis not present

## 2023-12-18 ENCOUNTER — Other Ambulatory Visit: Payer: Self-pay

## 2023-12-18 ENCOUNTER — Ambulatory Visit (HOSPITAL_COMMUNITY): Admitting: Psychiatry

## 2023-12-18 ENCOUNTER — Encounter (HOSPITAL_COMMUNITY): Payer: Self-pay | Admitting: Psychiatry

## 2023-12-18 VITALS — BP 128/76 | HR 67 | Ht 66.0 in | Wt 168.0 lb

## 2023-12-18 DIAGNOSIS — F330 Major depressive disorder, recurrent, mild: Secondary | ICD-10-CM

## 2023-12-18 DIAGNOSIS — F419 Anxiety disorder, unspecified: Secondary | ICD-10-CM | POA: Diagnosis not present

## 2023-12-18 DIAGNOSIS — F4310 Post-traumatic stress disorder, unspecified: Secondary | ICD-10-CM | POA: Diagnosis not present

## 2023-12-18 MED ORDER — DULOXETINE HCL 30 MG PO CPEP: 90.0000 mg | ORAL_CAPSULE | Freq: Every day | ORAL | 1 refills | Status: AC

## 2023-12-18 MED ORDER — BUPROPION HCL ER (XL) 300 MG PO TB24: 300.0000 mg | ORAL_TABLET | ORAL | 1 refills | Status: AC

## 2023-12-18 MED ORDER — AMITRIPTYLINE HCL 10 MG PO TABS: 10.0000 mg | ORAL_TABLET | Freq: Every evening | ORAL | 0 refills | Status: AC | PRN
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
## 2023-12-26 ENCOUNTER — Ambulatory Visit (HOSPITAL_COMMUNITY)

## 2023-12-26 ENCOUNTER — Encounter (HOSPITAL_COMMUNITY): Payer: Self-pay

## 2023-12-26 DIAGNOSIS — M5459 Other low back pain: Secondary | ICD-10-CM | POA: Diagnosis present

## 2023-12-26 DIAGNOSIS — R2689 Other abnormalities of gait and mobility: Secondary | ICD-10-CM | POA: Diagnosis present

## 2023-12-26 DIAGNOSIS — R293 Abnormal posture: Secondary | ICD-10-CM | POA: Diagnosis present

## 2023-12-26 DIAGNOSIS — R262 Difficulty in walking, not elsewhere classified: Secondary | ICD-10-CM | POA: Diagnosis present

## 2023-12-26 NOTE — Therapy (Signed)
 " OUTPATIENT PHYSICAL THERAPY THORACOLUMBAR TREATMENT Progress Note Reporting Period 10/22/23 to 12/26/23  See note below for Objective Data and Assessment of Progress/Goals.      Patient Name: Erica Gallegos MRN: [id number] DOB:1955-08-24, 68 y.o., female Today's Date: 12/26/2023  END OF SESSION:  PT End of Session - 12/26/23 0858     Visit Number 9    Number of Visits 13    Date for Recertification  02/07/24    Authorization Type BCBS Medicare    Authorization Time Period no auth needed    Progress Note Due on Visit 19    PT Start Time 0859    PT Stop Time 0939    PT Time Calculation (min) 40 min    Equipment Utilized During Treatment Gait belt    Activity Tolerance Patient tolerated treatment well;No increased pain    Behavior During Therapy WFL for tasks assessed/performed              Past Medical History:  Diagnosis Date   Allergy    Anxiety    Arthritis    Cataract    Removed/both eyes   Chronic kidney disease    Small cyst   Complication of anesthesia    Patient woke up during anesthesia with bunionectomy   GERD (gastroesophageal reflux disease)    HLD (hyperlipidemia)    HTN (hypertension)    Major depressive disorder    Migraine with aura    Neuromuscular disorder (HCC) 2018/approximately   Spinal stenosis   PONV (postoperative nausea and vomiting)    RAS (renal artery stenosis)    Scoliosis    Sleep apnea    Sleep related hypoxia    Spinal stenosis    Past Surgical History:  Procedure Laterality Date   BUNIONECTOMY     CATARACT EXTRACTION     CHOLECYSTECTOMY     COLONOSCOPY WITH PROPOFOL  N/A 12/13/2021   Procedure: COLONOSCOPY WITH PROPOFOL ;  Surgeon: Shaaron Lamar CHRISTELLA, MD;  [facility];  Service: Endoscopy;  Laterality: N/A;  8:00am, asa 2   ESOPHAGOGASTRODUODENOSCOPY (EGD) WITH PROPOFOL  N/A 04/05/2021   Procedure: ESOPHAGOGASTRODUODENOSCOPY (EGD) WITH PROPOFOL ;  Surgeon: Shaaron Lamar CHRISTELLA, MD;  [facility]: [facility];   Service: Endoscopy;  Laterality: N/A;  10:15am   ESOPHAGOGASTRODUODENOSCOPY (EGD) WITH PROPOFOL  N/A 12/13/2021   Procedure: ESOPHAGOGASTRODUODENOSCOPY (EGD) WITH PROPOFOL ;  Surgeon: Shaaron Lamar CHRISTELLA, MD;  [facility]: [facility];  Service: Endoscopy;  Laterality: N/A;   EYE SURGERY     MALONEY DILATION N/A 04/05/2021   Procedure: MALONEY DILATION;  Surgeon: Shaaron Lamar CHRISTELLA, MD;  [facility]: [facility];  Service: Endoscopy;  Laterality: N/A;   MALONEY DILATION N/A 12/13/2021   Procedure: AGAPITO DILATION;  Surgeon: Shaaron Lamar CHRISTELLA, MD;  Location: [facility];  Service: Endoscopy;  Laterality: N/A;   TUBAL LIGATION  1987   Patient Active Problem List   Diagnosis Date Noted   Hypothyroidism 11/12/2023   Solitary pulmonary nodule on lung CT 09/22/2023   Stage 3b chronic kidney disease (HCC) 05/15/2023   Itching 12/26/2022   PTSD (post-traumatic stress disorder) 11/12/2022   Abnormal thyroid  blood test 06/27/2022   Hypercalcemia 06/27/2022   Need for pneumococcal 20-valent conjugate vaccination 06/18/2022   Anxiety and depression 02/26/2022   OSA on CPAP 02/26/2022   Ocular pain, right eye 02/26/2022   Need for influenza vaccination 10/23/2021   Need for shingles vaccine 10/23/2021   Memory problem 08/21/2021   Osteoporosis 07/24/2021   Vitamin D  deficiency 07/24/2021  Neuropathy 03/22/2021   Hyperlipidemia 03/22/2021   Spinal stenosis 03/22/2021   Fall 03/22/2021   Right shoulder pain 03/22/2021   Left knee pain 03/22/2021   Encounter for screening fecal occult blood testing 08/28/2020   Encounter for gynecological examination with Papanicolaou smear of cervix 08/28/2020   Dysphagia 03/02/2020   Hypertension 03/02/2020   Nausea without vomiting 03/02/2020   Gastroesophageal reflux disease 03/02/2020    PCP: Melvenia Mungo, MD  REFERRING PROVIDER: Darnella Dorn SAUNDERS, MD  REFERRING DIAG: M41.9 (ICD-10-CM) - Scoliosis, unspecified  Rationale for Evaluation and Treatment:  Rehabilitation  THERAPY DIAG:  Other low back pain - Plan: PT plan of care cert/re-cert  Difficulty in walking, not elsewhere classified - Plan: PT plan of care cert/re-cert  Abnormal posture - Plan: PT plan of care cert/re-cert  Other abnormalities of gait and mobility - Plan: PT plan of care cert/re-cert  ONSET DATE: back pain for years  SUBJECTIVE:                                                                                                                                                                                           SUBJECTIVE STATEMENT:  Pt states the back pain was helped tremendously by then injections on the fourth. Pt states back pain is flaring a little this morning 2.5/10 had to take a pain pill this morning. Pt states HEP has not been going to well, only performed once this past week. Pt states she has improved about 50% since the start of therapy.  Pt states she would like to have the back surgery this year.    Eval: Arrives with rollater; I need balance too; referred for scoliosis and also has spinal stenosis; takes reclast  for osteoporosis.  My goal is to have surgery done.  Originally injured her back in the 70's.  Worst pain in the last 3 years; was originally seeing Dr. Carollee.  Now seeing Dr. Darnella who referred for therapy.  If she has surgery will be at [facility] or [facility]; has had to start using a rollator over the past year.  MD told me it (therapy) would not help  PERTINENT HISTORY:  Anxiety, HTN, migraine, osteoporosis, PTSD; spinal stenosis  PAIN:  Are you having pain? Yes: NPRS scale: 3/10; 3/10 at best; 10/10 at worst Pain location: low back and but now up to right shoulder and arm, right leg can give out Pain description: aching sore constant but can worsen with activity Aggravating factors: standing and walking, turning over in bed Relieving factors: sit, rest  PRECAUTIONS: Fall  RED FLAGS: None   WEIGHT BEARING RESTRICTIONS:  No  FALLS:  Has patient fallen in last 6 months? Yes. Number of falls 1  LIVING ENVIRONMENT: Lives with: lives with their spouse Lives in: House/apartment Stairs: No ramp entry Has following equipment at home: Vannie - 4 wheeled  OCCUPATION: retired  PLOF: Independent with household mobility with device and Independent with community mobility with device  PATIENT GOALS: to have surgery done  NEXT MD VISIT: PRN  OBJECTIVE:  Note: Objective measures were completed at Evaluation unless otherwise noted.  DIAGNOSTIC FINDINGS:  DEGENERATIVE CHANGES: Moderate disc height loss at C5-6 and C6-7. Small central disc-osteophyte complex at C6-7. Unchanged severe right neural foraminal narrowing at C6-7. Unchanged moderate right and severe left neural foraminal narrowing at C5-6 due to facet arthropathy and uncovertebral joint spurring.I  IMPRESSION: 1. Severe spinal canal stenosis at L1-2, L2-3, and L4-5 levels. 2. Severe right neural foraminal narrowing at L2-3 and severe bilateral neural foraminal narrowing at L4-5. 3. Levoscoliotic curvature of the lumbar spine with 7 mm left lateral listhesis of L3 on L4 and 10 mm right lateral listhesis of L1 on L2. 4. No significant spinal canal stenosis in the cervical or thoracic spine. 5. Moderate right and severe left neural foraminal narrowing at C5-6. 6. Severe right neural foraminal narrowing at C6-7. 7. 10 mm solid lung nodule in the left lower lobe. Per Fleischner society recommendations, consider CT, PET/CT, or tissue sampling at 3 months.  PATIENT SURVEYS:  Modified Oswestry:  MODIFIED OSWESTRY DISABILITY SCALE   Date: 10/22/23 Score Date: 11/18/23  Pain intensity  3 =  Pain medication provides me with moderate relief from pain.  2. Personal care (washing, dressing, etc.)  1 =  I can take care of myself normally, but it increases my pain.  3. Lifting  3 = Pain prevents me from lifting heavy weights, but I can manage light to medium  weights if they are conveniently positioned  4. Walking  3 =  Pain prevents me from walking more than  mile.  5. Sitting  2 =  Pain prevents me from sitting more than 1 hour.  6. Standing  3 =  Pain prevents me from standing more than 1/2 hour.  7. Sleeping  1 = I can sleep well only by using pain medication.  8. Social Life  2 = Pain prevents me from participating in more energetic activities (eg. sports, dancing).  9. Traveling  1 =  I can travel anywhere, but it increases my pain.  10. Employment/ Homemaking  3 = Pain prevents me from doing anything but light duties.  Total 28/50; 56% 22/50= 44%   Interpretation of scores: Score Category Description  0-20% Minimal Disability The patient can cope with most living activities. Usually no treatment is indicated apart from advice on lifting, sitting and exercise  21-40% Moderate Disability The patient experiences more pain and difficulty with sitting, lifting and standing. Travel and social life are more difficult and they may be disabled from work. Personal care, sexual activity and sleeping are not grossly affected, and the patient can usually be managed by conservative means  41-60% Severe Disability Pain remains the main problem in this group, but activities of daily living are affected. These patients require a detailed investigation  61-80% Crippled Back pain impinges on all aspects of the patients life. Positive intervention is required  81-100% Bed-bound These patients are either bed-bound or exaggerating their symptoms  Bluford BRAVO, [place] DELENA Karon DELENA, et al. Surgery versus conservative management of stable thoracolumbar fracture: the PRESTO  feasibility RCT. [place] ([place]): [employer]; 2021 Nov. [facility] Technology Assessment, No. 25.62.) Appendix 3, Oswestry Disability Index category descriptors. Available from: Findjewelers.cz  Minimally Clinically Important Difference (MCID) =  12.8%  COGNITION: Overall cognitive status: Within functional limits for tasks assessed     SENSATION: Both fingers go numb sometimes  MUSCLE LENGTH: Hamstrings: test next visit POSTURE: forward head, increased thoracic kyphosis, and flexed trunk   PALPATION: Right shoulder elevated; right rib hump; tenderness mid back and low back noted  LUMBAR ROM:  AROM eval  Flexion 65, tightness in hamstinrgs, BUE support on walker  Extension 15  Right lateral flexion 6  Left lateral flexion 11  Right rotation Unable, legs giving out  Left rotation Unable, legs giving out   (Blank rows = not tested)  LOWER EXTREMITY ROM:     Active  Right eval Left eval  Hip flexion    Hip extension    Hip abduction    Hip adduction    Hip internal rotation    Hip external rotation    Knee flexion    Knee extension    Ankle dorsiflexion    Ankle plantarflexion    Ankle inversion    Ankle eversion     (Blank rows = not tested)  LOWER EXTREMITY MMT:    MMT Right eval Left eval Right 11/18/23` Left 11/18/23 Right 12/26/23 Left 12/26/23  Hip flexion 3+ 4 4- 4+ 4- 4+  Hip extension   3/5 3+/5 4- 4+  Hip abduction     4- 4  Hip adduction     4- 4  Hip internal rotation        Hip external rotation        Knee flexion     4- 5  Knee extension 3+ 4+ 4/5 4+ 4- 5  Ankle dorsiflexion 4- 4+ 4/5 4+ 4- 5  Ankle plantarflexion        Ankle inversion        Ankle eversion         (Blank rows = not tested)   FUNCTIONAL TESTS:  5 times sit to stand: 58.21 sec SLS unable  11/18/23:  216 with rollator, no increased pain 5STS: 36.23 SLS Rt 1, Lt 3    12/26/23:   5TSTS: 20.99 seconds, no increased pain in back   SLS: R: 2.4 seconds L: 5.01 seconds   : 328 feet, with rolator, back feels good but RLE pain present GAIT: Distance walked: 50 ft in clinic Assistive device utilized: Walker - 4 wheeled Level of assistance: Modified independence Comments: flexed trunk; slow gait  speed  TREATMENT DATE:  12/26/2023  PN: goals tracked, 5TSTS, SLS, strength assessed, , education on POC and importance of HEP compliance Therapeutic Exercise: -Nustep, 5 minutes, level 4 resistance, pt cued for 70-80 spm -Lateral stepping, 2 laps, 10 feet per lap, with GTB around ankles, pt cued for upright posture and slight bend in knees -Monster walks, 2 laps 10 feet per lap, with GTB around ankles, pt cued for upright posture and athletic stance Neuromuscular Re-education: -Forward T in parallel bars, 1 set of 5 reps, bilaterally, pt cued for core activation and upright posture -Standing balance, tandem stance, 1 bout of 30 seconds bilaterally   12/01/23: -Nustep, 7 minutes, level 2 resistance, seat 9 -Floor to stand Transfer, 6x, verbal cueing and demonstration, educated on going from tall kneeling, half kneeling, track runner stance with both hands to ground and sticking bottom up/extending BLE/knees, walking  back leg up to front leg while keeping both hands down at ground until she's able to roll up to standing -Heel taps at stair case, BUE for support, 10x   10x w/ 1 lb AW donned -STS from mat table, Feet on foam mat, 2x10, CGA  -Side stepping at edge of mat, RTB around knees, 6x, CGA for safety   11/27/23:   -Floor to stand transfer, x4, verbal and demonstration key, pt education on falls training and half kneeling position emphasized. Pt requires occasional CGA for safety. Quadruped: Janeal Dog 10x 5 holds Seated - Piriformis stretch 3x 30 Standing: - squats front of chair 10x - Tandem stance 3x 30  - Lunges onto 6in step 5x 3 holds intermittent HHA  PATIENT EDUCATION:  Education details: Patient educated on exam findings, POC, scope of PT, HEP, and what to expect next visit. Person educated: Patient Education method: Explanation, Demonstration, and Handouts Education comprehension: verbalized understanding, returned demonstration, verbal cues required, and  tactile cues required  HOME EXERCISE PROGRAM: Access Code: [email or web address] URL: [email or web address] Date: 12/26/2023 Prepared by: Lang Ada  Exercises - Sitting to Supine Roll  - 1 x daily - 7 x weekly - 1 sets - 1 reps - Supine Bridge  - 1 x daily - 7 x weekly - 3 sets - 10 reps - 5 hold - Sidelying Hip Abduction  - 1 x daily - 7 x weekly - 3 sets - 10 reps - Sit to Stand  - 2 x daily - 7 x weekly - 3 sets - 10 reps - Seated Figure 4 Piriformis Stretch  - 2 x daily - 7 x weekly - 1 sets - 3 reps - 30 hold - Single Leg Stance with Support  - 1 x daily - 7 x weekly - 1 sets - 3 reps - 30 hold - Tandem Stance with Support  - 1 x daily - 7 x weekly - 1 sets - 3 reps - 30 hold - Side Stepping with Resistance at Thighs  - 1 x daily - 7 x weekly - 3 sets - 10 reps - Bird Dog  - 1 x daily - 7 x weekly - 3 sets - 10 reps - Forward T with Counter Support  - 1 x daily - 7 x weekly - 2 sets - 5 reps - 1 hold  ASSESSMENT:  CLINICAL IMPRESSION: Patient continues to demonstrate increased LE strength, increased gait quality/endurance and balance. Patient also demonstrates ability to meet 5/8 initial therapy goals and has demonstrated progress towards others as well. Patient able to progress dynamic balance and core activation exercises today with monster walks and tandem stance balance activities, good performance with verbal cueing. Plan to extend cert to end of January for continued progress towards therapy goals. HEP updated this date. Patient would continue to benefit from skilled physical therapy for increased endurance with ambulation, increased LE strength, and improved balance for improved quality of life, improved independence with gait training and continued progress towards therapy goals.      Eval: Patient is a 68 y.o. female who was seen today for physical therapy evaluation and treatment for M41.9 (ICD-10-CM) - Scoliosis, unspecified.  Patient demonstrates muscle weakness,  reduced ROM, and fascial restrictions which are likely contributing to symptoms of pain and are negatively impacting patient ability to perform ADLs and functional mobility tasks. Patient will benefit from skilled physical therapy services to address these deficits to reduce pain and improve level of function with ADLs and functional  mobility tasks.   OBJECTIVE IMPAIRMENTS: Abnormal gait, decreased activity tolerance, decreased balance, decreased ROM, decreased strength, impaired perceived functional ability, and pain.   ACTIVITY LIMITATIONS: carrying, lifting, bending, sitting, standing, sleeping, transfers, bed mobility, and locomotion level  PARTICIPATION LIMITATIONS: meal prep, cleaning, laundry, shopping, and community activity  REHAB POTENTIAL: Good  CLINICAL DECISION MAKING: Evolving/moderate complexity  EVALUATION COMPLEXITY: Moderate   GOALS: Goals reviewed with patient? Yes  SHORT TERM GOALS: Target date: 11/05/2023  patient will be independent with initial HEP  Baseline:  11/18/23:  Reports compliance iwht exercises every other day Goal status: MET  2.  Patient will report 30% improvement overall  Baseline:  11/18/23:  Reports improvements by 35% Goal status: MET  LONG TERM GOALS: Target date: 12/19/23  Patient will be independent in self management strategies to improve quality of life and functional outcomes.  Baseline:  Goal status: Ongoing  2.  Patient will report 50% improvement overall  Baseline: 11/18/23: Reports improvements by 35% Goal status: MET  3.  Patient will improve Modified Oswestry score by 8 points (20/50 or less)  to demonstrate improved perceived function  Baseline: 28/50; 11/18/23: 22/50 Goal status: MET  4.  Patient will improve 5 times sit to stand score to 15 sec or less to demonstrate improved functional mobility and increased leg strength.    Baseline: 58.2 sec;  11/18/23: 5STS 36.23 Goal status: MET  5.   Patient will  increase right leg MMT's to 4+ to 5/5 to allow navigation of steps without gait deviation or loss of balance  Baseline:  Goal status: Ongoing  6.  Patient will able to stand SLS on each leg x 5 to demonstrate improved functional balance  Baseline: SLS R: 2.4 seconds L: 5.01 seconds Goal status: Partially MET  PLAN:  PT FREQUENCY: 2x/week  PT DURATION: 4 weeks  PLANNED INTERVENTIONS: 97164- PT Re-evaluation, 97110-Therapeutic exercises, 97530- Therapeutic activity, 97112- Neuromuscular re-education, 97535- Self Care, [id number]- Manual therapy, [id number]- Gait training, [phone]- Orthotic Fit/training, [phone]- Canalith repositioning, [id number]- Aquatic Therapy, 97760- Splinting, [id number]- Wound care (first 20 sq cm), 97598- Wound care (each additional 20 sq cm)Patient/Family education, Balance training, Stair training, Taping, Dry Needling, Joint mobilization, Joint manipulation, Spinal manipulation, Spinal mobilization, Scar mobilization, and DME instructions. SABRA  PLAN FOR NEXT SESSION: decompression exercises; postural strengthening; balance; unable to tolerate long periods of standing.  Work on floor to standing and balance.  Progress functional strengthening as able. Plan to extend cert to end of January for continued progress towards therapy goals.   Lang Ada, PT, DPT [facility] Office: [phone] 9:48 AM, 12/26/2023  "

## 2024-01-12 ENCOUNTER — Ambulatory Visit (HOSPITAL_COMMUNITY)

## 2024-01-12 ENCOUNTER — Encounter (HOSPITAL_COMMUNITY): Payer: Self-pay

## 2024-01-12 DIAGNOSIS — R293 Abnormal posture: Secondary | ICD-10-CM | POA: Insufficient documentation

## 2024-01-12 DIAGNOSIS — M5459 Other low back pain: Secondary | ICD-10-CM | POA: Diagnosis present

## 2024-01-12 DIAGNOSIS — R2689 Other abnormalities of gait and mobility: Secondary | ICD-10-CM | POA: Insufficient documentation

## 2024-01-12 DIAGNOSIS — R262 Difficulty in walking, not elsewhere classified: Secondary | ICD-10-CM | POA: Diagnosis present

## 2024-01-12 NOTE — Therapy (Signed)
 " OUTPATIENT PHYSICAL THERAPY THORACOLUMBAR TREATMENT  Patient Name: Erica Gallegos MRN: [id number] DOB:07-07-1955, 69 y.o., female Today's Date: 01/12/2024  END OF SESSION:  PT End of Session - 01/12/24 1103     Visit Number 10    Number of Visits 13    Date for Recertification  02/07/24    Authorization Type BCBS Medicare    Authorization Time Period no auth needed    Progress Note Due on Visit 19    PT Start Time 1105    PT Stop Time 1157    PT Time Calculation (min) 52 min    Equipment Utilized During Treatment --    Activity Tolerance Patient tolerated treatment well;No increased pain    Behavior During Therapy WFL for tasks assessed/performed              Past Medical History:  Diagnosis Date   Allergy    Anxiety    Arthritis    Cataract    Removed/both eyes   Chronic kidney disease    Small cyst   Complication of anesthesia    Patient woke up during anesthesia with bunionectomy   GERD (gastroesophageal reflux disease)    HLD (hyperlipidemia)    HTN (hypertension)    Major depressive disorder    Migraine with aura    Neuromuscular disorder (HCC) 2018/approximately   Spinal stenosis   PONV (postoperative nausea and vomiting)    RAS (renal artery stenosis)    Scoliosis    Sleep apnea    Sleep related hypoxia    Spinal stenosis    Past Surgical History:  Procedure Laterality Date   BUNIONECTOMY     CATARACT EXTRACTION     CHOLECYSTECTOMY     COLONOSCOPY WITH PROPOFOL  N/A 12/13/2021   Procedure: COLONOSCOPY WITH PROPOFOL ;  Surgeon: Shaaron Lamar CHRISTELLA, MD;  [facility];  Service: Endoscopy;  Laterality: N/A;  8:00am, asa 2   ESOPHAGOGASTRODUODENOSCOPY (EGD) WITH PROPOFOL  N/A 04/05/2021   Procedure: ESOPHAGOGASTRODUODENOSCOPY (EGD) WITH PROPOFOL ;  Surgeon: Shaaron Lamar CHRISTELLA, MD;  [facility]: [facility];  Service: Endoscopy;  Laterality: N/A;  10:15am   ESOPHAGOGASTRODUODENOSCOPY (EGD) WITH PROPOFOL  N/A 12/13/2021   Procedure:  ESOPHAGOGASTRODUODENOSCOPY (EGD) WITH PROPOFOL ;  Surgeon: Shaaron Lamar CHRISTELLA, MD;  [facility]: [facility];  Service: Endoscopy;  Laterality: N/A;   EYE SURGERY     MALONEY DILATION N/A 04/05/2021   Procedure: MALONEY DILATION;  Surgeon: Shaaron Lamar CHRISTELLA, MD;  [facility]: [facility];  Service: Endoscopy;  Laterality: N/A;   MALONEY DILATION N/A 12/13/2021   Procedure: AGAPITO DILATION;  Surgeon: Shaaron Lamar CHRISTELLA, MD;  Location: [facility];  Service: Endoscopy;  Laterality: N/A;   TUBAL LIGATION  1987   Patient Active Problem List   Diagnosis Date Noted   Hypothyroidism 11/12/2023   Solitary pulmonary nodule on lung CT 09/22/2023   Stage 3b chronic kidney disease (HCC) 05/15/2023   Itching 12/26/2022   PTSD (post-traumatic stress disorder) 11/12/2022   Abnormal thyroid  blood test 06/27/2022   Hypercalcemia 06/27/2022   Need for pneumococcal 20-valent conjugate vaccination 06/18/2022   Anxiety and depression 02/26/2022   OSA on CPAP 02/26/2022   Ocular pain, right eye 02/26/2022   Need for influenza vaccination 10/23/2021   Need for shingles vaccine 10/23/2021   Memory problem 08/21/2021   Osteoporosis 07/24/2021   Vitamin D  deficiency 07/24/2021   Neuropathy 03/22/2021   Hyperlipidemia 03/22/2021   Spinal stenosis 03/22/2021   Fall 03/22/2021   Right shoulder pain 03/22/2021  Left knee pain 03/22/2021   Encounter for screening fecal occult blood testing 08/28/2020   Encounter for gynecological examination with Papanicolaou smear of cervix 08/28/2020   Dysphagia 03/02/2020   Hypertension 03/02/2020   Nausea without vomiting 03/02/2020   Gastroesophageal reflux disease 03/02/2020    PCP: Melvenia Mungo, MD  REFERRING PROVIDER: Darnella Dorn SAUNDERS, MD  REFERRING DIAG: M41.9 (ICD-10-CM) - Scoliosis, unspecified  Rationale for Evaluation and Treatment: Rehabilitation  THERAPY DIAG:  Other low back pain  Difficulty in walking, not elsewhere classified  Abnormal  posture  Other abnormalities of gait and mobility  ONSET DATE: back pain for years  SUBJECTIVE:                                                                                                                                                                                           SUBJECTIVE STATEMENT:  Patient reports that her last few days have been really rough. She is hurting bad enough to take her pain medication.    Eval: Arrives with rollater; I need balance too; referred for scoliosis and also has spinal stenosis; takes reclast  for osteoporosis.  My goal is to have surgery done.  Originally injured her back in the 54's.  Worst pain in the last 3 years; was originally seeing Dr. Carollee.  Now seeing Dr. Darnella who referred for therapy.  If she has surgery will be [facility] or [facility]; has had to start using a rollator over the past year.  MD told me it (therapy) would not help  PERTINENT HISTORY:  Anxiety, HTN, migraine, osteoporosis, PTSD; spinal stenosis  PAIN:  Are you having pain? Yes: NPRS scale: 3/10; 3/10 at best; 10/10 at worst Pain location: low back and but now up to right shoulder and arm, right leg can give out Pain description: aching sore constant but can worsen with activity Aggravating factors: standing and walking, turning over in bed Relieving factors: sit, rest  PRECAUTIONS: Fall  RED FLAGS: None   WEIGHT BEARING RESTRICTIONS: No  FALLS:  Has patient fallen in last 6 months? Yes. Number of falls 1  LIVING ENVIRONMENT: Lives with: lives with their spouse Lives in: House/apartment Stairs: No ramp entry Has following equipment at home: Vannie - 4 wheeled  OCCUPATION: retired  PLOF: Independent with household mobility with device and Independent with community mobility with device  PATIENT GOALS: to have surgery done  NEXT MD VISIT: PRN  OBJECTIVE:  Note: Objective measures were completed at Evaluation unless otherwise noted.  DIAGNOSTIC  FINDINGS:  DEGENERATIVE CHANGES: Moderate disc height loss at C5-6 and C6-7. Small central disc-osteophyte complex at C6-7. Unchanged  severe right neural foraminal narrowing at C6-7. Unchanged moderate right and severe left neural foraminal narrowing at C5-6 due to facet arthropathy and uncovertebral joint spurring.I  IMPRESSION: 1. Severe spinal canal stenosis at L1-2, L2-3, and L4-5 levels. 2. Severe right neural foraminal narrowing at L2-3 and severe bilateral neural foraminal narrowing at L4-5. 3. Levoscoliotic curvature of the lumbar spine with 7 mm left lateral listhesis of L3 on L4 and 10 mm right lateral listhesis of L1 on L2. 4. No significant spinal canal stenosis in the cervical or thoracic spine. 5. Moderate right and severe left neural foraminal narrowing at C5-6. 6. Severe right neural foraminal narrowing at C6-7. 7. 10 mm solid lung nodule in the left lower lobe. Per Fleischner society recommendations, consider CT, PET/CT, or tissue sampling at 3 months.  PATIENT SURVEYS:  Modified Oswestry:  MODIFIED OSWESTRY DISABILITY SCALE   Date: 10/22/23 Score Date: 11/18/23  Pain intensity  3 =  Pain medication provides me with moderate relief from pain.  2. Personal care (washing, dressing, etc.)  1 =  I can take care of myself normally, but it increases my pain.  3. Lifting  3 = Pain prevents me from lifting heavy weights, but I can manage light to medium weights if they are conveniently positioned  4. Walking  3 =  Pain prevents me from walking more than  mile.  5. Sitting  2 =  Pain prevents me from sitting more than 1 hour.  6. Standing  3 =  Pain prevents me from standing more than 1/2 hour.  7. Sleeping  1 = I can sleep well only by using pain medication.  8. Social Life  2 = Pain prevents me from participating in more energetic activities (eg. sports, dancing).  9. Traveling  1 =  I can travel anywhere, but it increases my pain.  10. Employment/ Homemaking  3 = Pain  prevents me from doing anything but light duties.  Total 28/50; 56% 22/50= 44%   Interpretation of scores: Score Category Description  0-20% Minimal Disability The patient can cope with most living activities. Usually no treatment is indicated apart from advice on lifting, sitting and exercise  21-40% Moderate Disability The patient experiences more pain and difficulty with sitting, lifting and standing. Travel and social life are more difficult and they may be disabled from work. Personal care, sexual activity and sleeping are not grossly affected, and the patient can usually be managed by conservative means  41-60% Severe Disability Pain remains the main problem in this group, but activities of daily living are affected. These patients require a detailed investigation  61-80% Crippled Back pain impinges on all aspects of the patients life. Positive intervention is required  81-100% Bed-bound These patients are either bed-bound or exaggerating their symptoms  Bluford FORBES Zoe DELENA Karon DELENA, et al. Surgery versus conservative management of stable thoracolumbar fracture: the PRESTO feasibility RCT. [place] ([place]): [employer]; 2021 Nov. [facility] Technology Assessment, No. 25.62.) Appendix 3, Oswestry Disability Index category descriptors. Available from: Findjewelers.cz  Minimally Clinically Important Difference (MCID) = 12.8%  COGNITION: Overall cognitive status: Within functional limits for tasks assessed     SENSATION: Both fingers go numb sometimes  MUSCLE LENGTH: Hamstrings: test next visit POSTURE: forward head, increased thoracic kyphosis, and flexed trunk   PALPATION: Right shoulder elevated; right rib hump; tenderness mid back and low back noted  LUMBAR ROM:  AROM eval  Flexion 65, tightness in hamstinrgs, BUE support on walker  Extension  15  Right lateral flexion 6  Left lateral flexion 11  Right rotation Unable, legs giving out   Left rotation Unable, legs giving out   (Blank rows = not tested)  LOWER EXTREMITY ROM:     Active  Right eval Left eval  Hip flexion    Hip extension    Hip abduction    Hip adduction    Hip internal rotation    Hip external rotation    Knee flexion    Knee extension    Ankle dorsiflexion    Ankle plantarflexion    Ankle inversion    Ankle eversion     (Blank rows = not tested)  LOWER EXTREMITY MMT:    MMT Right eval Left eval Right 11/18/23` Left 11/18/23 Right 12/26/23 Left 12/26/23  Hip flexion 3+ 4 4- 4+ 4- 4+  Hip extension   3/5 3+/5 4- 4+  Hip abduction     4- 4  Hip adduction     4- 4  Hip internal rotation        Hip external rotation        Knee flexion     4- 5  Knee extension 3+ 4+ 4/5 4+ 4- 5  Ankle dorsiflexion 4- 4+ 4/5 4+ 4- 5  Ankle plantarflexion        Ankle inversion        Ankle eversion         (Blank rows = not tested)   FUNCTIONAL TESTS:  5 times sit to stand: 58.21 sec SLS unable  11/18/23:  216 with rollator, no increased pain 5STS: 36.23 SLS Rt 1, Lt 3    12/26/23:   5TSTS: 20.99 seconds, no increased pain in back   SLS: R: 2.4 seconds L: 5.01 seconds   : 328 feet, with rolator, back feels good but RLE pain present GAIT: Distance walked: 50 ft in clinic Assistive device utilized: Walker - 4 wheeled Level of assistance: Modified independence Comments: flexed trunk; slow gait speed  TREATMENT DATE:                                    01/13/24 EXERCISE LOG  Exercise Repetitions and Resistance Comments  Nustep  L4 x 7 minutes    Modified bird dog  20 reps each  At counter   Tandem stance  3 x 30 seconds each    Resisted row  GTB x 20 reps     Resisted pull down  GTB x 2 minutes    Seated hip ADD isometric  2 minutes w/ 5 second hold    Marching on foam  2 minutes  Intermittent UE support from parallel bars   Sit to stand  10 reps  Without UE support   Seated lumbar flexion and rotation  20 reps    Patient  education  Benefits of exercise, back surgery, communicating with her MD, healing, and goals for physical therapy    Blank cell = exercise not performed today   12/26/2023  PN: goals tracked, 5TSTS, SLS, strength assessed, , education on POC and importance of HEP compliance Therapeutic Exercise: -Nustep, 5 minutes, level 4 resistance, pt cued for 70-80 spm -Lateral stepping, 2 laps, 10 feet per lap, with GTB around ankles, pt cued for upright posture and slight bend in knees -Monster walks, 2 laps 10 feet per lap, with GTB around ankles, pt cued for upright  posture and athletic stance Neuromuscular Re-education: -Forward T in parallel bars, 1 set of 5 reps, bilaterally, pt cued for core activation and upright posture -Standing balance, tandem stance, 1 bout of 30 seconds bilaterally   12/01/23: -Nustep, 7 minutes, level 2 resistance, seat 9 -Floor to stand Transfer, 6x, verbal cueing and demonstration, educated on going from tall kneeling, half kneeling, track runner stance with both hands to ground and sticking bottom up/extending BLE/knees, walking back leg up to front leg while keeping both hands down at ground until she's able to roll up to standing -Heel taps at stair case, BUE for support, 10x   10x w/ 1 lb AW donned -STS from mat table, Feet on foam mat, 2x10, CGA  -Side stepping at edge of mat, RTB around knees, 6x, CGA for safety  PATIENT EDUCATION:  Education details: Patient educated on exam findings, POC, scope of PT, HEP, and what to expect next visit. Person educated: Patient Education method: Explanation, Demonstration, and Handouts Education comprehension: verbalized understanding, returned demonstration, verbal cues required, and tactile cues required  HOME EXERCISE PROGRAM: Access Code: [email or web address] URL: [email or web address] Date: 12/26/2023 Prepared by: Lang Ada  Exercises - Sitting to Supine Roll  - 1 x daily - 7 x weekly - 1 sets - 1 reps -  Supine Bridge  - 1 x daily - 7 x weekly - 3 sets - 10 reps - 5 hold - Sidelying Hip Abduction  - 1 x daily - 7 x weekly - 3 sets - 10 reps - Sit to Stand  - 2 x daily - 7 x weekly - 3 sets - 10 reps - Seated Figure 4 Piriformis Stretch  - 2 x daily - 7 x weekly - 1 sets - 3 reps - 30 hold - Single Leg Stance with Support  - 1 x daily - 7 x weekly - 1 sets - 3 reps - 30 hold - Tandem Stance with Support  - 1 x daily - 7 x weekly - 1 sets - 3 reps - 30 hold - Side Stepping with Resistance at Thighs  - 1 x daily - 7 x weekly - 3 sets - 10 reps - Bird Dog  - 1 x daily - 7 x weekly - 3 sets - 10 reps - Forward T with Counter Support  - 1 x daily - 7 x weekly - 2 sets - 5 reps - 1 hold  ASSESSMENT:  CLINICAL IMPRESSION: Patient was progressed with multiple new interventions for improved lumbar stability and postural reeducation. She required moderate multimodal cueing with modified bird dogs for proper biomechanics to prevent lumbar rotation. She was educated on the benefits of physical therapy and exercise. She reported understanding. She reported feeling better upon the conclusion of treatment. Patient continues to require skilled physical therapy to address her remaining impairments to return to her prior level of function.     Eval: Patient is a 69 y.o. female who was seen today for physical therapy evaluation and treatment for M41.9 (ICD-10-CM) - Scoliosis, unspecified.  Patient demonstrates muscle weakness, reduced ROM, and fascial restrictions which are likely contributing to symptoms of pain and are negatively impacting patient ability to perform ADLs and functional mobility tasks. Patient will benefit from skilled physical therapy services to address these deficits to reduce pain and improve level of function with ADLs and functional mobility tasks.   OBJECTIVE IMPAIRMENTS: Abnormal gait, decreased activity tolerance, decreased balance, decreased ROM, decreased strength, impaired perceived  functional ability, and pain.  ACTIVITY LIMITATIONS: carrying, lifting, bending, sitting, standing, sleeping, transfers, bed mobility, and locomotion level  PARTICIPATION LIMITATIONS: meal prep, cleaning, laundry, shopping, and community activity  REHAB POTENTIAL: Good  CLINICAL DECISION MAKING: Evolving/moderate complexity  EVALUATION COMPLEXITY: Moderate   GOALS: Goals reviewed with patient? Yes  SHORT TERM GOALS: Target date: 11/05/2023  patient will be independent with initial HEP  Baseline:  11/18/23:  Reports compliance iwht exercises every other day Goal status: MET  2.  Patient will report 30% improvement overall  Baseline:  11/18/23:  Reports improvements by 35% Goal status: MET  LONG TERM GOALS: Target date: 12/19/23  Patient will be independent in self management strategies to improve quality of life and functional outcomes.  Baseline:  Goal status: Ongoing  2.  Patient will report 50% improvement overall  Baseline: 11/18/23: Reports improvements by 35% Goal status: MET  3.  Patient will improve Modified Oswestry score by 8 points (20/50 or less)  to demonstrate improved perceived function  Baseline: 28/50; 11/18/23: 22/50 Goal status: MET  4.  Patient will improve 5 times sit to stand score to 15 sec or less to demonstrate improved functional mobility and increased leg strength.    Baseline: 58.2 sec;  11/18/23: 5STS 36.23 Goal status: MET  5.   Patient will increase right leg MMT's to 4+ to 5/5 to allow navigation of steps without gait deviation or loss of balance  Baseline:  Goal status: Ongoing  6.  Patient will able to stand SLS on each leg x 5 to demonstrate improved functional balance  Baseline: SLS R: 2.4 seconds L: 5.01 seconds Goal status: Partially MET  PLAN:  PT FREQUENCY: 2x/week  PT DURATION: 4 weeks  PLANNED INTERVENTIONS: 97164- PT Re-evaluation, 97110-Therapeutic exercises, 97530- Therapeutic activity, 97112-  Neuromuscular re-education, 97535- Self Care, [id number]- Manual therapy, [id number]- Gait training, [phone]- Orthotic Fit/training, [phone]- Canalith repositioning, [id number]- Aquatic Therapy, 97760- Splinting, [id number]- Wound care (first 20 sq cm), 97598- Wound care (each additional 20 sq cm)Patient/Family education, Balance training, Stair training, Taping, Dry Needling, Joint mobilization, Joint manipulation, Spinal manipulation, Spinal mobilization, Scar mobilization, and DME instructions. SABRA  PLAN FOR NEXT SESSION: decompression exercises; postural strengthening; balance; unable to tolerate long periods of standing.  Work on floor to standing and balance.  Progress functional strengthening as able. Plan to extend cert to end of January for continued progress towards therapy goals.   Lacinda Fass, PT, DPT  12:26 PM, 01/12/2024  "

## 2024-01-14 ENCOUNTER — Ambulatory Visit (HOSPITAL_COMMUNITY)

## 2024-01-15 ENCOUNTER — Ambulatory Visit (HOSPITAL_COMMUNITY): Admitting: Physical Therapy

## 2024-01-16 ENCOUNTER — Ambulatory Visit (HOSPITAL_COMMUNITY): Admission: RE | Admit: 2024-01-16 | Discharge: 2024-01-16 | Disposition: A | Source: Ambulatory Visit

## 2024-01-16 ENCOUNTER — Encounter (HOSPITAL_COMMUNITY): Payer: Self-pay

## 2024-01-16 DIAGNOSIS — Z1231 Encounter for screening mammogram for malignant neoplasm of breast: Secondary | ICD-10-CM | POA: Insufficient documentation

## 2024-01-19 ENCOUNTER — Ambulatory Visit (HOSPITAL_COMMUNITY)

## 2024-01-19 DIAGNOSIS — R262 Difficulty in walking, not elsewhere classified: Secondary | ICD-10-CM

## 2024-01-19 DIAGNOSIS — M5459 Other low back pain: Secondary | ICD-10-CM

## 2024-01-19 DIAGNOSIS — R293 Abnormal posture: Secondary | ICD-10-CM

## 2024-01-19 NOTE — Therapy (Signed)
 " OUTPATIENT PHYSICAL THERAPY THORACOLUMBAR TREATMENT  Patient Name: Erica Gallegos MRN: [id number] DOB:07/09/1955, 69 y.o., female Today's Date: 01/19/2024  END OF SESSION:  PT End of Session - 01/19/24 1120     Visit Number 11    Number of Visits 13    Date for Recertification  02/07/24    Authorization Type BCBS Medicare    Authorization Time Period no auth needed    Progress Note Due on Visit 19    PT Start Time 1120    PT Stop Time 1200    PT Time Calculation (min) 40 min    Activity Tolerance Patient tolerated treatment well;No increased pain    Behavior During Therapy WFL for tasks assessed/performed              Past Medical History:  Diagnosis Date   Allergy    Anxiety    Arthritis    Cataract    Removed/both eyes   Chronic kidney disease    Small cyst   Complication of anesthesia    Patient woke up during anesthesia with bunionectomy   GERD (gastroesophageal reflux disease)    HLD (hyperlipidemia)    HTN (hypertension)    Major depressive disorder    Migraine with aura    Neuromuscular disorder (HCC) 2018/approximately   Spinal stenosis   PONV (postoperative nausea and vomiting)    RAS (renal artery stenosis)    Scoliosis    Sleep apnea    Sleep related hypoxia    Spinal stenosis    Past Surgical History:  Procedure Laterality Date   BUNIONECTOMY     CATARACT EXTRACTION     CHOLECYSTECTOMY     COLONOSCOPY WITH PROPOFOL  N/A 12/13/2021   Procedure: COLONOSCOPY WITH PROPOFOL ;  Surgeon: Shaaron Lamar CHRISTELLA, MD;  [facility];  Service: Endoscopy;  Laterality: N/A;  8:00am, asa 2   ESOPHAGOGASTRODUODENOSCOPY (EGD) WITH PROPOFOL  N/A 04/05/2021   Procedure: ESOPHAGOGASTRODUODENOSCOPY (EGD) WITH PROPOFOL ;  Surgeon: Shaaron Lamar CHRISTELLA, MD;  [facility]: [facility];  Service: Endoscopy;  Laterality: N/A;  10:15am   ESOPHAGOGASTRODUODENOSCOPY (EGD) WITH PROPOFOL  N/A 12/13/2021   Procedure: ESOPHAGOGASTRODUODENOSCOPY (EGD) WITH PROPOFOL ;  Surgeon:  Shaaron Lamar CHRISTELLA, MD;  [facility]: [facility];  Service: Endoscopy;  Laterality: N/A;   EYE SURGERY     MALONEY DILATION N/A 04/05/2021   Procedure: MALONEY DILATION;  Surgeon: Shaaron Lamar CHRISTELLA, MD;  [facility]: [facility];  Service: Endoscopy;  Laterality: N/A;   MALONEY DILATION N/A 12/13/2021   Procedure: AGAPITO DILATION;  Surgeon: Shaaron Lamar CHRISTELLA, MD;  Location: [facility];  Service: Endoscopy;  Laterality: N/A;   TUBAL LIGATION  1987   Patient Active Problem List   Diagnosis Date Noted   Hypothyroidism 11/12/2023   Solitary pulmonary nodule on lung CT 09/22/2023   Stage 3b chronic kidney disease (HCC) 05/15/2023   Itching 12/26/2022   PTSD (post-traumatic stress disorder) 11/12/2022   Abnormal thyroid  blood test 06/27/2022   Hypercalcemia 06/27/2022   Need for pneumococcal 20-valent conjugate vaccination 06/18/2022   Anxiety and depression 02/26/2022   OSA on CPAP 02/26/2022   Ocular pain, right eye 02/26/2022   Need for influenza vaccination 10/23/2021   Need for shingles vaccine 10/23/2021   Memory problem 08/21/2021   Osteoporosis 07/24/2021   Vitamin D  deficiency 07/24/2021   Neuropathy 03/22/2021   Hyperlipidemia 03/22/2021   Spinal stenosis 03/22/2021   Fall 03/22/2021   Right shoulder pain 03/22/2021   Left knee pain 03/22/2021   Encounter  for screening fecal occult blood testing 08/28/2020   Encounter for gynecological examination with Papanicolaou smear of cervix 08/28/2020   Dysphagia 03/02/2020   Hypertension 03/02/2020   Nausea without vomiting 03/02/2020   Gastroesophageal reflux disease 03/02/2020    PCP: Melvenia Mungo, MD  REFERRING PROVIDER: Darnella Dorn SAUNDERS, MD  REFERRING DIAG: M41.9 (ICD-10-CM) - Scoliosis, unspecified  Rationale for Evaluation and Treatment: Rehabilitation  THERAPY DIAG:  Other low back pain  Difficulty in walking, not elsewhere classified  Abnormal posture  ONSET DATE: back pain for years  SUBJECTIVE:                                                                                                                                                                                            SUBJECTIVE STATEMENT: Pt reports she is feeling good today. Little to no pain. Reports floor transfers are getting easier. HEP going well.     Eval: Arrives with rollater; I need balance too; referred for scoliosis and also has spinal stenosis; takes reclast  for osteoporosis.  My goal is to have surgery done.  Originally injured her back in the 15's.  Worst pain in the last 3 years; was originally seeing Dr. Carollee.  Now seeing Dr. Darnella who referred for therapy.  If she has surgery will be [facility] or [facility]; has had to start using a rollator over the past year.  MD told me it (therapy) would not help  PERTINENT HISTORY:  Anxiety, HTN, migraine, osteoporosis, PTSD; spinal stenosis  PAIN:  Are you having pain? Yes: NPRS scale: 3/10; 3/10 at best; 10/10 at worst Pain location: low back and but now up to right shoulder and arm, right leg can give out Pain description: aching sore constant but can worsen with activity Aggravating factors: standing and walking, turning over in bed Relieving factors: sit, rest  PRECAUTIONS: Fall  RED FLAGS: None   WEIGHT BEARING RESTRICTIONS: No  FALLS:  Has patient fallen in last 6 months? Yes. Number of falls 1  LIVING ENVIRONMENT: Lives with: lives with their spouse Lives in: House/apartment Stairs: No ramp entry Has following equipment at home: Vannie - 4 wheeled  OCCUPATION: retired  PLOF: Independent with household mobility with device and Independent with community mobility with device  PATIENT GOALS: to have surgery done  NEXT MD VISIT: PRN  OBJECTIVE:  Note: Objective measures were completed at Evaluation unless otherwise noted.  DIAGNOSTIC FINDINGS:  DEGENERATIVE CHANGES: Moderate disc height loss at C5-6 and C6-7. Small central  disc-osteophyte complex at C6-7. Unchanged severe right neural foraminal narrowing at C6-7. Unchanged moderate right and severe left neural foraminal  narrowing at C5-6 due to facet arthropathy and uncovertebral joint spurring.I  IMPRESSION: 1. Severe spinal canal stenosis at L1-2, L2-3, and L4-5 levels. 2. Severe right neural foraminal narrowing at L2-3 and severe bilateral neural foraminal narrowing at L4-5. 3. Levoscoliotic curvature of the lumbar spine with 7 mm left lateral listhesis of L3 on L4 and 10 mm right lateral listhesis of L1 on L2. 4. No significant spinal canal stenosis in the cervical or thoracic spine. 5. Moderate right and severe left neural foraminal narrowing at C5-6. 6. Severe right neural foraminal narrowing at C6-7. 7. 10 mm solid lung nodule in the left lower lobe. Per Fleischner society recommendations, consider CT, PET/CT, or tissue sampling at 3 months.  PATIENT SURVEYS:  Modified Oswestry:  MODIFIED OSWESTRY DISABILITY SCALE   Date: 10/22/23 Score Date: 11/18/23  Pain intensity  3 =  Pain medication provides me with moderate relief from pain.  2. Personal care (washing, dressing, etc.)  1 =  I can take care of myself normally, but it increases my pain.  3. Lifting  3 = Pain prevents me from lifting heavy weights, but I can manage light to medium weights if they are conveniently positioned  4. Walking  3 =  Pain prevents me from walking more than  mile.  5. Sitting  2 =  Pain prevents me from sitting more than 1 hour.  6. Standing  3 =  Pain prevents me from standing more than 1/2 hour.  7. Sleeping  1 = I can sleep well only by using pain medication.  8. Social Life  2 = Pain prevents me from participating in more energetic activities (eg. sports, dancing).  9. Traveling  1 =  I can travel anywhere, but it increases my pain.  10. Employment/ Homemaking  3 = Pain prevents me from doing anything but light duties.  Total 28/50; 56% 22/50= 44%    Interpretation of scores: Score Category Description  0-20% Minimal Disability The patient can cope with most living activities. Usually no treatment is indicated apart from advice on lifting, sitting and exercise  21-40% Moderate Disability The patient experiences more pain and difficulty with sitting, lifting and standing. Travel and social life are more difficult and they may be disabled from work. Personal care, sexual activity and sleeping are not grossly affected, and the patient can usually be managed by conservative means  41-60% Severe Disability Pain remains the main problem in this group, but activities of daily living are affected. These patients require a detailed investigation  61-80% Crippled Back pain impinges on all aspects of the patients life. Positive intervention is required  81-100% Bed-bound These patients are either bed-bound or exaggerating their symptoms  Bluford FORBES Zoe DELENA Karon DELENA, et al. Surgery versus conservative management of stable thoracolumbar fracture: the PRESTO feasibility RCT. [place] ([place]): [employer]; 2021 Nov. [facility] Technology Assessment, No. 25.62.) Appendix 3, Oswestry Disability Index category descriptors. Available from: Findjewelers.cz  Minimally Clinically Important Difference (MCID) = 12.8%  COGNITION: Overall cognitive status: Within functional limits for tasks assessed     SENSATION: Both fingers go numb sometimes  MUSCLE LENGTH: Hamstrings: test next visit POSTURE: forward head, increased thoracic kyphosis, and flexed trunk   PALPATION: Right shoulder elevated; right rib hump; tenderness mid back and low back noted  LUMBAR ROM:  AROM eval  Flexion 65, tightness in hamstinrgs, BUE support on walker  Extension 15  Right lateral flexion 6  Left lateral flexion 11  Right rotation Unable,  legs giving out  Left rotation Unable, legs giving out   (Blank rows = not tested)  LOWER  EXTREMITY ROM:     Active  Right eval Left eval  Hip flexion    Hip extension    Hip abduction    Hip adduction    Hip internal rotation    Hip external rotation    Knee flexion    Knee extension    Ankle dorsiflexion    Ankle plantarflexion    Ankle inversion    Ankle eversion     (Blank rows = not tested)  LOWER EXTREMITY MMT:    MMT Right eval Left eval Right 11/18/23` Left 11/18/23 Right 12/26/23 Left 12/26/23  Hip flexion 3+ 4 4- 4+ 4- 4+  Hip extension   3/5 3+/5 4- 4+  Hip abduction     4- 4  Hip adduction     4- 4  Hip internal rotation        Hip external rotation        Knee flexion     4- 5  Knee extension 3+ 4+ 4/5 4+ 4- 5  Ankle dorsiflexion 4- 4+ 4/5 4+ 4- 5  Ankle plantarflexion        Ankle inversion        Ankle eversion         (Blank rows = not tested)   FUNCTIONAL TESTS:  5 times sit to stand: 58.21 sec SLS unable  11/18/23:  216 with rollator, no increased pain 5STS: 36.23 SLS Rt 1, Lt 3    12/26/23:   5TSTS: 20.99 seconds, no increased pain in back   SLS: R: 2.4 seconds L: 5.01 seconds   : 328 feet, with rolator, back feels good but RLE pain present GAIT: Distance walked: 50 ft in clinic Assistive device utilized: Walker - 4 wheeled Level of assistance: Modified independence Comments: flexed trunk; slow gait speed  TREATMENT DATE:  01/19/24: Recumbent bike, seat 11, level 3, 4 min  Resisted rows, GTB,  standing on foam, 15x  Added march, 10x Resisted ext +march, GTB, standing on foam, 8x STS, feet on foam, no UE use, x10 Seated Russian twists with Fpl group, 5 lb, 10x2 Core set with physio ball, 10 holds 2x, pressing BUE down into ball                                      01/13/24 EXERCISE LOG  Exercise Repetitions and Resistance Comments  Nustep  L4 x 7 minutes    Modified bird dog  20 reps each  At counter   Tandem stance  3 x 30 seconds each    Resisted row  GTB x 20 reps     Resisted pull down  GTB x  2 minutes    Seated hip ADD isometric  2 minutes w/ 5 second hold    Marching on foam  2 minutes  Intermittent UE support from parallel bars   Sit to stand  10 reps  Without UE support   Seated lumbar flexion and rotation  20 reps    Patient education  Benefits of exercise, back surgery, communicating with her MD, healing, and goals for physical therapy    Blank cell = exercise not performed today   12/26/2023  PN: goals tracked, 5TSTS, SLS, strength assessed, , education on POC and importance of HEP compliance Therapeutic Exercise: -Nustep,  5 minutes, level 4 resistance, pt cued for 70-80 spm -Lateral stepping, 2 laps, 10 feet per lap, with GTB around ankles, pt cued for upright posture and slight bend in knees -Monster walks, 2 laps 10 feet per lap, with GTB around ankles, pt cued for upright posture and athletic stance Neuromuscular Re-education: -Forward T in parallel bars, 1 set of 5 reps, bilaterally, pt cued for core activation and upright posture -Standing balance, tandem stance, 1 bout of 30 seconds bilaterally    PATIENT EDUCATION:  Education details: Patient educated on exam findings, POC, scope of PT, HEP, and what to expect next visit. Person educated: Patient Education method: Explanation, Demonstration, and Handouts Education comprehension: verbalized understanding, returned demonstration, verbal cues required, and tactile cues required  HOME EXERCISE PROGRAM: Access Code: [email or web address] URL: [email or web address] Date: 12/26/2023 Prepared by: Lang Ada  Exercises - Sitting to Supine Roll  - 1 x daily - 7 x weekly - 1 sets - 1 reps - Supine Bridge  - 1 x daily - 7 x weekly - 3 sets - 10 reps - 5 hold - Sidelying Hip Abduction  - 1 x daily - 7 x weekly - 3 sets - 10 reps - Sit to Stand  - 2 x daily - 7 x weekly - 3 sets - 10 reps - Seated Figure 4 Piriformis Stretch  - 2 x daily - 7 x weekly - 1 sets - 3 reps - 30 hold - Single Leg Stance with  Support  - 1 x daily - 7 x weekly - 1 sets - 3 reps - 30 hold - Tandem Stance with Support  - 1 x daily - 7 x weekly - 1 sets - 3 reps - 30 hold - Side Stepping with Resistance at Thighs  - 1 x daily - 7 x weekly - 3 sets - 10 reps - Bird Dog  - 1 x daily - 7 x weekly - 3 sets - 10 reps - Forward T with Counter Support  - 1 x daily - 7 x weekly - 2 sets - 5 reps - 1 hold  ASSESSMENT:  CLINICAL IMPRESSION: Began session on recumbent bike with increased resistance.. Followed with balance interventions. Pt requires CGA mostly, but min assist at times due to impaired balance. Pt demonstrates continued LE weakness/decreased endurance as she has trouble with last two STS without using UE, requires multiple attempts to complete last few. Ended session with focus on core strengthening. Pt tolerates well. Patient continues to require skilled physical therapy to address her remaining impairments to return to her prior level of function.      Eval: Patient is a 70 y.o. female who was seen today for physical therapy evaluation and treatment for M41.9 (ICD-10-CM) - Scoliosis, unspecified.  Patient demonstrates muscle weakness, reduced ROM, and fascial restrictions which are likely contributing to symptoms of pain and are negatively impacting patient ability to perform ADLs and functional mobility tasks. Patient will benefit from skilled physical therapy services to address these deficits to reduce pain and improve level of function with ADLs and functional mobility tasks.   OBJECTIVE IMPAIRMENTS: Abnormal gait, decreased activity tolerance, decreased balance, decreased ROM, decreased strength, impaired perceived functional ability, and pain.   ACTIVITY LIMITATIONS: carrying, lifting, bending, sitting, standing, sleeping, transfers, bed mobility, and locomotion level  PARTICIPATION LIMITATIONS: meal prep, cleaning, laundry, shopping, and community activity  REHAB POTENTIAL: Good  CLINICAL DECISION MAKING:  Evolving/moderate complexity  EVALUATION COMPLEXITY: Moderate   GOALS: Goals reviewed with patient?  Yes  SHORT TERM GOALS: Target date: 11/05/2023  patient will be independent with initial HEP  Baseline:  11/18/23:  Reports compliance iwht exercises every other day Goal status: MET  2.  Patient will report 30% improvement overall  Baseline:  11/18/23:  Reports improvements by 35% Goal status: MET  LONG TERM GOALS: Target date: 02/07/24  Patient will be independent in self management strategies to improve quality of life and functional outcomes.  Baseline:  Goal status: Ongoing  2.  Patient will report 50% improvement overall  Baseline: 11/18/23: Reports improvements by 35% Goal status: MET  3.  Patient will improve Modified Oswestry score by 8 points (20/50 or less)  to demonstrate improved perceived function  Baseline: 28/50; 11/18/23: 22/50 Goal status: MET  4.  Patient will improve 5 times sit to stand score to 15 sec or less to demonstrate improved functional mobility and increased leg strength.    Baseline: 58.2 sec;  11/18/23: 5STS 36.23 Goal status: MET  5.   Patient will increase right leg MMT's to 4+ to 5/5 to allow navigation of steps without gait deviation or loss of balance  Baseline:  Goal status: Ongoing  6.  Patient will able to stand SLS on each leg x 5 to demonstrate improved functional balance  Baseline: SLS R: 2.4 seconds L: 5.01 seconds Goal status: Partially MET  PLAN:  PT FREQUENCY: 2x/week  PT DURATION: 4 weeks  PLANNED INTERVENTIONS: 97164- PT Re-evaluation, 97110-Therapeutic exercises, 97530- Therapeutic activity, 97112- Neuromuscular re-education, 97535- Self Care, [id number]- Manual therapy, [id number]- Gait training, [phone]- Orthotic Fit/training, [phone]- Canalith repositioning, [id number]- Aquatic Therapy, 97760- Splinting, [id number]- Wound care (first 20 sq cm), 97598- Wound care (each additional 20 sq cm)Patient/Family education, Balance training,  Stair training, Taping, Dry Needling, Joint mobilization, Joint manipulation, Spinal manipulation, Spinal mobilization, Scar mobilization, and DME instructions. SABRA  PLAN FOR NEXT SESSION: decompression exercises; postural strengthening; balance; unable to tolerate long periods of standing.  Work on floor to standing and balance.  Progress functional strengthening as able. Plan to extend cert to end of January for continued progress towards therapy goals.   12:14 PM, 01/19/2024 Rosaria Settler, PT, DPT [facility]  "

## 2024-01-22 ENCOUNTER — Ambulatory Visit (HOSPITAL_COMMUNITY)

## 2024-01-22 ENCOUNTER — Encounter (HOSPITAL_COMMUNITY): Payer: Self-pay

## 2024-01-22 ENCOUNTER — Ambulatory Visit (HOSPITAL_COMMUNITY): Admitting: Licensed Clinical Social Worker

## 2024-01-22 DIAGNOSIS — F330 Major depressive disorder, recurrent, mild: Secondary | ICD-10-CM

## 2024-01-22 DIAGNOSIS — F419 Anxiety disorder, unspecified: Secondary | ICD-10-CM | POA: Diagnosis not present

## 2024-01-22 NOTE — Progress Notes (Signed)
 THERAPIST PROGRESS NOTE   Session Date: 01/22/2024  Session Time: 1305 - 1400  Participation Level: Active  MSE/Presentation: Behavior: Appropriate and Sharing Speech: Normal Thought Process: Coherent and Relevant Cognition: Alert and Appropriate Mood: Euthymic Affect: Congruent Insight: Improving Appearance: Casual  Type of Therapy: Individual Therapy  Treatment Goals addressed:  Outpatient (Current Episode) Show Details  Report  Completed/Met: 2 of 10  Progressing (5) LTG: Reduce frequency, intensity, and duration of depression symptoms so that daily functioning is improved (OP Depression) LTG: Increase coping skills to manage depression and improve ability to perform daily activities (OP Depression) STG: Analisse Randle will reduce frequency of avoidant behaviors by 50% as evidenced by self-report in therapy sessions (Social Interpersonal Effectiveness)  Initial (3) STG: Aileena Iglesia will demonstrate interest in social activities by initiating/joining social activity without prompts (Social Interpersonal Effectiveness) STG: Vivienne Sangiovanni will identify various observable behaviors that engage in social isolation (Social Interpersonal Effectiveness) LTG: I want to engage more in social activities (Social Interpersonal Effectiveness)  Completed/Met (2) LTG: Audreena Sachdeva will score less than 5 on the Generalized Anxiety Disorder 7 Scale (GAD-7) (Anxiety) STG: Report a decrease in anxiety symptoms as evidenced by an overall reduction in anxiety score by a minimum of 25% on the Generalized Anxiety Disorder Scale (GAD-7) (Anxiety)  Progress Towards Goals: Revised reviewed   Interventions: CBT, Motivational Interviewing, Solution Focused, Strength-based, Supportive, and Reframing  Summary: KAROLYN MESSING is a(n) 69 y.o. female, with psych hx consistent with MDD, GAD, PTSD and marital stress, presenting for follow-up therapy session in efforts to improve  management of symptoms.   Patient actively engaged in session, presenting with pleasant mood and congruent affect throughout. Pt participated openly in check-in, reporting overall improved wellbeing and attributing mood stabilization over the past 3-4 months to recent medication adjustments made by Arfeen, MD. Pt noted a significant reduction in prior episodes of tearfulness and described observed improvements in the relationship with spouse, sharing of having initiated a conversation regarding spouses increased irritability and quickness to anger when faced with stressors. Pt additionally discussed recent positive life events, including the birth of a new grandson and time spent with family over the holidays.   Pt engaged in reassessment of depressive and anxious symptoms via PHQ-9 and GAD-7, and participated in annual review and revision of the individualized treatment plan, updating previous goals and exploring incorporation of new goals aligned with current areas of focus.     01/22/2024    1:25 PM 11/17/2023    8:30 AM 09/22/2023   10:00 AM 06/18/2023    9:27 AM  GAD 7 : Generalized Anxiety Score  Nervous, Anxious, on Edge 1 0 0 1  Control/stop worrying 0 0 0 0  Worry too much - different things 1 0 0 1  Trouble relaxing 0 0 0 1  Restless 0 0 0 0  Easily annoyed or irritable 1 0 0 0  Afraid - awful might happen 0 0 0 1  Total GAD 7 Score 3 0 0 4  Anxiety Difficulty Not difficult at all Not difficult at all Not difficult at all Not difficult at all      01/22/2024    1:33 PM 12/09/2023    9:00 AM 11/17/2023    8:29 AM 10/31/2023   11:34 AM 09/22/2023   10:01 AM  Depression screen PHQ 2/9  Decreased Interest 1 1 0 0 0  Down, Depressed, Hopeless 0 1 0 0 0  PHQ - 2 Score 1 2 0 0  0  Altered sleeping 2 1 0  0  Tired, decreased energy 1 3 0  0  Change in appetite 1 1 0  0  Feeling bad or failure about yourself  0 1 0  0  Trouble concentrating 1 0 0  0  Moving slowly or  fidgety/restless 0 0 0  0  Suicidal thoughts 0 0 0  0  PHQ-9 Score 6 8 0  0   Difficult doing work/chores Not difficult at all Somewhat difficult Not difficult at all  Not difficult at all     Data saved with a previous flowsheet row definition   Flowsheet Row ED from 05/15/2023 in [facility] Counselor from 11/12/2022 in [facility] at [facility] Admission (Discharged) from 12/13/2021 in [facility]  C-SSRS RISK CATEGORY No Risk No Risk No Risk    Suicidal/Homicidal: None, No plan to harm self or others  Therapist Response:   Clinician actively greeted pt upon presenting for visit, assessing presenting moods and affect, engaging in check-in, utilizing open ended questions in eliciting recounts of events since prior visit. Actively listened to pt's recounts of events, providing support and validation of expressed thoughts and feelings. Engage patient in review of recent symptoms via PHQ-9 and GAD-7. Engaged in review of individualized treatment goals, eliciting patient's thoughts and feelings surrounding progressions and developing of new goals for continued areas of tx focus. Interventions included CBT to identify patterns in emotional reactivity and relational communication, MI to reinforce pts motivation for sustaining behavioral and relational improvements, and supportive/strengths-based reflections to validate progress and promote continued insight. Therapist provided empathic feedback and reinforced pts active engagement in treatment planning and communication efforts with spouse. Clinician reassessed severity of presenting sxs, and presence of any safety concerns. Patient responded well to interventions. Patient continues to meet criteria for MDD, GAD, and marital stress. Patient will continue to benefit from engagement in outpatient therapy due to being the least restrictive service to meet presenting needs.    []  Cognitive Challenging [x]  Cognitive Refocusing []  Cognitive Reframing  []  Communication Skills []  Compliance Issues []  DBT [x]  Exploration of Coping Patterns [x]  Exploration of Emotions [x]  Exploration of Relationship Patterns []  Guided Imagery []  Interactive Feedback []  Interpersonal Resolutions []  Mindfulness Training []  Preventative Services [x]  Psycho-Education  []  Relaxation/Deep Breathing [x]  Review of Treatment Plan/Progress []  Role-Play/Behavioral Rehearsal  []  Structured Problem Solving [x]  Supportive Reflection []  Symptom Management  []  Other   Homework: None.  Plan: Return again in 3 weeks.  Diagnosis:  Encounter Diagnoses  Name Primary?   MDD (major depressive disorder), recurrent episode, mild Yes   Anxiety     Collaboration of Care: Primary Care Provider AEB Provider documentation available in EHR.  Patient/Guardian was advised Release of Information must be obtained prior to any record release in order to collaborate their care with an outside provider. Patient/Guardian was advised if they have not already done so to contact the registration department to sign all necessary forms in order for us  to release information regarding their care.   Consent: Patient/Guardian gives verbal consent for treatment and assignment of benefits for services provided during this visit. Patient/Guardian expressed understanding and agreed to proceed.   [name], MSW, LCSW 01/22/2024,  1:12 PM

## 2024-01-23 ENCOUNTER — Ambulatory Visit (HOSPITAL_COMMUNITY)

## 2024-01-26 ENCOUNTER — Ambulatory Visit (HOSPITAL_COMMUNITY)

## 2024-01-26 ENCOUNTER — Encounter (HOSPITAL_COMMUNITY): Payer: Self-pay

## 2024-01-26 DIAGNOSIS — R262 Difficulty in walking, not elsewhere classified: Secondary | ICD-10-CM

## 2024-01-26 DIAGNOSIS — M5459 Other low back pain: Secondary | ICD-10-CM | POA: Diagnosis not present

## 2024-01-26 DIAGNOSIS — R293 Abnormal posture: Secondary | ICD-10-CM

## 2024-01-26 DIAGNOSIS — R2689 Other abnormalities of gait and mobility: Secondary | ICD-10-CM

## 2024-01-26 NOTE — Therapy (Signed)
 " OUTPATIENT PHYSICAL THERAPY THORACOLUMBAR TREATMENT  Patient Name: Erica Gallegos MRN: [id number] DOB:10-03-1955, 69 y.o., female Today's Date: 01/26/2024  END OF SESSION:  PT End of Session - 01/26/24 1108     Visit Number 12    Number of Visits 13    Date for Recertification  02/07/24    Authorization Type BCBS Medicare    Authorization Time Period no auth needed    Progress Note Due on Visit 19    PT Start Time 1110    PT Stop Time 1155    PT Time Calculation (min) 45 min    Activity Tolerance Patient tolerated treatment well;No increased pain    Behavior During Therapy WFL for tasks assessed/performed              Past Medical History:  Diagnosis Date   Allergy    Anxiety    Arthritis    Cataract    Removed/both eyes   Chronic kidney disease    Small cyst   Complication of anesthesia    Patient woke up during anesthesia with bunionectomy   GERD (gastroesophageal reflux disease)    HLD (hyperlipidemia)    HTN (hypertension)    Major depressive disorder    Migraine with aura    Neuromuscular disorder (HCC) 2018/approximately   Spinal stenosis   PONV (postoperative nausea and vomiting)    RAS (renal artery stenosis)    Scoliosis    Sleep apnea    Sleep related hypoxia    Spinal stenosis    Past Surgical History:  Procedure Laterality Date   BUNIONECTOMY     CATARACT EXTRACTION     CHOLECYSTECTOMY     COLONOSCOPY WITH PROPOFOL  N/A 12/13/2021   Procedure: COLONOSCOPY WITH PROPOFOL ;  Surgeon: Shaaron Lamar CHRISTELLA, MD;  [facility];  Service: Endoscopy;  Laterality: N/A;  8:00am, asa 2   ESOPHAGOGASTRODUODENOSCOPY (EGD) WITH PROPOFOL  N/A 04/05/2021   Procedure: ESOPHAGOGASTRODUODENOSCOPY (EGD) WITH PROPOFOL ;  Surgeon: Shaaron Lamar CHRISTELLA, MD;  [facility];  Service: Endoscopy;  Laterality: N/A;  10:15am   ESOPHAGOGASTRODUODENOSCOPY (EGD) WITH PROPOFOL  N/A 12/13/2021   Procedure: ESOPHAGOGASTRODUODENOSCOPY (EGD) WITH PROPOFOL ;  Surgeon:  Shaaron Lamar CHRISTELLA, MD;  [facility];  Service: Endoscopy;  Laterality: N/A;   EYE SURGERY     MALONEY DILATION N/A 04/05/2021   Procedure: MALONEY DILATION;  Surgeon: Shaaron Lamar CHRISTELLA, MD;  [facility]: [facility];  Service: Endoscopy;  Laterality: N/A;   MALONEY DILATION N/A 12/13/2021   Procedure: AGAPITO DILATION;  Surgeon: Shaaron Lamar CHRISTELLA, MD;  Location: [facility];  Service: Endoscopy;  Laterality: N/A;   TUBAL LIGATION  1987   Patient Active Problem List   Diagnosis Date Noted   Hypothyroidism 11/12/2023   Solitary pulmonary nodule on lung CT 09/22/2023   Stage 3b chronic kidney disease (HCC) 05/15/2023   Itching 12/26/2022   PTSD (post-traumatic stress disorder) 11/12/2022   Abnormal thyroid  blood test 06/27/2022   Hypercalcemia 06/27/2022   Need for pneumococcal 20-valent conjugate vaccination 06/18/2022   Anxiety and depression 02/26/2022   OSA on CPAP 02/26/2022   Ocular pain, right eye 02/26/2022   Need for influenza vaccination 10/23/2021   Need for shingles vaccine 10/23/2021   Memory problem 08/21/2021   Osteoporosis 07/24/2021   Vitamin D  deficiency 07/24/2021   Neuropathy 03/22/2021   Hyperlipidemia 03/22/2021   Spinal stenosis 03/22/2021   Fall 03/22/2021   Right shoulder pain 03/22/2021   Left knee pain 03/22/2021   Encounter  for screening fecal occult blood testing 08/28/2020   Encounter for gynecological examination with Papanicolaou smear of cervix 08/28/2020   Dysphagia 03/02/2020   Hypertension 03/02/2020   Nausea without vomiting 03/02/2020   Gastroesophageal reflux disease 03/02/2020    PCP: Melvenia Mungo, MD  REFERRING PROVIDER: Darnella Dorn SAUNDERS, MD  REFERRING DIAG: M41.9 (ICD-10-CM) - Scoliosis, unspecified  Rationale for Evaluation and Treatment: Rehabilitation  THERAPY DIAG:  Other low back pain  Difficulty in walking, not elsewhere classified  Abnormal posture  Other abnormalities of gait and mobility  ONSET DATE:  back pain for years  SUBJECTIVE:                                                                                                                                                                                           SUBJECTIVE STATEMENT: Pt reports 2/10 pain for low back today. No falls over the weekend. Slight numbness down RLE start of session   Eval: Arrives with rollater; I need balance too; referred for scoliosis and also has spinal stenosis; takes reclast  for osteoporosis.  My goal is to have surgery done.  Originally injured her back in the 80's.  Worst pain in the last 3 years; was originally seeing Dr. Carollee.  Now seeing Dr. Darnella who referred for therapy.  If she has surgery will be at [facility] or [facility]; has had to start using a rollator over the past year.  MD told me it (therapy) would not help  PERTINENT HISTORY:  Anxiety, HTN, migraine, osteoporosis, PTSD; spinal stenosis  PAIN:  Are you having pain? Yes: NPRS scale: 3/10; 3/10 at best; 10/10 at worst Pain location: low back and but now up to right shoulder and arm, right leg can give out Pain description: aching sore constant but can worsen with activity Aggravating factors: standing and walking, turning over in bed Relieving factors: sit, rest  PRECAUTIONS: Fall  RED FLAGS: None   WEIGHT BEARING RESTRICTIONS: No  FALLS:  Has patient fallen in last 6 months? Yes. Number of falls 1  LIVING ENVIRONMENT: Lives with: lives with their spouse Lives in: House/apartment Stairs: No ramp entry Has following equipment at home: Vannie - 4 wheeled  OCCUPATION: retired  PLOF: Independent with household mobility with device and Independent with community mobility with device  PATIENT GOALS: to have surgery done  NEXT MD VISIT: PRN  OBJECTIVE:  Note: Objective measures were completed at Evaluation unless otherwise noted.  DIAGNOSTIC FINDINGS:  DEGENERATIVE CHANGES: Moderate disc height loss at C5-6 and  C6-7. Small central disc-osteophyte complex at C6-7. Unchanged severe right neural foraminal narrowing at C6-7. Unchanged moderate right  and severe left neural foraminal narrowing at C5-6 due to facet arthropathy and uncovertebral joint spurring.I  IMPRESSION: 1. Severe spinal canal stenosis at L1-2, L2-3, and L4-5 levels. 2. Severe right neural foraminal narrowing at L2-3 and severe bilateral neural foraminal narrowing at L4-5. 3. Levoscoliotic curvature of the lumbar spine with 7 mm left lateral listhesis of L3 on L4 and 10 mm right lateral listhesis of L1 on L2. 4. No significant spinal canal stenosis in the cervical or thoracic spine. 5. Moderate right and severe left neural foraminal narrowing at C5-6. 6. Severe right neural foraminal narrowing at C6-7. 7. 10 mm solid lung nodule in the left lower lobe. Per Fleischner society recommendations, consider CT, PET/CT, or tissue sampling at 3 months.  PATIENT SURVEYS:  Modified Oswestry:  MODIFIED OSWESTRY DISABILITY SCALE   Date: 10/22/23 Score Date: 11/18/23  Pain intensity  3 =  Pain medication provides me with moderate relief from pain.  2. Personal care (washing, dressing, etc.)  1 =  I can take care of myself normally, but it increases my pain.  3. Lifting  3 = Pain prevents me from lifting heavy weights, but I can manage light to medium weights if they are conveniently positioned  4. Walking  3 =  Pain prevents me from walking more than  mile.  5. Sitting  2 =  Pain prevents me from sitting more than 1 hour.  6. Standing  3 =  Pain prevents me from standing more than 1/2 hour.  7. Sleeping  1 = I can sleep well only by using pain medication.  8. Social Life  2 = Pain prevents me from participating in more energetic activities (eg. sports, dancing).  9. Traveling  1 =  I can travel anywhere, but it increases my pain.  10. Employment/ Homemaking  3 = Pain prevents me from doing anything but light duties.  Total 28/50; 56% 22/50=  44%   Interpretation of scores: Score Category Description  0-20% Minimal Disability The patient can cope with most living activities. Usually no treatment is indicated apart from advice on lifting, sitting and exercise  21-40% Moderate Disability The patient experiences more pain and difficulty with sitting, lifting and standing. Travel and social life are more difficult and they may be disabled from work. Personal care, sexual activity and sleeping are not grossly affected, and the patient can usually be managed by conservative means  41-60% Severe Disability Pain remains the main problem in this group, but activities of daily living are affected. These patients require a detailed investigation  61-80% Crippled Back pain impinges on all aspects of the patients life. Positive intervention is required  81-100% Bed-bound These patients are either bed-bound or exaggerating their symptoms  Bluford FORBES Zoe DELENA Karon DELENA, et al. Surgery versus conservative management of stable thoracolumbar fracture: the PRESTO feasibility RCT. [place] ([place]): [employer]; 2021 Nov. [facility] Technology Assessment, No. 25.62.) Appendix 3, Oswestry Disability Index category descriptors. Available from: Findjewelers.cz  Minimally Clinically Important Difference (MCID) = 12.8%  COGNITION: Overall cognitive status: Within functional limits for tasks assessed     SENSATION: Both fingers go numb sometimes  MUSCLE LENGTH: Hamstrings: test next visit POSTURE: forward head, increased thoracic kyphosis, and flexed trunk   PALPATION: Right shoulder elevated; right rib hump; tenderness mid back and low back noted  LUMBAR ROM:  AROM eval  Flexion 65, tightness in hamstinrgs, BUE support on walker  Extension 15  Right lateral flexion 6  Left lateral flexion  11  Right rotation Unable, legs giving out  Left rotation Unable, legs giving out   (Blank rows = not tested)  LOWER  EXTREMITY ROM:     Active  Right eval Left eval  Hip flexion    Hip extension    Hip abduction    Hip adduction    Hip internal rotation    Hip external rotation    Knee flexion    Knee extension    Ankle dorsiflexion    Ankle plantarflexion    Ankle inversion    Ankle eversion     (Blank rows = not tested)  LOWER EXTREMITY MMT:    MMT Right eval Left eval Right 11/18/23` Left 11/18/23 Right 12/26/23 Left 12/26/23  Hip flexion 3+ 4 4- 4+ 4- 4+  Hip extension   3/5 3+/5 4- 4+  Hip abduction     4- 4  Hip adduction     4- 4  Hip internal rotation        Hip external rotation        Knee flexion     4- 5  Knee extension 3+ 4+ 4/5 4+ 4- 5  Ankle dorsiflexion 4- 4+ 4/5 4+ 4- 5  Ankle plantarflexion        Ankle inversion        Ankle eversion         (Blank rows = not tested)   FUNCTIONAL TESTS:  5 times sit to stand: 58.21 sec SLS unable  11/18/23:  216 with rollator, no increased pain 5STS: 36.23 SLS Rt 1, Lt 3    12/26/23:   5TSTS: 20.99 seconds, no increased pain in back   SLS: R: 2.4 seconds L: 5.01 seconds   : 328 feet, with rolator, back feels good but RLE pain present GAIT: Distance walked: 50 ft in clinic Assistive device utilized: Walker - 4 wheeled Level of assistance: Modified independence Comments: flexed trunk; slow gait speed  TREATMENT DATE:  01/26/24: Hip vectors, 4 lb AW on each LE, 2x10 Step ups, 4 lb. AW on each LE, 1'--> 1'15 BOSU mini squats, 2x10, CGA for safety BOSU mini lunges, 2x10 Standing lumbar ext over // bars, 5 holds, 10x Seated piriformis stretch position, 2' hold Ambulation trial with SPC, 83 ft, min assist throughout for balance and LE weakness    01/19/24: Recumbent bike, seat 11, level 3, 4 min  Resisted rows, GTB,  standing on foam, 15x  Added march, 10x Resisted ext +march, GTB, standing on foam, 8x STS, feet on foam, no UE use, x10 Seated Russian twists with Fpl group, 5 lb, 10x2 Core set  with physio ball, 10 holds 2x, pressing BUE down into ball                                    01/13/24 EXERCISE LOG  Exercise Repetitions and Resistance Comments  Nustep  L4 x 7 minutes    Modified bird dog  20 reps each  At counter   Tandem stance  3 x 30 seconds each    Resisted row  GTB x 20 reps     Resisted pull down  GTB x 2 minutes    Seated hip ADD isometric  2 minutes w/ 5 second hold    Marching on foam  2 minutes  Intermittent UE support from parallel bars   Sit to stand  10 reps  Without  UE support   Seated lumbar flexion and rotation  20 reps    Patient education  Benefits of exercise, back surgery, communicating with her MD, healing, and goals for physical therapy    Blank cell = exercise not performed today       PATIENT EDUCATION:  Education details: Patient educated on exam findings, POC, scope of PT, HEP, and what to expect next visit. Person educated: Patient Education method: Explanation, Demonstration, and Handouts Education comprehension: verbalized understanding, returned demonstration, verbal cues required, and tactile cues required  HOME EXERCISE PROGRAM: Access Code: [email or web address] URL: [email or web address] Date: 12/26/2023 Prepared by: Lang Ada  Exercises - Sitting to Supine Roll  - 1 x daily - 7 x weekly - 1 sets - 1 reps - Supine Bridge  - 1 x daily - 7 x weekly - 3 sets - 10 reps - 5 hold - Sidelying Hip Abduction  - 1 x daily - 7 x weekly - 3 sets - 10 reps - Sit to Stand  - 2 x daily - 7 x weekly - 3 sets - 10 reps - Seated Figure 4 Piriformis Stretch  - 2 x daily - 7 x weekly - 1 sets - 3 reps - 30 hold - Single Leg Stance with Support  - 1 x daily - 7 x weekly - 1 sets - 3 reps - 30 hold - Tandem Stance with Support  - 1 x daily - 7 x weekly - 1 sets - 3 reps - 30 hold - Side Stepping with Resistance at Thighs  - 1 x daily - 7 x weekly - 3 sets - 10 reps - Bird Dog  - 1 x daily - 7 x weekly - 3 sets - 10 reps - Forward T with  Counter Support  - 1 x daily - 7 x weekly - 2 sets - 5 reps - 1 hold  ASSESSMENT:  CLINICAL IMPRESSION: Patient arrives to session with little to no pain this date. Began session with hip strengthening with increased weight on ankles. Patient requires seated rest breaks between activities. Patient reports burn-like sensation following lunges onto BOSU ball in R glute muscle and an increase of numbness into RLE. Followed with standing lumbar ext. Improves LBP but inc RLE N/T. RLE pain improves with sitting in piriformis stretch position. Ended with gait trial using SPC. PT required verbal and visual cueing for 2 point gait pattern. She demonstrates some difficulty with coordination and balance throughout, requires min assist at times for recovery. Pt with appropriate fatigue at EOS. Patient continues to require skilled physical therapy to address her remaining impairments to return to her prior level of function.       Eval: Patient is a 69 y.o. female who was seen today for physical therapy evaluation and treatment for M41.9 (ICD-10-CM) - Scoliosis, unspecified.  Patient demonstrates muscle weakness, reduced ROM, and fascial restrictions which are likely contributing to symptoms of pain and are negatively impacting patient ability to perform ADLs and functional mobility tasks. Patient will benefit from skilled physical therapy services to address these deficits to reduce pain and improve level of function with ADLs and functional mobility tasks.   OBJECTIVE IMPAIRMENTS: Abnormal gait, decreased activity tolerance, decreased balance, decreased ROM, decreased strength, impaired perceived functional ability, and pain.   ACTIVITY LIMITATIONS: carrying, lifting, bending, sitting, standing, sleeping, transfers, bed mobility, and locomotion level  PARTICIPATION LIMITATIONS: meal prep, cleaning, laundry, shopping, and community activity  REHAB POTENTIAL: Good  CLINICAL DECISION MAKING: Evolving/moderate  complexity  EVALUATION COMPLEXITY: Moderate   GOALS: Goals reviewed with patient? Yes  SHORT TERM GOALS: Target date: 11/05/2023  patient will be independent with initial HEP  Baseline:  11/18/23:  Reports compliance iwht exercises every other day Goal status: MET  2.  Patient will report 30% improvement overall  Baseline:  11/18/23:  Reports improvements by 35% Goal status: MET  LONG TERM GOALS: Target date: 02/07/24  Patient will be independent in self management strategies to improve quality of life and functional outcomes.  Baseline:  Goal status: Ongoing  2.  Patient will report 50% improvement overall  Baseline: 11/18/23: Reports improvements by 35% Goal status: MET  3.  Patient will improve Modified Oswestry score by 8 points (20/50 or less)  to demonstrate improved perceived function  Baseline: 28/50; 11/18/23: 22/50 Goal status: MET  4.  Patient will improve 5 times sit to stand score to 15 sec or less to demonstrate improved functional mobility and increased leg strength.    Baseline: 58.2 sec;  11/18/23: 5STS 36.23 Goal status: MET  5.   Patient will increase right leg MMT's to 4+ to 5/5 to allow navigation of steps without gait deviation or loss of balance  Baseline:  Goal status: Ongoing  6.  Patient will able to stand SLS on each leg x 5 to demonstrate improved functional balance  Baseline: SLS R: 2.4 seconds L: 5.01 seconds Goal status: Partially MET  PLAN:  PT FREQUENCY: 2x/week  PT DURATION: 4 weeks  PLANNED INTERVENTIONS: 97164- PT Re-evaluation, 97110-Therapeutic exercises, 97530- Therapeutic activity, 97112- Neuromuscular re-education, 97535- Self Care, [id number]- Manual therapy, [id number]- Gait training, [phone]- Orthotic Fit/training, [phone]- Canalith repositioning, [id number]- Aquatic Therapy, 97760- Splinting, [id number]- Wound care (first 20 sq cm), 97598- Wound care (each additional 20 sq cm)Patient/Family education, Balance training, Stair training,  Taping, Dry Needling, Joint mobilization, Joint manipulation, Spinal manipulation, Spinal mobilization, Scar mobilization, and DME instructions. SABRA  PLAN FOR NEXT SESSION: decompression exercises; postural strengthening; balance; unable to tolerate long periods of standing.  Work on floor to standing and balance.  Progress functional strengthening as able. Plan to extend cert to end of January for continued progress towards therapy goals.   12:03 PM, 01/26/24 Rosaria Settler, PT, DPT [facility] [place]  "

## 2024-01-29 ENCOUNTER — Encounter (HOSPITAL_COMMUNITY): Payer: Self-pay

## 2024-01-29 ENCOUNTER — Ambulatory Visit (HOSPITAL_COMMUNITY)

## 2024-01-29 DIAGNOSIS — M5459 Other low back pain: Secondary | ICD-10-CM

## 2024-01-29 DIAGNOSIS — R293 Abnormal posture: Secondary | ICD-10-CM

## 2024-01-29 DIAGNOSIS — R262 Difficulty in walking, not elsewhere classified: Secondary | ICD-10-CM

## 2024-01-29 NOTE — Therapy (Signed)
 " OUTPATIENT PHYSICAL THERAPY THORACOLUMBAR TREATMENT  Patient Name: Erica Gallegos MRN: [id number] DOB:1955/04/13, 69 y.o., female Today's Date: 01/29/2024  END OF SESSION:  PT End of Session - 01/29/24 1124     Visit Number 13    Number of Visits 15    Date for Recertification  02/07/24    Authorization Type BCBS Medicare    Authorization Time Period no auth needed    Progress Note Due on Visit 19    PT Start Time 1120    PT Stop Time 1202    PT Time Calculation (min) 42 min    Activity Tolerance Patient tolerated treatment well;No increased pain    Behavior During Therapy WFL for tasks assessed/performed              Past Medical History:  Diagnosis Date   Allergy    Anxiety    Arthritis    Cataract    Removed/both eyes   Chronic kidney disease    Small cyst   Complication of anesthesia    Patient woke up during anesthesia with bunionectomy   GERD (gastroesophageal reflux disease)    HLD (hyperlipidemia)    HTN (hypertension)    Major depressive disorder    Migraine with aura    Neuromuscular disorder (HCC) 2018/approximately   Spinal stenosis   PONV (postoperative nausea and vomiting)    RAS (renal artery stenosis)    Scoliosis    Sleep apnea    Sleep related hypoxia    Spinal stenosis    Past Surgical History:  Procedure Laterality Date   BUNIONECTOMY     CATARACT EXTRACTION     CHOLECYSTECTOMY     COLONOSCOPY WITH PROPOFOL  N/A 12/13/2021   Procedure: COLONOSCOPY WITH PROPOFOL ;  Surgeon: Shaaron Lamar CHRISTELLA, MD;  [facility];  Service: Endoscopy;  Laterality: N/A;  8:00am, asa 2   ESOPHAGOGASTRODUODENOSCOPY (EGD) WITH PROPOFOL  N/A 04/05/2021   Procedure: ESOPHAGOGASTRODUODENOSCOPY (EGD) WITH PROPOFOL ;  Surgeon: Shaaron Lamar CHRISTELLA, MD;  [facility];  Service: Endoscopy;  Laterality: N/A;  10:15am   ESOPHAGOGASTRODUODENOSCOPY (EGD) WITH PROPOFOL  N/A 12/13/2021   Procedure: ESOPHAGOGASTRODUODENOSCOPY (EGD) WITH PROPOFOL ;  Surgeon:  Shaaron Lamar CHRISTELLA, MD;  [facility];  Service: Endoscopy;  Laterality: N/A;   EYE SURGERY     MALONEY DILATION N/A 04/05/2021   Procedure: MALONEY DILATION;  Surgeon: Shaaron Lamar CHRISTELLA, MD;  [facility]: [facility];  Service: Endoscopy;  Laterality: N/A;   MALONEY DILATION N/A 12/13/2021   Procedure: AGAPITO DILATION;  Surgeon: Shaaron Lamar CHRISTELLA, MD;  [facility]: [facility];  Service: Endoscopy;  Laterality: N/A;   TUBAL LIGATION  1987   Patient Active Problem List   Diagnosis Date Noted   Hypothyroidism 11/12/2023   Solitary pulmonary nodule on lung CT 09/22/2023   Stage 3b chronic kidney disease (HCC) 05/15/2023   Itching 12/26/2022   PTSD (post-traumatic stress disorder) 11/12/2022   Abnormal thyroid  blood test 06/27/2022   Hypercalcemia 06/27/2022   Need for pneumococcal 20-valent conjugate vaccination 06/18/2022   Anxiety and depression 02/26/2022   OSA on CPAP 02/26/2022   Ocular pain, right eye 02/26/2022   Need for influenza vaccination 10/23/2021   Need for shingles vaccine 10/23/2021   Memory problem 08/21/2021   Osteoporosis 07/24/2021   Vitamin D  deficiency 07/24/2021   Neuropathy 03/22/2021   Hyperlipidemia 03/22/2021   Spinal stenosis 03/22/2021   Fall 03/22/2021   Right shoulder pain 03/22/2021   Left knee pain 03/22/2021   Encounter  for screening fecal occult blood testing 08/28/2020   Encounter for gynecological examination with Papanicolaou smear of cervix 08/28/2020   Dysphagia 03/02/2020   Hypertension 03/02/2020   Nausea without vomiting 03/02/2020   Gastroesophageal reflux disease 03/02/2020    PCP: Melvenia Mungo, MD  REFERRING PROVIDER: Darnella Dorn SAUNDERS, MD  REFERRING DIAG: M41.9 (ICD-10-CM) - Scoliosis, unspecified  Rationale for Evaluation and Treatment: Rehabilitation  THERAPY DIAG:  Other low back pain  Difficulty in walking, not elsewhere classified  Abnormal posture  ONSET DATE: back pain for years  SUBJECTIVE:                                                                                                                                                                                            SUBJECTIVE STATEMENT: Pt reports increased discomfort in R knee, both shoulders, and low back today. Reports at least 3/10 in intensity.    Eval: Arrives with rollater; I need balance too; referred for scoliosis and also has spinal stenosis; takes reclast  for osteoporosis.  My goal is to have surgery done.  Originally injured her back in the 57's.  Worst pain in the last 3 years; was originally seeing Dr. Carollee.  Now seeing Dr. Darnella who referred for therapy.  If she has surgery will be [facility] or [facility]; has had to start using a rollator over the past year.  MD told me it (therapy) would not help  PERTINENT HISTORY:  Anxiety, HTN, migraine, osteoporosis, PTSD; spinal stenosis  PAIN:  Are you having pain? Yes: NPRS scale: 3/10; 3/10 at best; 10/10 at worst Pain location: low back and but now up to right shoulder and arm, right leg can give out Pain description: aching sore constant but can worsen with activity Aggravating factors: standing and walking, turning over in bed Relieving factors: sit, rest  PRECAUTIONS: Fall  RED FLAGS: None   WEIGHT BEARING RESTRICTIONS: No  FALLS:  Has patient fallen in last 6 months? Yes. Number of falls 1  LIVING ENVIRONMENT: Lives with: lives with their spouse Lives in: House/apartment Stairs: No ramp entry Has following equipment at home: Vannie - 4 wheeled  OCCUPATION: retired  PLOF: Independent with household mobility with device and Independent with community mobility with device  PATIENT GOALS: to have surgery done  NEXT MD VISIT: PRN  OBJECTIVE:  Note: Objective measures were completed at Evaluation unless otherwise noted.  DIAGNOSTIC FINDINGS:  DEGENERATIVE CHANGES: Moderate disc height loss at C5-6 and C6-7. Small central disc-osteophyte complex  at C6-7. Unchanged severe right neural foraminal narrowing at C6-7. Unchanged moderate right and severe left neural foraminal narrowing at  C5-6 due to facet arthropathy and uncovertebral joint spurring.I  IMPRESSION: 1. Severe spinal canal stenosis at L1-2, L2-3, and L4-5 levels. 2. Severe right neural foraminal narrowing at L2-3 and severe bilateral neural foraminal narrowing at L4-5. 3. Levoscoliotic curvature of the lumbar spine with 7 mm left lateral listhesis of L3 on L4 and 10 mm right lateral listhesis of L1 on L2. 4. No significant spinal canal stenosis in the cervical or thoracic spine. 5. Moderate right and severe left neural foraminal narrowing at C5-6. 6. Severe right neural foraminal narrowing at C6-7. 7. 10 mm solid lung nodule in the left lower lobe. Per Fleischner society recommendations, consider CT, PET/CT, or tissue sampling at 3 months.  PATIENT SURVEYS:  Modified Oswestry:  MODIFIED OSWESTRY DISABILITY SCALE   Date: 10/22/23 Score Date: 11/18/23  Pain intensity  3 =  Pain medication provides me with moderate relief from pain.  2. Personal care (washing, dressing, etc.)  1 =  I can take care of myself normally, but it increases my pain.  3. Lifting  3 = Pain prevents me from lifting heavy weights, but I can manage light to medium weights if they are conveniently positioned  4. Walking  3 =  Pain prevents me from walking more than  mile.  5. Sitting  2 =  Pain prevents me from sitting more than 1 hour.  6. Standing  3 =  Pain prevents me from standing more than 1/2 hour.  7. Sleeping  1 = I can sleep well only by using pain medication.  8. Social Life  2 = Pain prevents me from participating in more energetic activities (eg. sports, dancing).  9. Traveling  1 =  I can travel anywhere, but it increases my pain.  10. Employment/ Homemaking  3 = Pain prevents me from doing anything but light duties.  Total 28/50; 56% 22/50= 44%   Interpretation of scores: Score  Category Description  0-20% Minimal Disability The patient can cope with most living activities. Usually no treatment is indicated apart from advice on lifting, sitting and exercise  21-40% Moderate Disability The patient experiences more pain and difficulty with sitting, lifting and standing. Travel and social life are more difficult and they may be disabled from work. Personal care, sexual activity and sleeping are not grossly affected, and the patient can usually be managed by conservative means  41-60% Severe Disability Pain remains the main problem in this group, but activities of daily living are affected. These patients require a detailed investigation  61-80% Crippled Back pain impinges on all aspects of the patients life. Positive intervention is required  81-100% Bed-bound These patients are either bed-bound or exaggerating their symptoms  Bluford FORBES Zoe DELENA Karon DELENA, et al. Surgery versus conservative management of stable thoracolumbar fracture: the PRESTO feasibility RCT. [place] ([place]): [employer]; 2021 Nov. [facility] Technology Assessment, No. 25.62.) Appendix 3, Oswestry Disability Index category descriptors. Available from: Findjewelers.cz  Minimally Clinically Important Difference (MCID) = 12.8%  COGNITION: Overall cognitive status: Within functional limits for tasks assessed     SENSATION: Both fingers go numb sometimes  MUSCLE LENGTH: Hamstrings: test next visit POSTURE: forward head, increased thoracic kyphosis, and flexed trunk   PALPATION: Right shoulder elevated; right rib hump; tenderness mid back and low back noted  LUMBAR ROM:  AROM eval  Flexion 65, tightness in hamstinrgs, BUE support on walker  Extension 15  Right lateral flexion 6  Left lateral flexion 11  Right rotation Unable, legs giving  out  Left rotation Unable, legs giving out   (Blank rows = not tested)  LOWER EXTREMITY ROM:     Active  Right eval  Left eval  Hip flexion    Hip extension    Hip abduction    Hip adduction    Hip internal rotation    Hip external rotation    Knee flexion    Knee extension    Ankle dorsiflexion    Ankle plantarflexion    Ankle inversion    Ankle eversion     (Blank rows = not tested)  LOWER EXTREMITY MMT:    MMT Right eval Left eval Right 11/18/23` Left 11/18/23 Right 12/26/23 Left 12/26/23  Hip flexion 3+ 4 4- 4+ 4- 4+  Hip extension   3/5 3+/5 4- 4+  Hip abduction     4- 4  Hip adduction     4- 4  Hip internal rotation        Hip external rotation        Knee flexion     4- 5  Knee extension 3+ 4+ 4/5 4+ 4- 5  Ankle dorsiflexion 4- 4+ 4/5 4+ 4- 5  Ankle plantarflexion        Ankle inversion        Ankle eversion         (Blank rows = not tested)   FUNCTIONAL TESTS:  5 times sit to stand: 58.21 sec SLS unable  11/18/23:  216 with rollator, no increased pain 5STS: 36.23 SLS Rt 1, Lt 3    12/26/23:   5TSTS: 20.99 seconds, no increased pain in back   SLS: R: 2.4 seconds L: 5.01 seconds   : 328 feet, with rolator, back feels good but RLE pain present GAIT: Distance walked: 50 ft in clinic Assistive device utilized: Walker - 4 wheeled Level of assistance: Modified independence Comments: flexed trunk; slow gait speed  TREATMENT DATE:  01/29/24: NuStep, level 1, seat 9, 7', SPM over 60  Heel raises, 2x12 Toe raises, 2x12, RLE discomfort N/T Seated piriformis stretch position, 3x30 hold to relieve pain LAQ, 4 lb. AW, 2x12 each LE Ambulation trial with SPC, 2x100 ft, CGA mostly to assist throughout for balance and LE weakness, one instance of LOB STS, holding tidal tank, 15x      01/26/24: Hip vectors, 4 lb AW on each LE, 2x10 Step ups, 4 lb. AW on each LE, 1'--> 1'15 BOSU mini squats, 2x10, CGA for safety BOSU mini lunges, 2x10 Standing lumbar ext over // bars, 5 holds, 10x Seated piriformis stretch position, 2' hold Ambulation trial with SPC, 83  ft, min assist throughout for balance and LE weakness    01/19/24: Recumbent bike, seat 11, level 3, 4 min  Resisted rows, GTB,  standing on foam, 15x  Added march, 10x Resisted ext +march, GTB, standing on foam, 8x STS, feet on foam, no UE use, x10 Seated Russian twists with Kettle bell, 5 lb, 10x2 Core set with physio ball, 10 holds 2x, pressing BUE down into ball     PATIENT EDUCATION:  Education details: Patient educated on exam findings, POC, scope of PT, HEP, and what to expect next visit. Person educated: Patient Education method: Explanation, Demonstration, and Handouts Education comprehension: verbalized understanding, returned demonstration, verbal cues required, and tactile cues required  HOME EXERCISE PROGRAM: Access Code: [email or web address] URL: [email or web address] Date: 12/26/2023 Prepared by: Lang Ada  Exercises - Sitting to Supine Roll  - 1 x daily - 7 x  weekly - 1 sets - 1 reps - Supine Bridge  - 1 x daily - 7 x weekly - 3 sets - 10 reps - 5 hold - Sidelying Hip Abduction  - 1 x daily - 7 x weekly - 3 sets - 10 reps - Sit to Stand  - 2 x daily - 7 x weekly - 3 sets - 10 reps - Seated Figure 4 Piriformis Stretch  - 2 x daily - 7 x weekly - 1 sets - 3 reps - 30 hold - Single Leg Stance with Support  - 1 x daily - 7 x weekly - 1 sets - 3 reps - 30 hold - Tandem Stance with Support  - 1 x daily - 7 x weekly - 1 sets - 3 reps - 30 hold - Side Stepping with Resistance at Thighs  - 1 x daily - 7 x weekly - 3 sets - 10 reps - Bird Dog  - 1 x daily - 7 x weekly - 3 sets - 10 reps - Forward T with Counter Support  - 1 x daily - 7 x weekly - 2 sets - 5 reps - 1 hold  ASSESSMENT:  CLINICAL IMPRESSION: Patient arrives to session in increased pain throughout body this date. Began session on Nustep for general warm up of tissues with pt able to maintain steady pace. Followed with gentle general LE strengthening exercises to limit pain. Pt reports increase N/T  down RLE with toe raises. Improved with figure four position. Ended session with gait trial with SPC. Pt demonstrate improved gait pattern, requiring little verbal cues this date for 2 point gait pattern. Less instances of loss of balance of note. Pt reports feeling better at EOS. Patient continues to require skilled physical therapy to address her remaining impairments to return to her prior level of function.      Eval: Patient is a 70 y.o. female who was seen today for physical therapy evaluation and treatment for M41.9 (ICD-10-CM) - Scoliosis, unspecified.  Patient demonstrates muscle weakness, reduced ROM, and fascial restrictions which are likely contributing to symptoms of pain and are negatively impacting patient ability to perform ADLs and functional mobility tasks. Patient will benefit from skilled physical therapy services to address these deficits to reduce pain and improve level of function with ADLs and functional mobility tasks.   OBJECTIVE IMPAIRMENTS: Abnormal gait, decreased activity tolerance, decreased balance, decreased ROM, decreased strength, impaired perceived functional ability, and pain.   ACTIVITY LIMITATIONS: carrying, lifting, bending, sitting, standing, sleeping, transfers, bed mobility, and locomotion level  PARTICIPATION LIMITATIONS: meal prep, cleaning, laundry, shopping, and community activity  REHAB POTENTIAL: Good  CLINICAL DECISION MAKING: Evolving/moderate complexity  EVALUATION COMPLEXITY: Moderate   GOALS: Goals reviewed with patient? Yes  SHORT TERM GOALS: Target date: 11/05/2023  patient will be independent with initial HEP  Baseline:  11/18/23:  Reports compliance iwht exercises every other day Goal status: MET  2.  Patient will report 30% improvement overall  Baseline:  11/18/23:  Reports improvements by 35% Goal status: MET  LONG TERM GOALS: Target date: 02/07/24  Patient will be independent in self management strategies to improve  quality of life and functional outcomes.  Baseline:  Goal status: Ongoing  2.  Patient will report 50% improvement overall  Baseline: 11/18/23: Reports improvements by 35% Goal status: MET  3.  Patient will improve Modified Oswestry score by 8 points (20/50 or less)  to demonstrate improved perceived function  Baseline: 28/50; 11/18/23: 22/50 Goal status:  MET  4.  Patient will improve 5 times sit to stand score to 15 sec or less to demonstrate improved functional mobility and increased leg strength.    Baseline: 58.2 sec;  11/18/23: 5STS 36.23 Goal status: MET  5.   Patient will increase right leg MMT's to 4+ to 5/5 to allow navigation of steps without gait deviation or loss of balance  Baseline:  Goal status: Ongoing  6.  Patient will able to stand SLS on each leg x 5 to demonstrate improved functional balance  Baseline: SLS R: 2.4 seconds L: 5.01 seconds Goal status: Partially MET  PLAN:  PT FREQUENCY: 2x/week  PT DURATION: 4 weeks  PLANNED INTERVENTIONS: [id number]- PT Re-evaluation, 97110-Therapeutic exercises, 97530- Therapeutic activity, 97112- Neuromuscular re-education, 97535- Self Care, [id number]- Manual therapy, [id number]- Gait training, [phone]- Orthotic Fit/training, [phone]- Canalith repositioning, [id number]- Aquatic Therapy, 97760- Splinting, [id number]- Wound care (first 20 sq cm), 97598- Wound care (each additional 20 sq cm)Patient/Family education, Balance training, Stair training, Taping, Dry Needling, Joint mobilization, Joint manipulation, Spinal manipulation, Spinal mobilization, Scar mobilization, and DME instructions. SABRA  PLAN FOR NEXT SESSION: decompression exercises; postural strengthening; balance; unable to tolerate long periods of standing.  Work on floor to standing and balance.  Progress functional strengthening as able. Plan to extend cert to end of January for continued progress towards therapy goals.   12:13 PM, 01/29/24 Rosaria Settler, PT, DPT [facility] [place]  "

## 2024-02-02 ENCOUNTER — Ambulatory Visit (HOSPITAL_COMMUNITY)

## 2024-02-05 ENCOUNTER — Ambulatory Visit (HOSPITAL_COMMUNITY)

## 2024-02-05 ENCOUNTER — Encounter (HOSPITAL_COMMUNITY): Payer: Self-pay

## 2024-02-05 DIAGNOSIS — M5459 Other low back pain: Secondary | ICD-10-CM

## 2024-02-05 DIAGNOSIS — R293 Abnormal posture: Secondary | ICD-10-CM

## 2024-02-05 DIAGNOSIS — R262 Difficulty in walking, not elsewhere classified: Secondary | ICD-10-CM

## 2024-02-05 DIAGNOSIS — R2689 Other abnormalities of gait and mobility: Secondary | ICD-10-CM

## 2024-02-05 NOTE — Therapy (Signed)
 " OUTPATIENT PHYSICAL THERAPY THORACOLUMBAR TREATMENT  Patient Name: Erica Gallegos MRN: [id number] DOB:1955-05-23, 69 y.o., female Today's Date: 02/05/2024  END OF SESSION:  PT End of Session - 02/05/24 1113     Visit Number 14    Number of Visits 15    Date for Recertification  02/07/24    Authorization Type BCBS Medicare    Authorization Time Period no auth needed    Progress Note Due on Visit 19    PT Start Time 1115    PT Stop Time 1146   Patient had to leave early for another appointment.   PT Time Calculation (min) 31 min    Activity Tolerance Patient tolerated treatment well    Behavior During Therapy WFL for tasks assessed/performed               Past Medical History:  Diagnosis Date   Allergy    Anxiety    Arthritis    Cataract    Removed/both eyes   Chronic kidney disease    Small cyst   Complication of anesthesia    Patient woke up during anesthesia with bunionectomy   GERD (gastroesophageal reflux disease)    HLD (hyperlipidemia)    HTN (hypertension)    Major depressive disorder    Migraine with aura    Neuromuscular disorder (HCC) 2018/approximately   Spinal stenosis   PONV (postoperative nausea and vomiting)    RAS (renal artery stenosis)    Scoliosis    Sleep apnea    Sleep related hypoxia    Spinal stenosis    Past Surgical History:  Procedure Laterality Date   BUNIONECTOMY     CATARACT EXTRACTION     CHOLECYSTECTOMY     COLONOSCOPY WITH PROPOFOL  N/A 12/13/2021   Procedure: COLONOSCOPY WITH PROPOFOL ;  Surgeon: Shaaron Lamar CHRISTELLA, MD;  [facility];  Service: Endoscopy;  Laterality: N/A;  8:00am, asa 2   ESOPHAGOGASTRODUODENOSCOPY (EGD) WITH PROPOFOL  N/A 04/05/2021   Procedure: ESOPHAGOGASTRODUODENOSCOPY (EGD) WITH PROPOFOL ;  Surgeon: Shaaron Lamar CHRISTELLA, MD;  [facility];  Service: Endoscopy;  Laterality: N/A;  10:15am   ESOPHAGOGASTRODUODENOSCOPY (EGD) WITH PROPOFOL  N/A 12/13/2021   Procedure:  ESOPHAGOGASTRODUODENOSCOPY (EGD) WITH PROPOFOL ;  Surgeon: Shaaron Lamar CHRISTELLA, MD;  [facility];  Service: Endoscopy;  Laterality: N/A;   EYE SURGERY     MALONEY DILATION N/A 04/05/2021   Procedure: MALONEY DILATION;  Surgeon: Shaaron Lamar CHRISTELLA, MD;  [facility]: [facility];  Service: Endoscopy;  Laterality: N/A;   MALONEY DILATION N/A 12/13/2021   Procedure: AGAPITO DILATION;  Surgeon: Shaaron Lamar CHRISTELLA, MD;  [facility]: [facility];  Service: Endoscopy;  Laterality: N/A;   TUBAL LIGATION  1987   Patient Active Problem List   Diagnosis Date Noted   Hypothyroidism 11/12/2023   Solitary pulmonary nodule on lung CT 09/22/2023   Stage 3b chronic kidney disease (HCC) 05/15/2023   Itching 12/26/2022   PTSD (post-traumatic stress disorder) 11/12/2022   Abnormal thyroid  blood test 06/27/2022   Hypercalcemia 06/27/2022   Need for pneumococcal 20-valent conjugate vaccination 06/18/2022   Anxiety and depression 02/26/2022   OSA on CPAP 02/26/2022   Ocular pain, right eye 02/26/2022   Need for influenza vaccination 10/23/2021   Need for shingles vaccine 10/23/2021   Memory problem 08/21/2021   Osteoporosis 07/24/2021   Vitamin D  deficiency 07/24/2021   Neuropathy 03/22/2021   Hyperlipidemia 03/22/2021   Spinal stenosis 03/22/2021   Fall 03/22/2021   Right shoulder pain 03/22/2021  Left knee pain 03/22/2021   Encounter for screening fecal occult blood testing 08/28/2020   Encounter for gynecological examination with Papanicolaou smear of cervix 08/28/2020   Dysphagia 03/02/2020   Hypertension 03/02/2020   Nausea without vomiting 03/02/2020   Gastroesophageal reflux disease 03/02/2020    PCP: Melvenia Mungo, MD  REFERRING PROVIDER: Darnella Dorn SAUNDERS, MD  REFERRING DIAG: M41.9 (ICD-10-CM) - Scoliosis, unspecified  Rationale for Evaluation and Treatment: Rehabilitation  THERAPY DIAG:  Other low back pain  Difficulty in walking, not elsewhere classified  Abnormal  posture  Other abnormalities of gait and mobility  ONSET DATE: back pain for years  SUBJECTIVE:                                                                                                                                                                                           SUBJECTIVE STATEMENT: Patient reports that she feels like she is walking better since starting physical therapy. She would like to spend more time walking with a cane today.    Eval: Arrives with rollater; I need balance too; referred for scoliosis and also has spinal stenosis; takes reclast  for osteoporosis.  My goal is to have surgery done.  Originally injured her back in the 60's.  Worst pain in the last 3 years; was originally seeing Dr. Carollee.  Now seeing Dr. Darnella who referred for therapy.  If she has surgery will be at [facility] or [facility]; has had to start using a rollator over the past year.  MD told me it (therapy) would not help  PERTINENT HISTORY:  Anxiety, HTN, migraine, osteoporosis, PTSD; spinal stenosis  PAIN:  Are you having pain? Yes: NPRS scale: 0/10; 0/10 at best; 10/10 at worst Pain location: low back and but now up to right shoulder and arm, right leg can give out Pain description: aching sore constant but can worsen with activity Aggravating factors: standing and walking, turning over in bed Relieving factors: sit, rest  PRECAUTIONS: Fall  RED FLAGS: None   WEIGHT BEARING RESTRICTIONS: No  FALLS:  Has patient fallen in last 6 months? Yes. Number of falls 1  LIVING ENVIRONMENT: Lives with: lives with their spouse Lives in: House/apartment Stairs: No ramp entry Has following equipment at home: Vannie - 4 wheeled  OCCUPATION: retired  PLOF: Independent with household mobility with device and Independent with community mobility with device  PATIENT GOALS: to have surgery done  NEXT MD VISIT: PRN  OBJECTIVE:  Note: Objective measures were completed at Evaluation  unless otherwise noted.  DIAGNOSTIC FINDINGS:  DEGENERATIVE CHANGES: Moderate disc height loss at C5-6 and C6-7. Small central disc-osteophyte  complex at C6-7. Unchanged severe right neural foraminal narrowing at C6-7. Unchanged moderate right and severe left neural foraminal narrowing at C5-6 due to facet arthropathy and uncovertebral joint spurring.I  IMPRESSION: 1. Severe spinal canal stenosis at L1-2, L2-3, and L4-5 levels. 2. Severe right neural foraminal narrowing at L2-3 and severe bilateral neural foraminal narrowing at L4-5. 3. Levoscoliotic curvature of the lumbar spine with 7 mm left lateral listhesis of L3 on L4 and 10 mm right lateral listhesis of L1 on L2. 4. No significant spinal canal stenosis in the cervical or thoracic spine. 5. Moderate right and severe left neural foraminal narrowing at C5-6. 6. Severe right neural foraminal narrowing at C6-7. 7. 10 mm solid lung nodule in the left lower lobe. Per Fleischner society recommendations, consider CT, PET/CT, or tissue sampling at 3 months.  PATIENT SURVEYS:  Modified Oswestry:  MODIFIED OSWESTRY DISABILITY SCALE   Date: 10/22/23 Score Date: 11/18/23  Pain intensity  3 =  Pain medication provides me with moderate relief from pain.  2. Personal care (washing, dressing, etc.)  1 =  I can take care of myself normally, but it increases my pain.  3. Lifting  3 = Pain prevents me from lifting heavy weights, but I can manage light to medium weights if they are conveniently positioned  4. Walking  3 =  Pain prevents me from walking more than  mile.  5. Sitting  2 =  Pain prevents me from sitting more than 1 hour.  6. Standing  3 =  Pain prevents me from standing more than 1/2 hour.  7. Sleeping  1 = I can sleep well only by using pain medication.  8. Social Life  2 = Pain prevents me from participating in more energetic activities (eg. sports, dancing).  9. Traveling  1 =  I can travel anywhere, but it increases my pain.  10.  Employment/ Homemaking  3 = Pain prevents me from doing anything but light duties.  Total 28/50; 56% 22/50= 44%   Interpretation of scores: Score Category Description  0-20% Minimal Disability The patient can cope with most living activities. Usually no treatment is indicated apart from advice on lifting, sitting and exercise  21-40% Moderate Disability The patient experiences more pain and difficulty with sitting, lifting and standing. Travel and social life are more difficult and they may be disabled from work. Personal care, sexual activity and sleeping are not grossly affected, and the patient can usually be managed by conservative means  41-60% Severe Disability Pain remains the main problem in this group, but activities of daily living are affected. These patients require a detailed investigation  61-80% Crippled Back pain impinges on all aspects of the patients life. Positive intervention is required  81-100% Bed-bound These patients are either bed-bound or exaggerating their symptoms  Bluford FORBES Zoe DELENA Karon DELENA, et al. Surgery versus conservative management of stable thoracolumbar fracture: the PRESTO feasibility RCT. [place] ([place]): [employer]; 2021 Nov. [facility] Technology Assessment, No. 25.62.) Appendix 3, Oswestry Disability Index category descriptors. Available from: Findjewelers.cz  Minimally Clinically Important Difference (MCID) = 12.8%  COGNITION: Overall cognitive status: Within functional limits for tasks assessed     SENSATION: Both fingers go numb sometimes  MUSCLE LENGTH: Hamstrings: test next visit POSTURE: forward head, increased thoracic kyphosis, and flexed trunk   PALPATION: Right shoulder elevated; right rib hump; tenderness mid back and low back noted  LUMBAR ROM:  AROM eval  Flexion 65, tightness in hamstinrgs, BUE support  on walker  Extension 15  Right lateral flexion 6  Left lateral flexion 11  Right  rotation Unable, legs giving out  Left rotation Unable, legs giving out   (Blank rows = not tested)  LOWER EXTREMITY ROM:     Active  Right eval Left eval  Hip flexion    Hip extension    Hip abduction    Hip adduction    Hip internal rotation    Hip external rotation    Knee flexion    Knee extension    Ankle dorsiflexion    Ankle plantarflexion    Ankle inversion    Ankle eversion     (Blank rows = not tested)  LOWER EXTREMITY MMT:    MMT Right eval Left eval Right 11/18/23` Left 11/18/23 Right 12/26/23 Left 12/26/23  Hip flexion 3+ 4 4- 4+ 4- 4+  Hip extension   3/5 3+/5 4- 4+  Hip abduction     4- 4  Hip adduction     4- 4  Hip internal rotation        Hip external rotation        Knee flexion     4- 5  Knee extension 3+ 4+ 4/5 4+ 4- 5  Ankle dorsiflexion 4- 4+ 4/5 4+ 4- 5  Ankle plantarflexion        Ankle inversion        Ankle eversion         (Blank rows = not tested)   FUNCTIONAL TESTS:  5 times sit to stand: 58.21 sec SLS unable  11/18/23:  216 with rollator, no increased pain 5STS: 36.23 SLS Rt 1, Lt 3    12/26/23:   5TSTS: 20.99 seconds, no increased pain in back   SLS: R: 2.4 seconds L: 5.01 seconds   : 328 feet, with rolator, back feels good but RLE pain present GAIT: Distance walked: 50 ft in clinic Assistive device utilized: Walker - 4 wheeled Level of assistance: Modified independence Comments: flexed trunk; slow gait speed  TREATMENT DATE:                                    02/05/24 EXERCISE LOG  Exercise Repetitions and Resistance Comments  Nustep  L2 x 6 minutes    Gait training with SPC   202 feet    Chair taps   8 reps    Tandem walking   10 feet         Blank cell = exercise not performed today   01/29/24: NuStep, level 1, seat 9, 7', SPM over 60  Heel raises, 2x12 Toe raises, 2x12, RLE discomfort N/T Seated piriformis stretch position, 3x30 hold to relieve pain LAQ, 4 lb. AW, 2x12 each LE Ambulation  trial with SPC, 2x100 ft, CGA mostly to assist throughout for balance and LE weakness, one instance of LOB STS, holding tidal tank, 15x      01/26/24: Hip vectors, 4 lb AW on each LE, 2x10 Step ups, 4 lb. AW on each LE, 1'--> 1'15 BOSU mini squats, 2x10, CGA for safety BOSU mini lunges, 2x10 Standing lumbar ext over // bars, 5 holds, 10x Seated piriformis stretch position, 2' hold Ambulation trial with SPC, 83 ft, min assist throughout for balance and LE weakness   PATIENT EDUCATION:  Education details: Patient educated on proper cane utilization, POC, and progress with physical therapy  Person educated: Patient  Education method: Medical Illustrator Education comprehension: verbalized understanding and returned demonstration  HOME EXERCISE PROGRAM: Access Code: [email or web address] URL: [email or web address] Date: 12/26/2023 Prepared by: Lang Ada  Exercises - Sitting to Supine Roll  - 1 x daily - 7 x weekly - 1 sets - 1 reps - Supine Bridge  - 1 x daily - 7 x weekly - 3 sets - 10 reps - 5 hold - Sidelying Hip Abduction  - 1 x daily - 7 x weekly - 3 sets - 10 reps - Sit to Stand  - 2 x daily - 7 x weekly - 3 sets - 10 reps - Seated Figure 4 Piriformis Stretch  - 2 x daily - 7 x weekly - 1 sets - 3 reps - 30 hold - Single Leg Stance with Support  - 1 x daily - 7 x weekly - 1 sets - 3 reps - 30 hold - Tandem Stance with Support  - 1 x daily - 7 x weekly - 1 sets - 3 reps - 30 hold - Side Stepping with Resistance at Thighs  - 1 x daily - 7 x weekly - 3 sets - 10 reps - Bird Dog  - 1 x daily - 7 x weekly - 3 sets - 10 reps - Forward T with Counter Support  - 1 x daily - 7 x weekly - 2 sets - 5 reps - 1 hold  ASSESSMENT:  CLINICAL IMPRESSION: Patient was unable to complete her full scheduled appointment due to having to leave early for another appointment. Today's treatment focused on gait training with the [facility] for improved sequencing and stability needed to  improve her functional mobility. She fatigued quickly as she required a standing rest break after walking 150 feet and 175 feet. Her HEP were reviewed and sit to stands were modified to chair taps to facilitate increased lower extremity demand. She reported feeling tired upon the conclusion of treatment. Patient continues to require skilled physical therapy to address their remaining impairments to return to her prior level of function.    Eval: Patient is a 69 y.o. female who was seen today for physical therapy evaluation and treatment for M41.9 (ICD-10-CM) - Scoliosis, unspecified.  Patient demonstrates muscle weakness, reduced ROM, and fascial restrictions which are likely contributing to symptoms of pain and are negatively impacting patient ability to perform ADLs and functional mobility tasks. Patient will benefit from skilled physical therapy services to address these deficits to reduce pain and improve level of function with ADLs and functional mobility tasks.   OBJECTIVE IMPAIRMENTS: Abnormal gait, decreased activity tolerance, decreased balance, decreased ROM, decreased strength, impaired perceived functional ability, and pain.   ACTIVITY LIMITATIONS: carrying, lifting, bending, sitting, standing, sleeping, transfers, bed mobility, and locomotion level  PARTICIPATION LIMITATIONS: meal prep, cleaning, laundry, shopping, and community activity  REHAB POTENTIAL: Good  CLINICAL DECISION MAKING: Evolving/moderate complexity  EVALUATION COMPLEXITY: Moderate   GOALS: Goals reviewed with patient? Yes  SHORT TERM GOALS: Target date: 11/05/2023  patient will be independent with initial HEP  Baseline:  11/18/23:  Reports compliance iwht exercises every other day Goal status: MET  2.  Patient will report 30% improvement overall  Baseline:  11/18/23:  Reports improvements by 35% Goal status: MET  LONG TERM GOALS: Target date: 02/07/24  Patient will be independent in self management  strategies to improve quality of life and functional outcomes.  Baseline:  Goal status: Ongoing  2.  Patient will report 50% improvement overall  Baseline: 11/18/23: Reports  improvements by 35% Goal status: MET  3.  Patient will improve Modified Oswestry score by 8 points (20/50 or less)  to demonstrate improved perceived function  Baseline: 28/50; 11/18/23: 22/50 Goal status: MET  4.  Patient will improve 5 times sit to stand score to 15 sec or less to demonstrate improved functional mobility and increased leg strength.    Baseline: 58.2 sec;  11/18/23: 5STS 36.23 Goal status: MET  5.   Patient will increase right leg MMT's to 4+ to 5/5 to allow navigation of steps without gait deviation or loss of balance  Baseline:  Goal status: Ongoing  6.  Patient will able to stand SLS on each leg x 5 to demonstrate improved functional balance  Baseline: SLS R: 2.4 seconds L: 5.01 seconds Goal status: Partially MET  PLAN:  PT FREQUENCY: 2x/week  PT DURATION: 4 weeks  PLANNED INTERVENTIONS: [id number]- PT Re-evaluation, 97110-Therapeutic exercises, 97530- Therapeutic activity, 97112- Neuromuscular re-education, 97535- Self Care, [id number]- Manual therapy, [id number]- Gait training, [phone]- Orthotic Fit/training, [phone]- Canalith repositioning, [id number]- Aquatic Therapy, 97760- Splinting, [id number]- Wound care (first 20 sq cm), 97598- Wound care (each additional 20 sq cm)Patient/Family education, Balance training, Stair training, Taping, Dry Needling, Joint mobilization, Joint manipulation, Spinal manipulation, Spinal mobilization, Scar mobilization, and DME instructions. SABRA  PLAN FOR NEXT SESSION: decompression exercises; postural strengthening; balance; unable to tolerate long periods of standing.  Work on floor to standing and balance.  Progress functional strengthening as able. Progress note for re certification or discharge   11:59 AM, 02/05/24 Lacinda Fass, PT, DPT  [facility]  [place]  "

## 2024-02-06 LAB — COMPREHENSIVE METABOLIC PANEL WITH GFR
ALT: 29 [IU]/L (ref 0–32)
AST: 22 [IU]/L (ref 0–40)
Albumin: 4.5 g/dL (ref 3.9–4.9)
Alkaline Phosphatase: 67 [IU]/L (ref 49–135)
BUN/Creatinine Ratio: 17 (ref 12–28)
BUN: 25 mg/dL (ref 8–27)
Bilirubin Total: 0.4 mg/dL (ref 0.0–1.2)
CO2: 20 mmol/L (ref 20–29)
Calcium: 9.6 mg/dL (ref 8.7–10.3)
Chloride: 108 mmol/L — ABNORMAL HIGH (ref 96–106)
Creatinine, Ser: 1.51 mg/dL — ABNORMAL HIGH (ref 0.57–1.00)
Globulin, Total: 1.8 g/dL (ref 1.5–4.5)
Glucose: 80 mg/dL (ref 70–99)
Potassium: 5.5 mmol/L — ABNORMAL HIGH (ref 3.5–5.2)
Sodium: 140 mmol/L (ref 134–144)
Total Protein: 6.3 g/dL (ref 6.0–8.5)
eGFR: 38 mL/min/{1.73_m2} — ABNORMAL LOW

## 2024-02-06 LAB — TSH: TSH: 2.35 u[IU]/mL (ref 0.450–4.500)

## 2024-02-06 LAB — T4, FREE: Free T4: 1.14 ng/dL (ref 0.82–1.77)

## 2024-02-06 LAB — PTH, INTACT AND CALCIUM: PTH: 53 pg/mL (ref 15–65)

## 2024-02-06 LAB — MAGNESIUM: Magnesium: 1.5 mg/dL — ABNORMAL LOW (ref 1.6–2.3)

## 2024-02-06 LAB — PTH-RELATED PEPTIDE

## 2024-02-06 LAB — PHOSPHORUS: Phosphorus: 3.2 mg/dL (ref 3.0–4.3)

## 2024-02-11 ENCOUNTER — Ambulatory Visit: Admitting: "Endocrinology

## 2024-02-11 ENCOUNTER — Encounter: Payer: Self-pay | Admitting: "Endocrinology

## 2024-02-11 VITALS — BP 132/76 | HR 68 | Ht 66.0 in | Wt 170.6 lb

## 2024-02-11 DIAGNOSIS — E039 Hypothyroidism, unspecified: Secondary | ICD-10-CM

## 2024-02-11 DIAGNOSIS — M816 Localized osteoporosis [Lequesne]: Secondary | ICD-10-CM | POA: Diagnosis not present

## 2024-02-11 MED ORDER — LEVOTHYROXINE SODIUM 25 MCG PO TABS: 25.0000 ug | ORAL_TABLET | Freq: Every day | ORAL | 1 refills | Status: AC

## 2024-02-11 NOTE — Progress Notes (Signed)
 "                                                    02/11/2024, 11:06 AM  Endocrinology follow-up note   Subjective:    Patient ID: Erica Gallegos, female    DOB: 08-24-1955, PCP Bevely Doffing, FNP   Past Medical History:  Diagnosis Date   Allergy    Anxiety    Arthritis    Cataract    Removed/both eyes   Chronic kidney disease    Small cyst   Complication of anesthesia    Patient woke up during anesthesia with bunionectomy   GERD (gastroesophageal reflux disease)    HLD (hyperlipidemia)    HTN (hypertension)    Major depressive disorder    Migraine with aura    Neuromuscular disorder (HCC) 2018/approximately   Spinal stenosis   PONV (postoperative nausea and vomiting)    RAS (renal artery stenosis)    Scoliosis    Sleep apnea    Sleep related hypoxia    Spinal stenosis    Past Surgical History:  Procedure Laterality Date   BUNIONECTOMY     CATARACT EXTRACTION     CHOLECYSTECTOMY     COLONOSCOPY WITH PROPOFOL  N/A 12/13/2021   Procedure: COLONOSCOPY WITH PROPOFOL ;  Surgeon: Shaaron Lamar CHRISTELLA, MD;  [facility];  Service: Endoscopy;  Laterality: N/A;  8:00am, asa 2   ESOPHAGOGASTRODUODENOSCOPY (EGD) WITH PROPOFOL  N/A 04/05/2021   Procedure: ESOPHAGOGASTRODUODENOSCOPY (EGD) WITH PROPOFOL ;  Surgeon: Shaaron Lamar CHRISTELLA, MD;  [facility]: [facility];  Service: Endoscopy;  Laterality: N/A;  10:15am   ESOPHAGOGASTRODUODENOSCOPY (EGD) WITH PROPOFOL  N/A 12/13/2021   Procedure: ESOPHAGOGASTRODUODENOSCOPY (EGD) WITH PROPOFOL ;  Surgeon: Shaaron Lamar CHRISTELLA, MD;  [facility]: [facility];  Service: Endoscopy;  Laterality: N/A;   EYE SURGERY     MALONEY DILATION N/A 04/05/2021   Procedure: MALONEY DILATION;  Surgeon: Shaaron Lamar CHRISTELLA, MD;  [facility]: [facility];  Service: Endoscopy;  Laterality: N/A;   MALONEY DILATION N/A 12/13/2021   Procedure: AGAPITO DILATION;  Surgeon: Shaaron Lamar CHRISTELLA, MD;  Location: [facility];  Service: Endoscopy;  Laterality: N/A;   TUBAL  LIGATION  1987   Social History   Socioeconomic History   Marital status: Married    Spouse name: Not on file   Number of children: 3   Years of education: Not on file   Highest education level: Not on file  Occupational History   Not on file  Tobacco Use   Smoking status: Former    Types: Cigarettes   Smokeless tobacco: Never   Tobacco comments:    She quit smoking in the early 90's  Vaping Use   Vaping status: Former  Substance and Sexual Activity   Alcohol use: Yes    Comment: seldom   Drug use: Not Currently    Types: Marijuana    Comment: CBD cream; Marijuana a couple times a week. helps with her anxiety and migraines.   Sexual activity: Yes    Birth control/protection: None, Post-menopausal  Other Topics Concern   Not on file  Social History Narrative   Lives with her husband, retired.    Social Drivers of Health   Tobacco Use: Medium Risk (02/11/2024)   Patient History    Smoking Tobacco Use: Former    Smokeless Tobacco Use: Never  Passive Exposure: Not on file  Financial Resource Strain: Low Risk (08/30/2022)   Overall Financial Resource Strain (CARDIA)    Difficulty of Paying Living Expenses: Not hard at all  Food Insecurity: No Food Insecurity (12/09/2023)   Epic    Worried About Programme Researcher, Broadcasting/film/video in the Last Year: Never true    Ran Out of Food in the Last Year: Never true  Transportation Needs: No Transportation Needs (12/09/2023)   Epic    Lack of Transportation (Medical): No    Lack of Transportation (Non-Medical): No  Physical Activity: Patient Declined (12/09/2023)   Exercise Vital Sign    Days of Exercise per Week: Patient declined    Minutes of Exercise per Session: Patient declined  Stress: Stress Concern Present (12/09/2023)   [employer] of Occupational Health - Occupational Stress Questionnaire    Feeling of Stress: To some extent  Social Connections: Moderately Integrated (12/09/2023)   Social Connection and Isolation Panel     Frequency of Communication with Friends and Family: More than three times a week    Frequency of Social Gatherings with Friends and Family: More than three times a week    Attends Religious Services: Never    Database Administrator or Organizations: Yes    Attends Banker Meetings: Never    Marital Status: Married  Depression (PHQ2-9): Medium Risk (01/22/2024)   Depression (PHQ2-9)    PHQ-2 Score: 6  Alcohol Screen: Low Risk (08/30/2022)   Alcohol Screen    Last Alcohol Screening Score (AUDIT): 0  Housing: Low Risk (12/09/2023)   Epic    Unable to Pay for Housing in the Last Year: No    Number of Times Moved in the Last Year: 0    Homeless in the Last Year: No  Utilities: Not At Risk (12/09/2023)   Epic    Threatened with loss of utilities: No  Health Literacy: Adequate Health Literacy (12/09/2023)   B1300 Health Literacy    Frequency of need for help with medical instructions: Never   Family History  Problem Relation Age of Onset   Cancer Mother    Hypertension Mother    Diabetes Mother    Heart disease Mother    Hyperlipidemia Mother    Cancer Father    Stroke Father    Hypertension Father    Heart disease Father    Hypertension Sister    Lupus Sister    Arthritis Sister    Hypertension Sister    Colon polyps Sister    Cancer Brother    Brain cancer Brother    Non-Hodgkin's lymphoma Brother    Anxiety disorder Daughter    Vision loss Sister    Colon cancer Neg Hx    Outpatient Encounter Medications as of 02/11/2024  Medication Sig   acetaminophen  (TYLENOL ) 325 MG tablet Take 650 mg by mouth every 6 (six) hours as needed.   amitriptyline  (ELAVIL ) 10 MG tablet Take 1 tablet (10 mg total) by mouth at bedtime as needed for sleep.   atorvastatin  (LIPITOR) 40 MG tablet Take 1 tablet (40 mg total) by mouth daily.   buPROPion  (WELLBUTRIN  XL) 300 MG 24 hr tablet Take 1 tablet (300 mg total) by mouth every morning.   carvedilol  (COREG ) 12.5 MG tablet TAKE 1  TABLET(12.5 MG) BY MOUTH TWICE DAILY   chlorthalidone  (HYGROTON ) 25 MG tablet TAKE 1 AND 1/2 TABLETS(37.5 MG) BY MOUTH DAILY   Cholecalciferol  (VITAMIN D3) 50 MCG (2000 UT) TABS Take 2,000 Units  by mouth daily.   cyclobenzaprine  (FLEXERIL ) 10 MG tablet TAKE 1 TABLET AT BEDTIME AS NEEDED FOR BACK OR NERVE PAIN   DULoxetine  (CYMBALTA ) 30 MG capsule Take 3 capsules (90 mg total) by mouth daily.   esomeprazole  (NEXIUM ) 20 MG capsule Take 1 capsule (20 mg total) by mouth daily at 12 noon.   ezetimibe  (ZETIA ) 10 MG tablet Take 1 tablet (10 mg total) by mouth daily.   gabapentin  (NEURONTIN ) 300 MG capsule TAKE 1 CAPSULE EVERY MORNING AND EVERY EVENING THEN TAKE 2 CAPSULES AT BEDTIME   hydrALAZINE  (APRESOLINE ) 100 MG tablet TAKE 1 TABLET TWICE A DAY   irbesartan  (AVAPRO ) 300 MG tablet Take 1 tablet (300 mg total) by mouth daily.   levothyroxine  (SYNTHROID ) 25 MCG tablet Take 1 tablet (25 mcg total) by mouth daily before breakfast.   Melatonin 10 MG TABS Take 10 mg by mouth at bedtime.   Omega-3 Fatty Acids (FISH OIL) 1000 MG CAPS Take 4,000 mg by mouth daily.   oxyCODONE-acetaminophen  (PERCOCET/ROXICET) 5-325 MG tablet Take 1 tablet by mouth every 6 (six) hours as needed.   spironolactone  (ALDACTONE ) 25 MG tablet TAKE 1 TABLET(25 MG) BY MOUTH DAILY   vitamin B-12 (CYANOCOBALAMIN ) 500 MCG tablet Take 500 mcg by mouth daily.   vitamin C (ASCORBIC ACID) 500 MG tablet Take 500 mg by mouth daily.   [DISCONTINUED] levothyroxine  (SYNTHROID ) 25 MCG tablet Take 1 tablet (25 mcg total) by mouth daily before breakfast.   No facility-administered encounter medications on file as of 02/11/2024.   ALLERGIES: Allergies  Allergen Reactions   Atenolol Swelling   Amlodipine Swelling    VACCINATION STATUS: Immunization History  Administered Date(s) Administered   Fluad Trivalent(High Dose 65+) 09/27/2022   INFLUENZA, HIGH DOSE SEASONAL PF 11/17/2023   Influenza,inj,Quad PF,6+ Mos 10/13/2020, 10/22/2021    Moderna Covid-19 Vaccine Bivalent Booster 41yrs & up 07/07/2020, 10/16/2020   Moderna SARS-COV2 Booster Vaccination 07/28/2019   Moderna Sars-Covid-2 Vaccination 11/23/2019   PFIZER(Purple Top)SARS-COV-2 Vaccination 03/12/2019, 04/02/2019   PNEUMOCOCCAL CONJUGATE-20 06/18/2022   Tdap 07/24/2021   Zoster Recombinant(Shingrix ) 07/24/2021, 10/22/2021    HPI Erica Gallegos is 69 y.o. female who presents today with a medical history as above. she is being seen in follow-up after she was seen in consultation for osteoporosis requested by Bevely Doffing, FNP.   She was seen in June 2020 for with a bone density performed in October 2023 which showed a T-score of -2.3 on her hip, -1.7 on femur, and -1.2 on forearms.  Her spine was not included in the study due to degenerative joint disease. Her  bone density in August 2024 showed osteoporosis of left femoral neck-see below. Due to estimated higher fracture risk, after a brief intervention with oral Fosamax , she was switched to Reclast  IV.  She received 2 yearly treatments with IV Reclast  first in October 2024 and second in October 2025.  She has tolerated  these treatments very well.  However, her subsequent CMP measurements showed CKD with GFR between 35-40.   Her previsit labs show normocalcemia, normal PTH. Due to ongoing mild hypercalcemia, during her last visit, she was offered 24-hour urine collection for calcium  measurement.  That test did not show hypercalciuria -24-hour urine calcium  was only 29 mg with 878 mg of urine creatinine.    Review of her medical records as far back as September 2022 she did show mild hypercalcemia.  Her previous studies are not available to review.  She moved to this area 4 years ago from  [place] .  She denies nephrolithiasis, does not report family history of hypercalcemia which would require surgical treatment.  Her highest PTH documented was 72 in November 2024 associated with normal calcium  of 9.3.      Patient thinks she has lost some height, does have medical history of scoliosis (will follow-up with orthopedics) ambulating with a walker for a kilogram.  She is on ongoing vitamin D  supplements.   Denies any prior history of fragility fractures.  Her other medical  problems include hypertension and hyperlipidemia on treatment. Her intake of dairy products is out of average.  She endorses diffuse body aches and neuropathy/radiculopathies.  She is more than 15 years postmenopausal, a former smoker. Her previsit labs also show mild primary hypothyroidism.  During her recent visit, for mild hypothyroidism, she was started on levothyroxine  25 mcg p.o. daily before breakfast.   Review of Systems  Constitutional: + Minimally fluctuating body weight, + fatigue, no subjective hyperthermia, no subjective hypothermia Eyes: no blurry vision, no xerophthalmia   Objective:       02/11/2024   10:34 AM 12/18/2023   10:34 AM 12/09/2023    8:36 AM  Vitals with BMI  Height 5' 6  5' 6  Weight 170 lbs 10 oz  163 lbs  BMI 27.55  26.32  Systolic 132  --  Diastolic 76  --  Pulse 68       Information is confidential and restricted. Go to Review Flowsheets to unlock data.    BP 132/76   Pulse 68   Ht 5' 6 (1.676 m)   Wt 170 lb 9.6 oz (77.4 kg)   BMI 27.54 kg/m   Wt Readings from Last 3 Encounters:  02/11/24 170 lb 9.6 oz (77.4 kg)  12/09/23 163 lb (73.9 kg)  11/17/23 163 lb (73.9 kg)    Physical Exam  Constitutional:  Body mass index is 27.54 kg/m.,  not in acute distress, normal state of mind Eyes: PERRLA, EOMI, no exophthalmos ENT: moist mucous membranes, no gross thyromegaly, no gross cervical lymphadenopathy  Musculoskeletal: + Mild scoliotic deformity of the spine which was not included in the bone density , strength intact in all four extremities   CMP ( most recent) CMP     Component Value Date/Time   NA 140 02/05/2024 1308   K 5.5 (H) 02/05/2024 1308   CL 108 (H)  02/05/2024 1308   CO2 20 02/05/2024 1308   GLUCOSE 80 02/05/2024 1308   GLUCOSE 82 12/04/2021 1106   BUN 25 02/05/2024 1308   CREATININE 1.51 (H) 02/05/2024 1308   CREATININE 1.28 (H) 08/10/2021 1209   CALCIUM  9.6 02/05/2024 1308   PROT 6.3 02/05/2024 1308   ALBUMIN 4.5 02/05/2024 1308   AST 22 02/05/2024 1308   ALT 29 02/05/2024 1308   ALKPHOS 67 02/05/2024 1308   BILITOT 0.4 02/05/2024 1308   GFRNONAA 36 (L) 12/04/2021 1106    Lipid Panel     Component Value Date/Time   CHOL 160 05/15/2023 0920   TRIG 111 05/15/2023 0920   HDL 54 05/15/2023 0920   CHOLHDL 3.0 05/15/2023 0920   CHOLHDL 2.2 08/10/2021 1209   LDLCALC 86 05/15/2023 0920   LDLCALC 56 08/10/2021 1209   LABVLDL 20 05/15/2023 0920      Lab Results  Component Value Date   TSH 2.350 02/05/2024   TSH 1.490 10/22/2023   TSH 2.440 04/10/2023   TSH 1.120 11/20/2022   TSH 0.972 06/20/2022   TSH 1.720 02/26/2022   TSH  1.260 10/22/2021   TSH 1.45 09/21/2020   TSH 1.93 08/28/2020   FREET4 1.14 02/05/2024   FREET4 0.70 (L) 10/22/2023   FREET4 1.20 04/10/2023   FREET4 0.95 11/20/2022   FREET4 0.95 06/20/2022   FREET4 1.00 02/26/2022   FREET4 0.74 (L) 10/22/2021    Bone density latest on August 16, 2022  DualFemur Neck Left 08/16/2022 65.1 Osteoporosis -2.7 0.668 g/cm2 -6.8% Yes DualFemur Neck Left 07/02/2021 64.0 Osteopenia -2.3 0.717 g/cm2 - -   DualFemur Total Mean 08/16/2022 65.1 Osteopenia -1.9 0.764 g/cm2 -3.9% Yes DualFemur Total Mean 07/02/2021 64.0 Osteopenia -1.7 0.795 g/cm2 - -   Left Forearm Radius 33% 08/16/2022 65.1 Osteopenia -1.6 0.596 g/cm2 -4.7% - Left Forearm Radius 33% 07/02/2021 64.0 Osteopenia -1.2 0.626 g/cm2  Assessment & Plan:   1.  Osteoporosis of hip  2.  Hypercalcemia 3.  Hypothyroidism   -I have reviewed her new and available bone density studies as well as her labs. - Based on these reviews, she has has now bone density findings consistent with osteoporosis of hip,  worsening from her prior study in 2023.   Her vertebral spine was not included in the study due to degenerative changes. She also continued to have mild, persistent hypercalcemia.  24-hour urine calcium  was 29 mg / 24 hours against primary hyperparathyroidism.  FHH is not ruled out.  Patient denies any family history of hypercalcemia which  required parathyroidectomy.  No history of nephrolithiasis. Primary hyperparathyroidism is not confirmed at this time.  Her previsit labs show normocalcemia and PTH of 53.  She will be put on expectant management.   She has tolerated IV infusions of Reclast  from October 2024 to October 2025.  Due to persistence of abnormal renal function, she will be switched to Prolia, first treatment to be in October 2026. Her next bone density will be repeated in August  2026.  I  Shared the pros and cons of this treatment options including the fact that when she started Prolia it cannot be stopped for risk of atypical fracture.    Fall precautions discussed with her.  Her dietary intake of protein seems to be adequate.   She has tolerated levothyroxine  25 mcg p.o. daily before breakfast.  Her previsit thyroid  function tests are consistent with appropriate replacement.      - We discussed about the correct intake of her thyroid  hormone, on empty stomach at fasting, with water , separated by at least 30 minutes from breakfast and other medications,  and separated by more than 4 hours from calcium , iron, multivitamins, acid reflux medications (PPIs). -Patient is made aware of the fact that thyroid  hormone replacement is needed for life, dose to be adjusted by periodic monitoring of thyroid  function tests.  She is advised to stay away from calcium  supplements,  however continue on vitamin D  supplements.   - she is advised to maintain close follow up with Bevely Doffing, FNP for primary care needs.  I spent  20  minutes in the care of the patient today including review of  labs from Thyroid  Function, CMP, and other relevant labs ; imaging/biopsy records (current and previous including abstractions from other facilities); face-to-face time discussing  her lab results and symptoms, medications doses, her options of short and long term treatment based on the latest standards of care / guidelines;   and documenting the encounter.  Erica Gallegos  participated in the discussions, expressed understanding, and voiced agreement with the above plans.  All questions were answered to  her satisfaction. she is encouraged to contact clinic should she have any questions or concerns prior to her return visit. Dear Patient: Feel free to review your progress notes.  If you are reviewing this progress note and have questions about the meaning of /or medical terms being used, please make a note and address it at your next follow-up appointment.  Medical notes are meant to be a communication tool between medical professionals and require medical terms to be used for efficiency and insurance approval.   Follow up plan: Return in about 8 months (around 10/10/2024) for Prolia During NV, DXA Scan B4 NV.   Ranny Earl, MD [facility] [address] [place], [place] [address] Phone: [phone]  Fax: [phone]     02/11/2024, 11:06 AM  This note was partially dictated with voice recognition software. Similar sounding words can be transcribed inadequately or may not  be corrected upon review.  "

## 2024-02-12 ENCOUNTER — Ambulatory Visit (HOSPITAL_COMMUNITY): Admitting: Licensed Clinical Social Worker

## 2024-02-12 ENCOUNTER — Ambulatory Visit (HOSPITAL_COMMUNITY)

## 2024-02-19 ENCOUNTER — Ambulatory Visit (HOSPITAL_COMMUNITY): Admitting: Psychiatry

## 2024-02-25 ENCOUNTER — Ambulatory Visit (HOSPITAL_COMMUNITY)

## 2024-03-02 ENCOUNTER — Ambulatory Visit: Admitting: Nurse Practitioner

## 2024-03-04 ENCOUNTER — Ambulatory Visit (HOSPITAL_COMMUNITY): Admitting: Licensed Clinical Social Worker

## 2024-03-25 ENCOUNTER — Ambulatory Visit (HOSPITAL_COMMUNITY): Admitting: Licensed Clinical Social Worker

## 2024-04-02 ENCOUNTER — Ambulatory Visit (HOSPITAL_COMMUNITY)

## 2024-08-24 ENCOUNTER — Other Ambulatory Visit (HOSPITAL_COMMUNITY)

## 2024-10-11 ENCOUNTER — Ambulatory Visit: Admitting: "Endocrinology

## 2024-11-02 ENCOUNTER — Ambulatory Visit

## 2024-12-10 ENCOUNTER — Ambulatory Visit
